# Patient Record
Sex: Male | Born: 1935 | Race: White | Hispanic: No | Marital: Married | Smoking: Former smoker
Health system: Southern US, Community
[De-identification: ages and names within clinical notes are randomized; demographics above are authoritative.]

## PROBLEM LIST (undated history)

## (undated) DIAGNOSIS — D126 Benign neoplasm of colon, unspecified: Secondary | ICD-10-CM

## (undated) DIAGNOSIS — F419 Anxiety disorder, unspecified: Secondary | ICD-10-CM

## (undated) DIAGNOSIS — M4800 Spinal stenosis, site unspecified: Secondary | ICD-10-CM

## (undated) DIAGNOSIS — C801 Malignant (primary) neoplasm, unspecified: Secondary | ICD-10-CM

## (undated) DIAGNOSIS — F4310 Post-traumatic stress disorder, unspecified: Secondary | ICD-10-CM

## (undated) DIAGNOSIS — I8390 Asymptomatic varicose veins of unspecified lower extremity: Secondary | ICD-10-CM

## (undated) DIAGNOSIS — I639 Cerebral infarction, unspecified: Secondary | ICD-10-CM

## (undated) DIAGNOSIS — I350 Nonrheumatic aortic (valve) stenosis: Secondary | ICD-10-CM

## (undated) DIAGNOSIS — K649 Unspecified hemorrhoids: Secondary | ICD-10-CM

## (undated) DIAGNOSIS — Z8619 Personal history of other infectious and parasitic diseases: Secondary | ICD-10-CM

## (undated) DIAGNOSIS — D569 Thalassemia, unspecified: Secondary | ICD-10-CM

## (undated) DIAGNOSIS — I10 Essential (primary) hypertension: Secondary | ICD-10-CM

## (undated) DIAGNOSIS — E785 Hyperlipidemia, unspecified: Secondary | ICD-10-CM

## (undated) DIAGNOSIS — Z860100 Personal history of colon polyps, unspecified: Secondary | ICD-10-CM

## (undated) DIAGNOSIS — K5730 Diverticulosis of large intestine without perforation or abscess without bleeding: Secondary | ICD-10-CM

## (undated) DIAGNOSIS — M353 Polymyalgia rheumatica: Secondary | ICD-10-CM

## (undated) DIAGNOSIS — I809 Phlebitis and thrombophlebitis of unspecified site: Secondary | ICD-10-CM

## (undated) HISTORY — DX: Nonrheumatic aortic (valve) stenosis: I35.0

## (undated) HISTORY — PX: CATARACT EXTRACTION, BILATERAL: SHX1313

## (undated) HISTORY — DX: Unspecified hemorrhoids: K64.9

## (undated) HISTORY — DX: Diverticulosis of large intestine without perforation or abscess without bleeding: K57.30

## (undated) HISTORY — DX: Post-traumatic stress disorder, unspecified: F43.10

## (undated) HISTORY — DX: Asymptomatic varicose veins of unspecified lower extremity: I83.90

## (undated) HISTORY — DX: Phlebitis and thrombophlebitis of unspecified site: I80.9

## (undated) HISTORY — DX: Anxiety disorder, unspecified: F41.9

## (undated) HISTORY — DX: Personal history of colon polyps, unspecified: Z86.0100

## (undated) HISTORY — DX: Personal history of other infectious and parasitic diseases: Z86.19

## (undated) HISTORY — PX: KIDNEY STONE SURGERY: SHX686

## (undated) HISTORY — DX: Polymyalgia rheumatica: M35.3

## (undated) HISTORY — DX: Thalassemia, unspecified: D56.9

## (undated) HISTORY — DX: Spinal stenosis, site unspecified: M48.00

## (undated) HISTORY — DX: Benign neoplasm of colon, unspecified: D12.6

## (undated) HISTORY — DX: Hyperlipidemia, unspecified: E78.5

## (undated) HISTORY — DX: Cerebral infarction, unspecified: I63.9

## (undated) HISTORY — DX: Malignant (primary) neoplasm, unspecified: C80.1

## (undated) HISTORY — DX: Essential (primary) hypertension: I10

## (undated) HISTORY — PX: HEMORROIDECTOMY: SUR656

## (undated) HISTORY — DX: Personal history of colonic polyps: Z86.010

---

## 2001-06-28 ENCOUNTER — Encounter: Payer: Self-pay | Admitting: Internal Medicine

## 2003-06-04 ENCOUNTER — Encounter: Payer: Self-pay | Admitting: Internal Medicine

## 2005-02-03 ENCOUNTER — Encounter: Payer: Self-pay | Admitting: Internal Medicine

## 2005-02-03 DIAGNOSIS — K649 Unspecified hemorrhoids: Secondary | ICD-10-CM

## 2005-02-03 DIAGNOSIS — K5730 Diverticulosis of large intestine without perforation or abscess without bleeding: Secondary | ICD-10-CM

## 2005-02-03 HISTORY — DX: Diverticulosis of large intestine without perforation or abscess without bleeding: K57.30

## 2005-02-03 HISTORY — DX: Unspecified hemorrhoids: K64.9

## 2005-03-01 ENCOUNTER — Encounter: Payer: Self-pay | Admitting: Internal Medicine

## 2006-10-05 ENCOUNTER — Ambulatory Visit: Payer: Self-pay | Admitting: *Deleted

## 2006-11-27 ENCOUNTER — Emergency Department (HOSPITAL_COMMUNITY): Admission: EM | Admit: 2006-11-27 | Discharge: 2006-11-28 | Payer: Self-pay | Admitting: Emergency Medicine

## 2007-03-23 ENCOUNTER — Telehealth: Payer: Self-pay | Admitting: Internal Medicine

## 2007-08-09 ENCOUNTER — Ambulatory Visit: Payer: Self-pay | Admitting: *Deleted

## 2008-04-23 LAB — HEMOGLOBIN A1C: Hgb A1c MFr Bld: 6.3 % — AB (ref 4.0–6.0)

## 2008-05-23 ENCOUNTER — Encounter: Admission: RE | Admit: 2008-05-23 | Discharge: 2008-05-23 | Payer: Self-pay | Admitting: Orthopedic Surgery

## 2008-08-14 ENCOUNTER — Ambulatory Visit: Payer: Self-pay | Admitting: *Deleted

## 2009-04-06 ENCOUNTER — Ambulatory Visit: Payer: Self-pay | Admitting: Internal Medicine

## 2009-04-06 DIAGNOSIS — R142 Eructation: Secondary | ICD-10-CM

## 2009-04-06 DIAGNOSIS — R143 Flatulence: Secondary | ICD-10-CM

## 2009-04-06 DIAGNOSIS — R1013 Epigastric pain: Secondary | ICD-10-CM

## 2009-04-06 DIAGNOSIS — R141 Gas pain: Secondary | ICD-10-CM

## 2009-04-06 DIAGNOSIS — R110 Nausea: Secondary | ICD-10-CM | POA: Insufficient documentation

## 2009-04-06 DIAGNOSIS — K219 Gastro-esophageal reflux disease without esophagitis: Secondary | ICD-10-CM | POA: Insufficient documentation

## 2009-07-31 ENCOUNTER — Ambulatory Visit: Payer: Self-pay | Admitting: Vascular Surgery

## 2010-04-21 NOTE — Assessment & Plan Note (Signed)
Summary: EPIGASTRIC PAIN/YF   History of Present Illness Visit Type: consult  Primary GI MD: Yancey Flemings MD Primary Provider: Barry Dienes. Eloise Harman, MD  Requesting Provider: Barry Dienes. Eloise Harman, MD  Chief Complaint: loss of appetite, acid reflux, and nausea  History of Present Illness:   77 year old white male with multiple medical problems including diabetes mellitus, hypertension, hyperlipidemia, spinal stenosis, anxiety/depression, irritable bowel syndrome, GERD, and nephrolithiasis. She has had an extensive previous GI history and multiple valuations while living in [place]. I have reviewed his extensive medical records. His last colonoscopy was performed in 2006 revealed a hyperplastic polyp only. His last upper endoscopy in 2005 revealed mild esophagitis. 4 GERD she is on AcipHex every other day. He is accompanied today by his wife who participates in the encounter. Patient's GI review of systems is essentially positive. He tells me that he has posttraumatic stress disorder and tells me that he feeds he might develop nausea and the sensation that he needs to belch. If he is unable to belch, he develops anxiety.Marland Kitchen He reports sensation of abdominal discomfort in the epigastric region, but he is vague. He says that he has had this for decades without change. He has problems with eating and increased intestinal gas about once per week. Last episode one week ago. He says he can have crying spells. He wants something to help his stomach. He states he has been on Wellbutrin which has not helped. He denies dysphagia or weight loss.Marland Kitchen His chronic medical problems are reported to be stable.   GI Review of Systems    Reports abdominal pain, acid reflux, belching, bloating, loss of appetite, and  nausea.     Location of  Abdominal pain: epigastric discomfort.    Denies chest pain, dysphagia with liquids, dysphagia with solids, heartburn, vomiting, vomiting blood, weight loss, and  weight gain.      Reports  constipation, diarrhea, hemorrhoids, irritable bowel syndrome, and  rectal bleeding.     Denies anal fissure, black tarry stools, change in bowel habit, diverticulosis, fecal incontinence, heme positive stool, jaundice, light color stool, liver problems, and  rectal pain.    Current Medications (verified): 1)  Aciphex 20 Mg Tbec (Rabeprazole Sodium) .Marland Kitchen.. 1 Tablet By Mouth Once Daily 2)  Actos 30 Mg Tabs (Pioglitazone Hcl) .Marland Kitchen.. 1 Tablet By Mouth Once Daily 3)  Aspirin 325 Mg Tabs (Aspirin) .Marland Kitchen.. 1 Tablet By Mouth Two Times A Day 4)  Colace 100 Mg Caps (Docusate Sodium) .... 2 Capsules By Mouth Once Daily 5)  Glucophage 1000 Mg Tabs (Metformin Hcl) .... 2 Tablets By Mouth Once Daily 6)  Lotrel 5-20 Mg Caps (Amlodipine Besy-Benazepril Hcl) .Marland Kitchen.. 1 Capsule By Mouth Once Daily 7)  Magnesium 500 Mg Tabs (Magnesium) .Marland Kitchen.. 1 Tablet By Mouth Once Daily 8)  Multivitamins  Tabs (Multiple Vitamin) .Marland Kitchen.. 1 Tablet By Mouth Once Daily 9)  Tricor 48 Mg Tabs (Fenofibrate) .Marland Kitchen.. 1 Tablet By Mouth Once Daily 10)  Wellbutrin 100 Mg Tabs (Bupropion Hcl) .... One Tablet By Mouth Once Daily 11)  Zocor 40 Mg Tabs (Simvastatin) .... One Tablet By Mouth Once Daily  Allergies (verified): 1)  ! * Lamisil  Past History:  Past Medical History: Hx of Lyme's Disease Rt. Tibia Fx. Diabetes Hypertension Hyperlipidemia Deafness Lt. Ear Severe Spinal Stenosis DDD C-Spine Hemorrhoids Nephrolithiasis Depression Pneumonia DVT GI Bleed Irritable Bowel Syndrome Anemia Anxiety Disorder  Past Surgical History: Reviewed history from 04/03/2009 and no changes required. Hemorrhoid Surgery Kidney Stone Surgery  Family History: No  FH of Colon Cancer:  Social History: Occupation: Retired Married Patient currently smokes: Cigar daily Alcohol Use - no Daily Caffeine Use: 2 daily  Illicit Drug Use - no Smoking Status:  current Drug Use:  no  Review of Systems       anxiety, arthritis, back pain, cataracts,  depression, skin rash. All other systems reviewed and reported to be negative.  Vital Signs:  Patient profile:   77 year old male Height:      67 inches Weight:      181 pounds BSA:     1.94 Pulse rate:   64 / minute Pulse rhythm:   regular BP sitting:   124 / 72  (left arm) Cuff size:   regular  Vitals Entered By: Ok Anis CMA (April 06, 2009 2:40 PM)  Physical Exam  General:  Well developed, well nourished, no acute distress. Head:  Normocephalic and atraumatic. Eyes:  PERRLA, no icterus. Ears:  hearing aids Nose:  No deformity, discharge,  or lesions. Mouth:  No deformity or lesions,  Neck:  Supple; no masses or thyromegaly. Lungs:  Clear throughout to auscultation. Heart:  Regular rate and rhythm; no murmurs, rubs,  or bruits. Abdomen:  Soft, nontender and nondistended. No masses, hepatosplenomegaly or hernias noted. Normal bowel sounds. Msk:  Symmetrical with no gross deformities. Normal posture. Pulses:  Normal pulses noted. Extremities:  No clubbing, cyanosis, edema or deformities noted. Neurologic:  Alert and  oriented x4;  grossly normal neurologically. Skin:  Intact without significant lesions or rashes. Psych:  Alert and cooperative. Normal mood and affect.   Impression & Recommendations:  Problem # 1:  FLATULENCE-GAS-BLOATING (ICD-47.50) 77 year old with chronic functional complaints who presents today at with intermittent nausea associated with gas and anxiety produced by the inability to belch.  Plan: #1. Librax one b.i.d. p.r.n. #2. Reassurance  Problem # 2:  GERD (ICD-530.81) GERD with endoscopic evidence of esophagitis on prior endoscopy.  Plan: #1. AcipHex 20 mg daily #2. Reflux precautions  Problem # 3:  NAUSEA (ICD-787.02) nausea is other functional or secondary to GERD. For Librax and more regular AcipHex as mentioned above. If symptoms persist despite this, consider upper endoscopy.  Problem # 4:  ABDOMINAL PAIN-EPIGASTRIC  (ICD-789.06) vague abdominal discomfort. Likely functional. Recommend Librax  Patient Instructions: 1)  Librax 1 by mouth two times a day #100 x 2 RFS Rx. printed and given to patient. 2)  Please schedule a follow-up appointment as needed.  3)  The medication list was reviewed and reconciled.  All changed / newly prescribed medications were explained.  A complete medication list was provided to the patient / caregiver. 4)  Dr. Jarome Matin Prescriptions: LIBRAX 2.5-5 MG CAPS (CLIDINIUM-CHLORDIAZEPOXIDE) 1 by mouth two times a day as needed  #100 x 2    Entered by:   Milford Cage NCMA   Authorized by:   Hilarie Fredrickson MD   Signed by:   Milford Cage NCMA on 04/06/2009   Method used:   Print then Give to Patient   RxID:   [id number]

## 2010-04-28 ENCOUNTER — Encounter: Payer: Self-pay | Admitting: Cardiology

## 2010-04-28 DIAGNOSIS — M4800 Spinal stenosis, site unspecified: Secondary | ICD-10-CM | POA: Insufficient documentation

## 2010-04-28 DIAGNOSIS — F4310 Post-traumatic stress disorder, unspecified: Secondary | ICD-10-CM | POA: Insufficient documentation

## 2010-04-28 DIAGNOSIS — Z8619 Personal history of other infectious and parasitic diseases: Secondary | ICD-10-CM | POA: Insufficient documentation

## 2010-04-28 DIAGNOSIS — E119 Type 2 diabetes mellitus without complications: Secondary | ICD-10-CM | POA: Insufficient documentation

## 2010-04-28 DIAGNOSIS — E785 Hyperlipidemia, unspecified: Secondary | ICD-10-CM | POA: Insufficient documentation

## 2010-04-28 DIAGNOSIS — D569 Thalassemia, unspecified: Secondary | ICD-10-CM | POA: Insufficient documentation

## 2010-04-28 DIAGNOSIS — I10 Essential (primary) hypertension: Secondary | ICD-10-CM | POA: Insufficient documentation

## 2010-05-28 ENCOUNTER — Encounter (INDEPENDENT_AMBULATORY_CARE_PROVIDER_SITE_OTHER): Payer: Medicare Other

## 2010-05-28 DIAGNOSIS — I872 Venous insufficiency (chronic) (peripheral): Secondary | ICD-10-CM

## 2010-06-02 NOTE — Procedures (Unsigned)
DUPLEX DEEP VENOUS EXAM - LOWER EXTREMITY  INDICATION:  Chronic venous insufficiency.  HISTORY:  Edema:  No. Trauma/Surgery:  No. Pain:  Yes. PE:  No. Previous DVT:  No. Anticoagulants:  325 mg aspirin x1 day. Other:  DUPLEX EXAM:               CFV   SFV   PopV  PTV    GSV               R  L  R  L  R  L  R   L  R  L Thrombosis    o  o     o     o      o     o Spontaneous   +  +     +     +      +     + Phasic        +  +     +     +      +     + Augmentation  +  +     +     +      +     + Compressible  +  +     +     +      +     + Competent     +  +     +     +      +     o  Legend:  + - yes  o - no  p - partial  D - decreased  IMPRESSION:  No evidence of acute deep vein thrombosis or superficial thrombophlebitis within the left lower extremity.  Venous insufficiency within the left greater saphenous in the deep venous system.   _____________________________ Larina Earthly, M.D.  OD/MEDQ  D:  05/28/2010  T:  05/28/2010  Job:  [id number]

## 2010-06-17 ENCOUNTER — Ambulatory Visit: Payer: Self-pay | Admitting: Cardiology

## 2010-06-22 ENCOUNTER — Ambulatory Visit (INDEPENDENT_AMBULATORY_CARE_PROVIDER_SITE_OTHER): Payer: Medicare Other | Admitting: Cardiology

## 2010-06-22 DIAGNOSIS — I10 Essential (primary) hypertension: Secondary | ICD-10-CM

## 2010-06-22 DIAGNOSIS — E119 Type 2 diabetes mellitus without complications: Secondary | ICD-10-CM

## 2010-06-23 ENCOUNTER — Encounter: Payer: Self-pay | Admitting: Cardiology

## 2010-06-23 NOTE — Progress Notes (Signed)
Subjective:    Antonio Buchanan is seen today with his wife. Overall, he continues to do well. His main problem is related to leg discomfort from his spinal stenosis. He exercises regularly and is proud of his upper body strength. He has a history of diabetes as well as lipid abnormalities. He has underlying hypertension as been well-controlled at home. He has no specific complaint other than left leg pain and weakness.pressure readings at home are 120/68 Current Outpatient Prescriptions  Medication Sig Dispense Refill  . amLODipine-benazepril (LOTREL) 5-10 MG per capsule Take 1 capsule by mouth daily.        Marland Kitchen aspirin 325 MG tablet Take 325 mg by mouth 2 (two) times daily.        Tery Sanfilippo Calcium (STOOL SOFTENER PO) Take by mouth daily.        . fenofibrate (TRICOR) 145 MG tablet Take 145 mg by mouth daily.        . fish oil-omega-3 fatty acids 1000 MG capsule Take 1 g by mouth daily.        Marland Kitchen MAGNESIUM PO Take by mouth daily.        . metFORMIN (GLUCOPHAGE) 1000 MG tablet Take 2,000 mg by mouth daily.        . Multiple Vitamin (MULTIVITAMIN) tablet Take 1 tablet by mouth daily.        . Multiple Vitamins-Minerals (ZINC PO) Take by mouth daily.        . pioglitazone (ACTOS) 30 MG tablet Take 30 mg by mouth daily.        . Psyllium (METAMUCIL PO) Take by mouth daily.        . Pyridoxine HCl (VITAMIN B6 PO) Take by mouth daily.        . simvastatin (ZOCOR) 40 MG tablet Take 20 mg by mouth at bedtime.          Not on File  Patient Active Problem List  Diagnoses  . GERD  . NAUSEA  . FLATULENCE-GAS-BLOATING  . ABDOMINAL PAIN-EPIGASTRIC  . Diabetes mellitus  . HTN (hypertension)  . Hyperlipemia  . Spinal stenosis  . Thalassemia  . History of Lyme disease  . PTSD (post-traumatic stress disorder)    History  Smoking status  . Not on file  Smokeless tobacco  . Not on file    History  Alcohol Use     Family History  Problem Relation Age of Onset  . Diabetes Father   . Leukemia  Mother   . Hypertension Mother   . Arthritis Mother     Review of Systems:   The patient denies any heat or cold intolerance.  No weight gain or weight loss.  The patient denies headaches or blurry vision.  There is no cough or sputum production.  The patient denies dizziness.  There is no hematuria or hematochezia.  The patient denies any muscle aches or arthritis.  The patient denies any rash.  The patient denies frequent falling or instability.  There is no history of depression or anxiety.  All other systems were reviewed and are negative.   Physical Exam:   Weight is 182. Blood pressure 158/88. Heart rate 76.The head is normocephalic and atraumatic.  Pupils are equally round and reactive to light.  Sclerae nonicteric.  Conjunctiva is clear.  Oropharynx is unremarkable.  There's adequate oral airway.  Neck is supple there are no masses.  Thyroid is not enlarged.  There is no lymphadenopathy.  Lungs are clear.  Chest is symmetric.  Heart shows a regular rate and rhythm.  S1 and S2 are normal.  There is no murmur click or gallop.  Abdomen is soft normal bowel sounds.  There is no organomegaly.  Genital and rectal deferred.  Extremities are without edema.  Peripheral pulses are adequate.  Neurologically intact.  Full range of motion.  The patient is not depressed.  Skin is warm and dry. Assessment / Plan:

## 2010-06-23 NOTE — Assessment & Plan Note (Signed)
Pressure readings are satisfactory. I will have continued followup with Dr. Eloise Harman

## 2010-06-23 NOTE — Assessment & Plan Note (Signed)
Hemoglobin A1c was 6.2. Continue follow with Dr. Jarold Motto.

## 2010-07-09 ENCOUNTER — Encounter: Payer: Self-pay | Admitting: Cardiology

## 2010-08-03 NOTE — Assessment & Plan Note (Signed)
OFFICE VISIT   TRONG, GOSLING  DOB:  01-08-1922                                       07/31/2009  EAVWU#:[id number]   He was formerly a patient of Dr. Madilyn Fireman and has not seen a physician  since.  Reason for followup is chronic venous insufficiency.   The patient is an 77 year old Caucasian male who is here today for  yearly followup for his history of bilateral lower extremity chronic  venous insufficiency, worse on the left leg.  He has been seen on a  yearly basis at least since 2008 and had been followed by Dr. Denman George who recently relocated.  The patient reports that in November of  1983 he had a crush injury to his left lower extremity and in January  the following year he had significant left leg ulcers which have since  healed.  He has a fair amount of pain in his legs particularly on the  left but reports that he consistently wears his knee high compression  stockings at 20-30 mmHg which do help with his symptoms.  He has had no  new ulcerations or erythema.   PAST MEDICAL HISTORY:  Significant for diabetes mellitus type 2,  hypertension, coronary artery disease with history of myocardial  infarction.  He is followed by Dr. Roger Shelter.  Hypercholesterolemia, two recent cataract surgeries this year,  hemorrhoid surgery in the '60s, nephrolithiasis, staph infection of his  left lower extremity in 1984, embedded shrapnel in 1944 during World War  II.   FAMILY HISTORY:  He denies any premature cardiovascular disease in his  family.   SOCIAL HISTORY:  He is married with two children.  He previously worked  as a Research scientist (physical sciences).  He quit smoking in 1995 and does not drink alcohol  on a regular basis.   REVIEW OF SYSTEMS:  Positive for pain in his legs with walking and pain  in his left foot with lying flat.  He has arthritic pain and post  traumatic stress disorder from World War II, problems with anxiety and  depression.  He  has also has a history of thalassemia.   PHYSICAL EXAM:  General:  Findings show an 77 year old male who appears  his stated age.  Vital signs:  His heart rate is 63, blood pressure in  his right arm 160/84, in his left arm 163/79, respirations are 20.  He  is alert and oriented.  Heart:  His heart has a regular rate and rhythm.  Lungs:  Sounds were clear bilaterally.  Abdomen:  Was soft, nontender.  Extremities:  His bilateral lower extremities show warm feet with 1+  dorsalis pedis pulses bilaterally.  He has bilateral lower extremity  varicosities but more severe in the left leg with a particularly large  varicosity running on his medial calf on the left.  There was no  erythema on either leg.  He has chronic pigmentation to bilateral lower  extremities with some dry skin as well.  Brawny edema was noted.  There  were no ulcerations appreciated.  His left lower extremity has greater  generalized edema than on the right.   The patient continues to suffer with chronic venous insufficiency of his  lower extremities with moderate symptoms in his right leg and at least  moderate to severe symptoms in his left leg.  He remains significantly  disabled with his above symptoms and this continues to limit his  employment ability.  I recommend that he continue to use his compression  stockings and to avoid prolonged standing and elevate his legs while in  bed or in a reclined position.  We will plan for him to be seen again in  1 year.  He should call sooner if worsening symptoms or symptoms of  cellulitis develop.   Jerold Coombe, PA   Larina Earthly, M.D.  Electronically Signed   AWZ/MEDQ  D:  07/31/2009  T:  07/31/2009  Job:  [id number]   cc:   Barry Dienes. Eloise Harman, M.D.  Workman's Comp

## 2010-08-03 NOTE — Assessment & Plan Note (Signed)
OFFICE VISIT   Antonio Buchanan, Antonio Buchanan  DOB:  01-Sep-1921                                       08/09/2007  [id number]#:[id number]   The patient is an 77 year old gentleman with a history of bilateral  lower extremity chronic venous insufficiency.  He was seen and evaluated  in this office on 10/05/2006 and was felt to be disabled secondary  bilateral chronic venous insufficiency of the lower extremities.  He was  given a prescription for compression stockings 20-30 mmHg below-knee  bilaterally.   Chronic underlying conditions include type 2 diabetes, hypertension and  hyperlipidemia.   The patient suffers from post-lytic syndrome most severely effecting his  left lower extremity.  He is taking good care of his legs with venous  compression stockings and hygienic care.  He has a history of DVT left  leg.   He is an 77 year old gentleman alert and oriented.  Appears his stated  age.  No acute distress.  BP 156/83, pulse 63 per minute regular,  respirations 18 per minute.  His lower extremities reveal intact  popliteal and posterior tibial pulses.  He does have extensive  varicosities noted bilaterally.  Mild pigmentation of the right lower  leg.  Moderate pigmentation left lower leg with some brawny edema and  chronic skin changes.   In summary, the patient suffers from chronic venous insufficiency of the  lower extremities with moderate to severe left leg and moderate right  leg.  These remain significantly disabling and limit his employment  ability.   He will continue current level of care and return to the office in 1  year.   Balinda Quails, M.D.  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 of Labor

## 2010-08-03 NOTE — Assessment & Plan Note (Signed)
OFFICE VISIT   PACE, LAMADRID  DOB:  Jun 11, 1921                                       08/14/2008  [id number]#:[id number]   The patient returned to the office today for yearly follow-up with a  history of bilateral lower extremity chronic venous insufficiency.  He  is seen on a yearly basis since 2008.  He remains on long-term  disability secondary to bilateral chronic venous insufficiency of the  lower extremities.  He continues to wear compression stockings at 20-30  mmHg below the knee bilaterally.   PAST MEDICAL HISTORY:  Type 2 diabetes, hypertension and hyperlipidemia.   He is an 77 year old gentleman who appears approximately his stated age.  Alert and oriented.  No distress.  Blood pressure 176/75, pulse 66 per  minute.  His lower extremities reveal intact popliteal and posterior  tibial pulses.  He has extensive varicosities noted bilaterally, most  severely affecting the left leg.  He has chronic pigmentation of the  lower extremities bilaterally.  Brawny edema noted and chronic skin  changes.  No active ulceration.   The patient has continued problems with chronic venous insufficiency and  remains disabled related to this.  Will plan follow-up again in 1 year.   Balinda Quails, M.D.  Electronically Signed   PGH/MEDQ  D:  08/14/2008  T:  08/15/2008  Job:  [id number]   cc:   Workman's Comp Programs

## 2010-08-03 NOTE — Consult Note (Signed)
VASCULAR SURGERY CONSULTATION   Antonio Buchanan, Antonio Buchanan  DOB:  09-04-1921                                       10/05/2006  [id number]#:[id number]   The referring physician is Antonio Buchanan. Antonio Buchanan, M.D., [name], [place].   REASON FOR CONSULTATION:  Chronic venous insufficiency bilateral lower  extremities.   HISTORY:  Mr. Antonio Buchanan has a history of longstanding chronic venous  insufficiency dating back several years.  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  This has  been reasonably controlled with venous compression stocking and good  hygienic care.   His history dates back to a DVT involving his left leg.  He wears a  venous compression stocking religiously.   PAST MEDICAL HISTORY:  1. Type 2 diabetes.  2. Hypertension.  3. Hyperlipidemia.   SOCIAL HISTORY:  The patient is married with 2 grown children.  Relocated here to [facility] to be with his children and grandchildren.  Does not smoke.  Discontinued tobacco use in 1997.  No alcohol use on a  regular basis.   REVIEW OF SYSTEMS:  Denies weight loss or anorexia.  No chest pain or  shortness of breath.  No cough or sputum production.  Denies change in  bowel habits.  He does have chronic pain in his legs.  Chronic joint  discomfort and muscle pain.   MEDICATIONS:  1. Metformin 2 g daily.  2. Lotrel 5/10 daily.  3. Zocor 40 mg daily.  4. Actos 30 mg daily.  5. TriCor 48 mg daily.  6. Aspirin 325 mg daily.   ALLERGIES:  None known.   FAMILY HISTORY:  Noncontributory.   PHYSICAL EXAMINATION:  An 77 year old male, appears approximately his  stated age, alert and oriented.  Vital Signs:  The BP 161/82, pulse 56  per minute.  Respirations 18 per minute.  Lower Extremities:  Intact  posterior tibial and pedal pulses bilaterally.  Feet are well perfused  with brisk capillary refill.  There is pigmentation noted about the  ankles bilaterally.  There  is trophic skin changes more severely  affecting the left leg.  No active ulceration.  Mild dry skin.  Varicosities are noted 7-10 mm in diameter.  No erythema.   IMPRESSION:  1. Longstanding chronic venous insufficiency secondary to remote deep      vein thrombosis.  2. Type 2 diabetes.  3. Hypertension.  4. Hyperlipidemia.   DISPOSITION:  The patient remains disabled secondary to chronic venous  insufficiency.  Will plan yearly followup.  Patient given a prescription  for new venous compression stocking to be refitted at 20-30 mmHg  bilaterally below the knee.   Antonio Buchanan, M.D.  Electronically Signed  PGH/MEDQ  D:  10/05/2006  T:  10/06/2006  Job:  [id number]   cc:   [employer]

## 2010-08-10 ENCOUNTER — Ambulatory Visit (INDEPENDENT_AMBULATORY_CARE_PROVIDER_SITE_OTHER): Payer: Medicare Other | Admitting: Vascular Surgery

## 2010-08-10 DIAGNOSIS — I872 Venous insufficiency (chronic) (peripheral): Secondary | ICD-10-CM

## 2010-08-10 NOTE — Assessment & Plan Note (Signed)
OFFICE VISIT  KELVON, GIANNINI DOB:  1922-01-24                                       08/10/2010 [id number]#:[id number]  Patient presents today for determination of ongoing disability.  This is a formality for an office visit.  He has had a longstanding disability due to chronic uncorrectable venous hypertension and apparently needs to see a physician on a yearly basis for [facility] Comp documentation.  He continues to be quite active despite his age of 52.  He has had no change from the standpoint of his venous hypertension and venous stasis disease.  This is my last visit with him.  I have reviewed his reports from the last several years.  He did undergo a duplex in our office on 05/28/10 due to some pain in his left thigh and was concerned about DVT.  This study was negative for DVT.  He does continue to have very good compliance with his graduated compression garment the left leg.  He does have changes of venous hypertension but no active ulcers.  I feel that he has had no change in his status of disability related to his chronic venous stasis disease, and he will see [place] on an as-needed basis.    Larina Earthly, M.D. Electronically Signed  TFE/MEDQ  D:  08/10/2010  T:  08/10/2010  Job:  [id number]

## 2011-02-24 ENCOUNTER — Ambulatory Visit (INDEPENDENT_AMBULATORY_CARE_PROVIDER_SITE_OTHER): Payer: Medicare Other | Admitting: Cardiology

## 2011-02-24 ENCOUNTER — Encounter: Payer: Self-pay | Admitting: Cardiology

## 2011-02-24 VITALS — BP 143/82 | HR 63 | Wt 176.0 lb

## 2011-02-24 DIAGNOSIS — E785 Hyperlipidemia, unspecified: Secondary | ICD-10-CM

## 2011-02-24 DIAGNOSIS — F17200 Nicotine dependence, unspecified, uncomplicated: Secondary | ICD-10-CM

## 2011-02-24 DIAGNOSIS — R011 Cardiac murmur, unspecified: Secondary | ICD-10-CM | POA: Insufficient documentation

## 2011-02-24 DIAGNOSIS — I10 Essential (primary) hypertension: Secondary | ICD-10-CM

## 2011-02-24 DIAGNOSIS — Z720 Tobacco use: Secondary | ICD-10-CM | POA: Insufficient documentation

## 2011-02-24 NOTE — Assessment & Plan Note (Signed)
Probable aortic stenosis murmur on examination. Scheduled echocardiogram.

## 2011-02-24 NOTE — Assessment & Plan Note (Signed)
Continue statin. Lipids and liver monitored by primary care. 

## 2011-02-24 NOTE — Progress Notes (Signed)
HPI:77 yo male formerly followed by Dr Deborah Chalk for fu of hypertension. Echo in 2003 showed normal LV function. Myoview in 2008 showed EF 59 and normal perfusion. Patient last seen in April of 2012. Since then, the patient denies any dyspnea on exertion, orthopnea, PND, palpitations, syncope or chest pain. Some pedal edema from venous insufficiency.   Current Outpatient Prescriptions  Medication Sig Dispense Refill  . amLODipine-benazepril (LOTREL) 5-10 MG per capsule Take 1 capsule by mouth daily.        Marland Kitchen aspirin 325 MG tablet Take 325 mg by mouth 2 (two) times daily.        Marland Kitchen atenolol (TENORMIN) 25 MG tablet Take 25 mg by mouth as needed.        Marland Kitchen buPROPion (WELLBUTRIN) 100 MG tablet Take 100 mg by mouth daily.        . clonazePAM (KLONOPIN) 1 MG tablet Take 1 mg by mouth as needed.        Tery Sanfilippo Calcium (STOOL SOFTENER PO) Take by mouth daily.        . fenofibrate (TRICOR) 48 MG tablet Take 48 mg by mouth daily.        . fish oil-omega-3 fatty acids 1000 MG capsule Take 1 g by mouth daily.        Marland Kitchen MAGNESIUM PO Take by mouth daily.        . metFORMIN (GLUCOPHAGE) 1000 MG tablet Take 2,000 mg by mouth daily.        . Multiple Vitamin (MULTIVITAMIN) tablet Take 1 tablet by mouth daily.        . Multiple Vitamins-Minerals (ZINC PO) Take by mouth daily.        . Pyridoxine HCl (VITAMIN B6 PO) Take by mouth daily.        . RABEprazole (ACIPHEX) 20 MG tablet Take 20 mg by mouth daily.        . simvastatin (ZOCOR) 40 MG tablet Take 20 mg by mouth at bedtime.           Past Medical History  Diagnosis Date  . Diabetes mellitus   . HTN (hypertension)   . Hyperlipemia   . Spinal stenosis   . Thalassemia   . History of Lyme disease   . PTSD (post-traumatic stress disorder)     Past Surgical History  Procedure Date  . Cataract extraction, bilateral   . Kidney stone surgery   . Hemorroidectomy     History   Social History  . Marital Status: Married    Spouse Name: N/A   Number of Children: N/A  . Years of Education: N/A   Occupational History  . Not on file.   Social History Main Topics  . Smoking status: Current Everyday Smoker    Types: Cigars  . Smokeless tobacco: Not on file  . Alcohol Use: Not on file  . Drug Use: Not on file  . Sexually Active: Not on file   Other Topics Concern  . Not on file   Social History Narrative  . No narrative on file    ROS: Leg weakness but no fevers or chills, productive cough, hemoptysis, dysphasia, odynophagia, melena, hematochezia, dysuria, hematuria, rash, seizure activity, orthopnea, PND, claudication. Remaining systems are negative.  Physical Exam: Well-developed well-nourished in no acute distress.  Skin is warm and dry.  HEENT is normal.  Neck is supple. No thyromegaly.  Chest is clear to auscultation with normal expansion.  Cardiovascular exam is regular rate and rhythm. 2/6 systolic  murmur left sternal border Abdominal exam nontender or distended. No masses palpated. Extremities show trace edema. neuro grossly intact  ECG Sinus with occasional PVCs, LAD, Nonspecific ST changes

## 2011-02-24 NOTE — Patient Instructions (Signed)
Your physician has requested that you have an echocardiogram. Echocardiography is a painless test that uses sound waves to create images of your heart. It provides your doctor with information about the size and shape of your heart and how well your heart's chambers and valves are working. This procedure takes approximately one hour. There are no restrictions for this procedure.  Your physician wants you to follow-up in: 12 months You will receive a reminder letter in the mail two months in advance. If you don't receive a letter, please call our office to schedule the follow-up appointment.  

## 2011-02-24 NOTE — Assessment & Plan Note (Signed)
Patient counseled on avoiding cigar use.

## 2011-02-24 NOTE — Assessment & Plan Note (Signed)
Blood pressure is reasonably well controlled. He is only taking atenolol every other day. I have asked him to take this daily. Potassium and renal function monitored by primary care.

## 2011-03-01 ENCOUNTER — Ambulatory Visit (HOSPITAL_COMMUNITY): Payer: Medicare Other | Attending: Cardiology | Admitting: Radiology

## 2011-03-01 DIAGNOSIS — I079 Rheumatic tricuspid valve disease, unspecified: Secondary | ICD-10-CM | POA: Insufficient documentation

## 2011-03-01 DIAGNOSIS — R011 Cardiac murmur, unspecified: Secondary | ICD-10-CM | POA: Insufficient documentation

## 2011-03-01 DIAGNOSIS — F17200 Nicotine dependence, unspecified, uncomplicated: Secondary | ICD-10-CM | POA: Insufficient documentation

## 2011-03-01 DIAGNOSIS — E785 Hyperlipidemia, unspecified: Secondary | ICD-10-CM | POA: Insufficient documentation

## 2011-03-01 DIAGNOSIS — I10 Essential (primary) hypertension: Secondary | ICD-10-CM | POA: Insufficient documentation

## 2011-03-01 DIAGNOSIS — I359 Nonrheumatic aortic valve disorder, unspecified: Secondary | ICD-10-CM | POA: Insufficient documentation

## 2011-03-01 DIAGNOSIS — E119 Type 2 diabetes mellitus without complications: Secondary | ICD-10-CM | POA: Insufficient documentation

## 2011-03-01 DIAGNOSIS — I059 Rheumatic mitral valve disease, unspecified: Secondary | ICD-10-CM | POA: Insufficient documentation

## 2011-05-05 DIAGNOSIS — M48061 Spinal stenosis, lumbar region without neurogenic claudication: Secondary | ICD-10-CM | POA: Diagnosis not present

## 2011-05-05 DIAGNOSIS — K5900 Constipation, unspecified: Secondary | ICD-10-CM | POA: Diagnosis not present

## 2011-05-05 DIAGNOSIS — E1159 Type 2 diabetes mellitus with other circulatory complications: Secondary | ICD-10-CM | POA: Diagnosis not present

## 2011-05-05 DIAGNOSIS — I10 Essential (primary) hypertension: Secondary | ICD-10-CM | POA: Diagnosis not present

## 2011-05-27 DIAGNOSIS — L608 Other nail disorders: Secondary | ICD-10-CM | POA: Diagnosis not present

## 2011-05-27 DIAGNOSIS — E1149 Type 2 diabetes mellitus with other diabetic neurological complication: Secondary | ICD-10-CM | POA: Diagnosis not present

## 2011-07-11 ENCOUNTER — Telehealth: Payer: Self-pay | Admitting: Vascular Surgery

## 2011-07-11 NOTE — Telephone Encounter (Signed)
Antonio Buchanan needs a 1 year appointment to satisfy Workman's Comp - he is still on federal workman's comp, despite the number of years that he has been retired.Marland Kitchen  He needs a short letter generated at this appointment to, basically, say that his status has not changed.  Please schedule with Antonio Buchanan and leave a message with the appointment and date.  They will call back if they need to change the time or date.  According to Mrs. Colvard, Medicare is billed for the appointment charge.  He does not get an ultrasound, each year.

## 2011-08-08 ENCOUNTER — Encounter: Payer: Self-pay | Admitting: Vascular Surgery

## 2011-08-09 ENCOUNTER — Ambulatory Visit (INDEPENDENT_AMBULATORY_CARE_PROVIDER_SITE_OTHER): Payer: Medicare Other | Admitting: Vascular Surgery

## 2011-08-09 ENCOUNTER — Encounter: Payer: Self-pay | Admitting: Vascular Surgery

## 2011-08-09 VITALS — BP 174/74 | HR 64 | Temp 98.3°F | Ht 66.5 in | Wt 176.0 lb

## 2011-08-09 DIAGNOSIS — I87309 Chronic venous hypertension (idiopathic) without complications of unspecified lower extremity: Secondary | ICD-10-CM | POA: Diagnosis not present

## 2011-08-09 NOTE — Progress Notes (Signed)
The patient presents today for documentation for workmen compensation purpose of his ongoing) correctable venous hypertension. He does require physician visit once a year to confirm this. He is quite active. He is now 78 years of age. He reports no new difficulty. He does have marked changes of venous hypertension in his left leg with hemosiderin deposits swelling marked venous varicosities. He does not have any active ulcers but does have very friable skin. He does not have any recent duplexes most recently being a year ago showing no evidence of acute DVT. He does move recently certified as posttraumatic stress disorder secondary to exposure during World War II. He reports no other health problems.  Impression and plan chronic left leg venous hypertension and venous stasis disease. He will continue his use of compression garment. He has very compliant with this and does elevate his leg when possible

## 2011-08-17 NOTE — Telephone Encounter (Signed)
Appt was scheduled.

## 2011-08-18 DIAGNOSIS — I739 Peripheral vascular disease, unspecified: Secondary | ICD-10-CM | POA: Diagnosis not present

## 2011-08-18 DIAGNOSIS — E1159 Type 2 diabetes mellitus with other circulatory complications: Secondary | ICD-10-CM | POA: Diagnosis not present

## 2011-08-18 DIAGNOSIS — I10 Essential (primary) hypertension: Secondary | ICD-10-CM | POA: Diagnosis not present

## 2011-08-18 DIAGNOSIS — L600 Ingrowing nail: Secondary | ICD-10-CM | POA: Diagnosis not present

## 2011-09-15 DIAGNOSIS — L408 Other psoriasis: Secondary | ICD-10-CM | POA: Diagnosis not present

## 2011-09-15 DIAGNOSIS — D239 Other benign neoplasm of skin, unspecified: Secondary | ICD-10-CM | POA: Diagnosis not present

## 2011-09-27 DIAGNOSIS — L608 Other nail disorders: Secondary | ICD-10-CM | POA: Diagnosis not present

## 2011-09-27 DIAGNOSIS — E1149 Type 2 diabetes mellitus with other diabetic neurological complication: Secondary | ICD-10-CM | POA: Diagnosis not present

## 2011-12-02 DIAGNOSIS — Q828 Other specified congenital malformations of skin: Secondary | ICD-10-CM | POA: Diagnosis not present

## 2011-12-02 DIAGNOSIS — Z23 Encounter for immunization: Secondary | ICD-10-CM | POA: Diagnosis not present

## 2011-12-02 DIAGNOSIS — I10 Essential (primary) hypertension: Secondary | ICD-10-CM | POA: Diagnosis not present

## 2011-12-02 DIAGNOSIS — L608 Other nail disorders: Secondary | ICD-10-CM | POA: Diagnosis not present

## 2011-12-02 DIAGNOSIS — E785 Hyperlipidemia, unspecified: Secondary | ICD-10-CM | POA: Diagnosis not present

## 2011-12-02 DIAGNOSIS — E1149 Type 2 diabetes mellitus with other diabetic neurological complication: Secondary | ICD-10-CM | POA: Diagnosis not present

## 2011-12-02 DIAGNOSIS — E1159 Type 2 diabetes mellitus with other circulatory complications: Secondary | ICD-10-CM | POA: Diagnosis not present

## 2011-12-22 DIAGNOSIS — L408 Other psoriasis: Secondary | ICD-10-CM | POA: Diagnosis not present

## 2011-12-22 DIAGNOSIS — L738 Other specified follicular disorders: Secondary | ICD-10-CM | POA: Diagnosis not present

## 2011-12-22 DIAGNOSIS — Z85828 Personal history of other malignant neoplasm of skin: Secondary | ICD-10-CM | POA: Diagnosis not present

## 2012-01-12 DIAGNOSIS — H31009 Unspecified chorioretinal scars, unspecified eye: Secondary | ICD-10-CM | POA: Diagnosis not present

## 2012-01-12 DIAGNOSIS — E119 Type 2 diabetes mellitus without complications: Secondary | ICD-10-CM | POA: Diagnosis not present

## 2012-01-12 DIAGNOSIS — D3130 Benign neoplasm of unspecified choroid: Secondary | ICD-10-CM | POA: Diagnosis not present

## 2012-01-12 DIAGNOSIS — H3530 Unspecified macular degeneration: Secondary | ICD-10-CM | POA: Diagnosis not present

## 2012-02-24 DIAGNOSIS — L608 Other nail disorders: Secondary | ICD-10-CM | POA: Diagnosis not present

## 2012-02-24 DIAGNOSIS — E1149 Type 2 diabetes mellitus with other diabetic neurological complication: Secondary | ICD-10-CM | POA: Diagnosis not present

## 2012-02-24 DIAGNOSIS — M2040 Other hammer toe(s) (acquired), unspecified foot: Secondary | ICD-10-CM | POA: Diagnosis not present

## 2012-03-01 ENCOUNTER — Encounter: Payer: Self-pay | Admitting: Cardiology

## 2012-03-01 ENCOUNTER — Ambulatory Visit (INDEPENDENT_AMBULATORY_CARE_PROVIDER_SITE_OTHER): Payer: Medicare Other | Admitting: Cardiology

## 2012-03-01 VITALS — BP 130/70 | HR 54 | Wt 173.0 lb

## 2012-03-01 DIAGNOSIS — I350 Nonrheumatic aortic (valve) stenosis: Secondary | ICD-10-CM | POA: Insufficient documentation

## 2012-03-01 DIAGNOSIS — E7800 Pure hypercholesterolemia, unspecified: Secondary | ICD-10-CM | POA: Diagnosis not present

## 2012-03-01 DIAGNOSIS — I10 Essential (primary) hypertension: Secondary | ICD-10-CM | POA: Diagnosis not present

## 2012-03-01 DIAGNOSIS — I359 Nonrheumatic aortic valve disorder, unspecified: Secondary | ICD-10-CM

## 2012-03-01 DIAGNOSIS — F17200 Nicotine dependence, unspecified, uncomplicated: Secondary | ICD-10-CM | POA: Diagnosis not present

## 2012-03-01 DIAGNOSIS — Z720 Tobacco use: Secondary | ICD-10-CM

## 2012-03-01 LAB — LIPID PANEL
Cholesterol: 175 mg/dL (ref 0–200)
HDL: 37.6 mg/dL — ABNORMAL LOW (ref >39.00)

## 2012-03-01 LAB — LDL CHOLESTEROL, DIRECT: Direct LDL: 102.9 mg/dL

## 2012-03-01 LAB — HEPATIC FUNCTION PANEL
ALT: 15 U/L (ref 0–53)
Albumin: 4.6 g/dL (ref 3.5–5.2)
Alkaline Phosphatase: 78 U/L (ref 39–117)
Bilirubin, Direct: 0.1 mg/dL (ref 0.0–0.3)
Total Protein: 7.5 g/dL (ref 6.0–8.3)

## 2012-03-01 LAB — BASIC METABOLIC PANEL
BUN: 20 mg/dL (ref 6–23)
Creatinine, Ser: 1.2 mg/dL (ref 0.4–1.5)
GFR: 61.83 mL/min (ref >60.00)

## 2012-03-01 NOTE — Patient Instructions (Addendum)
Your physician wants you to follow-up in: ONE YEAR WITH DR CRENSHAW You will receive a reminder letter in the mail two months in advance. If you don't receive a letter, please call our office to schedule the follow-up appointment.   Your physician recommends that you HAVE LAB WORK TODAY 

## 2012-03-01 NOTE — Assessment & Plan Note (Signed)
Blood pressure controlled. Continue present medications. Check potassium and renal function. 

## 2012-03-01 NOTE — Assessment & Plan Note (Signed)
Mild on most recent echo. Plan repeat study when he returns in one year.

## 2012-03-01 NOTE — Progress Notes (Signed)
HPI: Pleasant male for fu of hypertension. Myoview in 2008 showed EF 59 and normal perfusion. Echocardiogram in December of 2012 showed an ejection fraction of 50-55%, mild left ventricular hypertrophy, grade 1 diastolic dysfunction, mild aortic stenosis (mean gradient 16 mmHg) and insufficiency, mild mitral regurgitation. Patient last seen in Dec 2012. Since then, the patient denies any dyspnea on exertion, orthopnea, PND, palpitations, syncope or chest pain. Some pedal edema from venous insufficiency.   Current Outpatient Prescriptions  Medication Sig Dispense Refill  . amLODipine-benazepril (LOTREL) 5-10 MG per capsule Take 1 capsule by mouth daily.        Marland Kitchen atenolol (TENORMIN) 25 MG tablet Take 25 mg by mouth as needed.        Marland Kitchen buPROPion (WELLBUTRIN) 100 MG tablet 1/2 tab po qd      . clonazePAM (KLONOPIN) 1 MG tablet Take 1 mg by mouth as needed.        Tery Sanfilippo Calcium (STOOL SOFTENER PO) Take by mouth daily.        . fenofibrate (TRICOR) 48 MG tablet 1/2 tab po qd      . fish oil-omega-3 fatty acids 1000 MG capsule Take 1 g by mouth daily.        Marland Kitchen MAGNESIUM PO Take by mouth daily.        . metFORMIN (GLUCOPHAGE) 1000 MG tablet Take 2,000 mg by mouth daily.        . Multiple Vitamin (MULTIVITAMIN) tablet Take 1 tablet by mouth daily.        . Multiple Vitamins-Minerals (ZINC PO) Take by mouth daily.        . Pyridoxine HCl (VITAMIN B6 PO) Take by mouth daily.        . RABEprazole (ACIPHEX) 20 MG tablet Take 20 mg by mouth daily.        . simvastatin (ZOCOR) 40 MG tablet Take 20 mg by mouth at bedtime.           Past Medical History  Diagnosis Date  . Diabetes mellitus   . HTN (hypertension)   . Hyperlipemia   . Spinal stenosis   . Thalassemia   . History of Lyme disease   . PTSD (post-traumatic stress disorder)   . Cancer     left cheek    Past Surgical History  Procedure Date  . Cataract extraction, bilateral   . Kidney stone surgery   . Hemorroidectomy      History   Social History  . Marital Status: Married    Spouse Name: N/A    Number of Children: N/A  . Years of Education: N/A   Occupational History  . Not on file.   Social History Main Topics  . Smoking status: Current Every Day Smoker -- 78 years    Types: Cigars  . Smokeless tobacco: Never Used     Comment: pt states that he does not inhale  . Alcohol Use: No  . Drug Use: No  . Sexually Active: Not on file   Other Topics Concern  . Not on file   Social History Narrative  . No narrative on file    ROS: no fevers or chills, productive cough, hemoptysis, dysphasia, odynophagia, melena, hematochezia, dysuria, hematuria, rash, seizure activity, orthopnea, PND, pedal edema, claudication. Remaining systems are negative.  Physical Exam: Well-developed well-nourished in no acute distress.  Skin is warm and dry.  HEENT is normal.  Neck is supple.  Chest is clear to auscultation with normal expansion.  Cardiovascular exam is regular rate and rhythm. 2/6 systolic murmur left sternal border. S2 is not diminished. Abdominal exam nontender or distended. No masses palpated. Extremities show no edema. neuro grossly intact  ECG sinus rhythm with ventricular bigeminy. First degree AV block. Nonspecific T-wave changes.

## 2012-03-01 NOTE — Assessment & Plan Note (Signed)
Patient counseled on discontinuing. 

## 2012-03-01 NOTE — Assessment & Plan Note (Signed)
Continue statin. Check lipids and liver. 

## 2012-03-02 ENCOUNTER — Encounter: Payer: Self-pay | Admitting: *Deleted

## 2012-03-23 ENCOUNTER — Encounter: Payer: Self-pay | Admitting: *Deleted

## 2012-03-23 ENCOUNTER — Telehealth: Payer: Self-pay | Admitting: Internal Medicine

## 2012-03-23 NOTE — Telephone Encounter (Signed)
Pt states he is constipated. Pt has tried metamucil, Magnesium pills, Colace, Linzess, and miralax. He has had 2 small bm's but he feels like when he strains to go the fecal matter is going back up in him. Pt states he thinks it may be an emotional problem-PTSD. Pt is a Insurance account manager. Requesting to be seen. Pt scheduled to see Mike Gip PA Monday 03/26/12@2 :30pm. Pt aware of appt date and time.

## 2012-03-26 ENCOUNTER — Ambulatory Visit (INDEPENDENT_AMBULATORY_CARE_PROVIDER_SITE_OTHER): Payer: Medicare Other | Admitting: Physician Assistant

## 2012-03-26 ENCOUNTER — Other Ambulatory Visit (INDEPENDENT_AMBULATORY_CARE_PROVIDER_SITE_OTHER): Payer: Medicare Other

## 2012-03-26 ENCOUNTER — Encounter: Payer: Self-pay | Admitting: Physician Assistant

## 2012-03-26 VITALS — BP 138/72 | HR 88 | Ht 67.0 in | Wt 172.0 lb

## 2012-03-26 DIAGNOSIS — K5900 Constipation, unspecified: Secondary | ICD-10-CM

## 2012-03-26 DIAGNOSIS — Z125 Encounter for screening for malignant neoplasm of prostate: Secondary | ICD-10-CM

## 2012-03-26 DIAGNOSIS — N429 Disorder of prostate, unspecified: Secondary | ICD-10-CM

## 2012-03-26 DIAGNOSIS — K5909 Other constipation: Secondary | ICD-10-CM

## 2012-03-26 NOTE — Progress Notes (Signed)
Agree with assessment and plans as outlined by extender

## 2012-03-26 NOTE — Patient Instructions (Addendum)
Please go to the basement level to have your labs drawn.  Take Miralax 17 grams in 8 oz of water or juice. Do this every other day and may increase to daily if needed.  Follow up with [place] as needed.

## 2012-03-26 NOTE — Progress Notes (Signed)
Subjective:    Patient ID: Antonio Buchanan, male    DOB: 1921/10/07, 79 y.o.   MRN: [id number]  HPI Antonio Buchanan is a very nice 79 year old gentleman known to Dr. Marina Goodell in last seen in February of 2012. He has history of chronic constipation, diabetes, hypertension, hyperlipidemia, spinal stenosis, chronic anxiety and depression. He states that he has just received full disability for PTSD as a result of service during WWII. He talked extensively about his PTSD during the interview today and has been seeing a therapist at the [place]. He has severe symptoms and states that he has kept much of his stress in his gut over the years and he feels that this is contributed to his chronic constipation He last underwent colonoscopy in 2006, this was done in [place] and showed one small hyperplastic polyp only. He had EGD in 2005 showing mild esophagitis also done in [place]. He came in today to discuss management of his constipation , he has tried numerous medications and over-the-counter laxatives but has not taken anything on a consistent basis. He says he admits that he has paranoid about medications and putting chemicals into his body. He has milk of magnesia, magnesium tablets stool softeners MiraLax and Linzess.He says he had taken the Linzess on a couple of occasions and this seemed to work but then tried it on one other occasion and it did not work and has decided that this medicine is not effective . He took multiple different laxatives over this past week and his wife states that he finally had a "major cleanout" yesterday.He asks for advice regarding what laxative to use on a regular basis, and whether taking laxatives regularly is harmful. His appetite is good his weight is stable, he denies any problems with ongoing abdominal pain has not noted any melena or hematochezia. He says all of his abdominal symptoms seem to be relieved if he is not constipated.    Review of Systems  Constitutional:  Negative.   HENT: Negative.   Eyes: Negative.   Respiratory: Negative.   Cardiovascular: Negative.   Gastrointestinal: Positive for constipation.  Genitourinary: Negative.   Musculoskeletal: Positive for back pain.  Skin: Negative.   Psychiatric/Behavioral: Positive for dysphoric mood. The patient is nervous/anxious.    Outpatient Prescriptions Prior to Visit  Medication Sig Dispense Refill  . amLODipine-benazepril (LOTREL) 5-10 MG per capsule Take 1 capsule by mouth daily.        Marland Kitchen atenolol (TENORMIN) 25 MG tablet Take 25 mg by mouth as needed.        Marland Kitchen buPROPion (WELLBUTRIN) 100 MG tablet 1/2 tab po qd      . clonazePAM (KLONOPIN) 1 MG tablet Take 1 mg by mouth as needed.        Tery Sanfilippo Calcium (STOOL SOFTENER PO) Take by mouth daily.        . fenofibrate (TRICOR) 48 MG tablet 1/2 tab po qd      . fish oil-omega-3 fatty acids 1000 MG capsule Take 1 g by mouth daily.        Marland Kitchen MAGNESIUM PO Take by mouth daily.        . metFORMIN (GLUCOPHAGE) 1000 MG tablet Take 2,000 mg by mouth daily.        . Multiple Vitamin (MULTIVITAMIN) tablet Take 1 tablet by mouth daily.        . Multiple Vitamins-Minerals (ZINC PO) Take by mouth daily.        . Pyridoxine HCl (VITAMIN B6 PO)  Take by mouth daily.        . RABEprazole (ACIPHEX) 20 MG tablet Take 20 mg by mouth daily.        . simvastatin (ZOCOR) 40 MG tablet Take 20 mg by mouth at bedtime.         Last reviewed on 03/26/2012  4:14 PM by Sammuel Cooper, PA  No Known Allergies     Patient Active Problem List  Diagnosis  . GERD  . NAUSEA  . FLATULENCE-GAS-BLOATING  . ABDOMINAL PAIN-EPIGASTRIC  . Diabetes mellitus  . HTN (hypertension)  . Hyperlipemia  . Spinal stenosis  . Thalassemia  . History of Lyme disease  . PTSD (post-traumatic stress disorder)  . Murmur  . Tobacco abuse  . Chronic venous hypertension without complications  . Aortic stenosis  . Chronic constipation   History  Substance Use Topics  . Smoking status:  Current Every Day Smoker -- 78 years    Types: Cigars  . Smokeless tobacco: Never Used     Comment: pt states that he does not inhale  . Alcohol Use: No    Objective:   Physical Exam well-developed elderly [place] male  in no acute distress, quite pleasant accompanied by his wife. Blood pressure 138/72 pulse 88 height 5 foot 7 weight 172. HEENT; nontraumatic normocephalic EOMI PERRLA sclera anicteric, Supple no JVD, Cardiovascular; regular rate and rhythm with S1-S2 no murmur or gallop, Pulmonary; clear bilaterally, Abdomen ;soft nontender nondistended bowel sounds are active there is no palpable mass or hepatosplenomegaly, Rectal; exam not done, Extremities; no clubbing cyanosis or edema skin warm and dry, Psych; mood and affect appropriate.        Assessment & Plan:  #27 79 year old male with chronic constipation/IBS. Symptoms poorly controlled as he is noncompliant with any regular regimen. #2 history of hyperplastic colon polyps-last colonoscopy 2006 #3 severe PTSD #4 thalassemia #5 diabetes mellitus #6 hypertension  Plan;Long discussion with the patient today and his wife. He is reassured that it would be perfectly safe for him to take either MiraLax or Linzess on a daily basis or every other day basis and  That  both of these agents will be more effective if used regularly . We discussed simplifying his regimen to 1 laxative and using this regularly which he says he is willing to try and we'll start with MiraLax 17 g daily in 8 ounces of water. We also discussed increasing fluid intake during the day. He is being treated finally for his PTSD through the [place], and hopefully in the long run will obtain some peace of mind.

## 2012-04-02 DIAGNOSIS — I10 Essential (primary) hypertension: Secondary | ICD-10-CM | POA: Diagnosis not present

## 2012-04-02 DIAGNOSIS — IMO0001 Reserved for inherently not codable concepts without codable children: Secondary | ICD-10-CM | POA: Diagnosis not present

## 2012-04-02 DIAGNOSIS — I739 Peripheral vascular disease, unspecified: Secondary | ICD-10-CM | POA: Diagnosis not present

## 2012-04-02 DIAGNOSIS — E1159 Type 2 diabetes mellitus with other circulatory complications: Secondary | ICD-10-CM | POA: Diagnosis not present

## 2012-04-12 DIAGNOSIS — I10 Essential (primary) hypertension: Secondary | ICD-10-CM | POA: Diagnosis not present

## 2012-04-12 DIAGNOSIS — IMO0001 Reserved for inherently not codable concepts without codable children: Secondary | ICD-10-CM | POA: Diagnosis not present

## 2012-04-12 DIAGNOSIS — R269 Unspecified abnormalities of gait and mobility: Secondary | ICD-10-CM | POA: Diagnosis not present

## 2012-05-11 DIAGNOSIS — I10 Essential (primary) hypertension: Secondary | ICD-10-CM | POA: Diagnosis not present

## 2012-05-11 DIAGNOSIS — E119 Type 2 diabetes mellitus without complications: Secondary | ICD-10-CM | POA: Diagnosis not present

## 2012-05-11 DIAGNOSIS — M48061 Spinal stenosis, lumbar region without neurogenic claudication: Secondary | ICD-10-CM | POA: Diagnosis not present

## 2012-05-16 DIAGNOSIS — M48061 Spinal stenosis, lumbar region without neurogenic claudication: Secondary | ICD-10-CM | POA: Diagnosis not present

## 2012-05-16 DIAGNOSIS — M4802 Spinal stenosis, cervical region: Secondary | ICD-10-CM | POA: Diagnosis not present

## 2012-05-17 DIAGNOSIS — M4802 Spinal stenosis, cervical region: Secondary | ICD-10-CM | POA: Diagnosis not present

## 2012-05-17 DIAGNOSIS — M48061 Spinal stenosis, lumbar region without neurogenic claudication: Secondary | ICD-10-CM | POA: Diagnosis not present

## 2012-05-22 DIAGNOSIS — M4802 Spinal stenosis, cervical region: Secondary | ICD-10-CM | POA: Diagnosis not present

## 2012-05-22 DIAGNOSIS — M48061 Spinal stenosis, lumbar region without neurogenic claudication: Secondary | ICD-10-CM | POA: Diagnosis not present

## 2012-05-24 DIAGNOSIS — M4802 Spinal stenosis, cervical region: Secondary | ICD-10-CM | POA: Diagnosis not present

## 2012-05-24 DIAGNOSIS — M48061 Spinal stenosis, lumbar region without neurogenic claudication: Secondary | ICD-10-CM | POA: Diagnosis not present

## 2012-05-29 DIAGNOSIS — M48061 Spinal stenosis, lumbar region without neurogenic claudication: Secondary | ICD-10-CM | POA: Diagnosis not present

## 2012-05-29 DIAGNOSIS — M4802 Spinal stenosis, cervical region: Secondary | ICD-10-CM | POA: Diagnosis not present

## 2012-05-31 DIAGNOSIS — M48061 Spinal stenosis, lumbar region without neurogenic claudication: Secondary | ICD-10-CM | POA: Diagnosis not present

## 2012-05-31 DIAGNOSIS — M4802 Spinal stenosis, cervical region: Secondary | ICD-10-CM | POA: Diagnosis not present

## 2012-06-01 DIAGNOSIS — E785 Hyperlipidemia, unspecified: Secondary | ICD-10-CM | POA: Diagnosis not present

## 2012-06-01 DIAGNOSIS — E1159 Type 2 diabetes mellitus with other circulatory complications: Secondary | ICD-10-CM | POA: Diagnosis not present

## 2012-06-01 DIAGNOSIS — M48061 Spinal stenosis, lumbar region without neurogenic claudication: Secondary | ICD-10-CM | POA: Diagnosis not present

## 2012-06-01 DIAGNOSIS — IMO0001 Reserved for inherently not codable concepts without codable children: Secondary | ICD-10-CM | POA: Diagnosis not present

## 2012-06-01 DIAGNOSIS — I10 Essential (primary) hypertension: Secondary | ICD-10-CM | POA: Diagnosis not present

## 2012-06-06 DIAGNOSIS — L608 Other nail disorders: Secondary | ICD-10-CM | POA: Diagnosis not present

## 2012-06-06 DIAGNOSIS — E1149 Type 2 diabetes mellitus with other diabetic neurological complication: Secondary | ICD-10-CM | POA: Diagnosis not present

## 2012-06-07 DIAGNOSIS — M48061 Spinal stenosis, lumbar region without neurogenic claudication: Secondary | ICD-10-CM | POA: Diagnosis not present

## 2012-06-07 DIAGNOSIS — M4802 Spinal stenosis, cervical region: Secondary | ICD-10-CM | POA: Diagnosis not present

## 2012-06-12 DIAGNOSIS — M47812 Spondylosis without myelopathy or radiculopathy, cervical region: Secondary | ICD-10-CM | POA: Diagnosis not present

## 2012-06-14 ENCOUNTER — Encounter (HOSPITAL_COMMUNITY): Payer: Self-pay | Admitting: Emergency Medicine

## 2012-06-14 ENCOUNTER — Emergency Department (HOSPITAL_COMMUNITY)
Admission: EM | Admit: 2012-06-14 | Discharge: 2012-06-14 | Disposition: A | Discharge status: Home / Self Care | Payer: Medicare Other | Attending: Emergency Medicine | Admitting: Emergency Medicine

## 2012-06-14 ENCOUNTER — Emergency Department (HOSPITAL_COMMUNITY): Payer: Medicare Other

## 2012-06-14 DIAGNOSIS — M19049 Primary osteoarthritis, unspecified hand: Secondary | ICD-10-CM | POA: Insufficient documentation

## 2012-06-14 DIAGNOSIS — Z8659 Personal history of other mental and behavioral disorders: Secondary | ICD-10-CM | POA: Diagnosis not present

## 2012-06-14 DIAGNOSIS — F17200 Nicotine dependence, unspecified, uncomplicated: Secondary | ICD-10-CM | POA: Insufficient documentation

## 2012-06-14 DIAGNOSIS — Z79899 Other long term (current) drug therapy: Secondary | ICD-10-CM | POA: Insufficient documentation

## 2012-06-14 DIAGNOSIS — E785 Hyperlipidemia, unspecified: Secondary | ICD-10-CM | POA: Diagnosis not present

## 2012-06-14 DIAGNOSIS — Z8619 Personal history of other infectious and parasitic diseases: Secondary | ICD-10-CM | POA: Diagnosis not present

## 2012-06-14 DIAGNOSIS — M1990 Unspecified osteoarthritis, unspecified site: Secondary | ICD-10-CM

## 2012-06-14 DIAGNOSIS — Z8739 Personal history of other diseases of the musculoskeletal system and connective tissue: Secondary | ICD-10-CM | POA: Diagnosis not present

## 2012-06-14 DIAGNOSIS — E119 Type 2 diabetes mellitus without complications: Secondary | ICD-10-CM | POA: Insufficient documentation

## 2012-06-14 DIAGNOSIS — I10 Essential (primary) hypertension: Secondary | ICD-10-CM | POA: Insufficient documentation

## 2012-06-14 DIAGNOSIS — M129 Arthropathy, unspecified: Secondary | ICD-10-CM | POA: Diagnosis not present

## 2012-06-14 DIAGNOSIS — Z862 Personal history of diseases of the blood and blood-forming organs and certain disorders involving the immune mechanism: Secondary | ICD-10-CM | POA: Insufficient documentation

## 2012-06-14 DIAGNOSIS — M1610 Unilateral primary osteoarthritis, unspecified hip: Secondary | ICD-10-CM | POA: Insufficient documentation

## 2012-06-14 DIAGNOSIS — Z8589 Personal history of malignant neoplasm of other organs and systems: Secondary | ICD-10-CM | POA: Insufficient documentation

## 2012-06-14 DIAGNOSIS — Z860100 Personal history of colon polyps, unspecified: Secondary | ICD-10-CM | POA: Insufficient documentation

## 2012-06-14 DIAGNOSIS — Z85038 Personal history of other malignant neoplasm of large intestine: Secondary | ICD-10-CM | POA: Insufficient documentation

## 2012-06-14 DIAGNOSIS — M25859 Other specified joint disorders, unspecified hip: Secondary | ICD-10-CM | POA: Diagnosis not present

## 2012-06-14 DIAGNOSIS — Z8719 Personal history of other diseases of the digestive system: Secondary | ICD-10-CM | POA: Insufficient documentation

## 2012-06-14 MED ORDER — OXYCODONE-ACETAMINOPHEN 5-325 MG PO TABS
2.0000 | ORAL_TABLET | Freq: Once | ORAL | Status: AC
  Administered 2012-06-14: 2 via ORAL
  Filled 2012-06-14: qty 2

## 2012-06-14 MED ORDER — IBUPROFEN 800 MG PO TABS
800.0000 mg | ORAL_TABLET | Freq: Once | ORAL | Status: AC
  Administered 2012-06-14: 800 mg via ORAL
  Filled 2012-06-14: qty 1

## 2012-06-14 MED ORDER — OXYCODONE-ACETAMINOPHEN 5-325 MG PO TABS: 2.0000 | ORAL_TABLET | ORAL | Status: DC | PRN

## 2012-06-14 NOTE — Progress Notes (Signed)
ED CM consulted by EDP, Silverio Lay to assist with home health RN, PT and aide  Cm reviewed in details medicare guidelines, home health [facility]) (length of stay in home, types of [place] staff available, coverage, primary caregiver, up to 24 hrs before services may be started) with pt and male family member at bedside Pt and family chose [place] for services Confirms pt has 2 wheelchairs, a cane, a hospital bed, a rolator, a standard walker and commode seat  Pt reports also being seen for PTSD dx at [employer] [facility] Pt associated with Vilinda Boehringer but sees Dr Jarome Matin as pcp locally Pt moved from [place] to  five years ago  Choice offered to / List presented to pt/wife:  HOME HEALTH AGENCIES SERVING [place] [employer] that are Medicare-Certified and are affiliated with The [facility] Home Health Agency  Telephone Number Address  [facility]   The [facility] has ownership interest in this company; however, you are under no obligation to use this agency. [phone] or  [phone] [address] [place], [place] [address] [email or web address]   Agencies that are Medicare-Certified and are not affiliated with The [facility] Agency Telephone Number Address  [facility] [phone] Fax [phone] [address], [address] [place], [place]  [address] [email or web address]  [facility] [phone] or [phone] Fax [phone] [address] [place], [place] [address] [email or web address]  [facility] [phone] Fax [phone] [address] [place], [place] [address] [email or web address]  [facility] [phone] Fax [phone] [address] [place], [place]  [address] [email or web address]  [facility] [phone] Fax [phone] [address], Suite [place], [place] [address] [email or web address]  [facility] [phone] [address] [place], [place] [address] [email or web address]  [facility] [phone]  [address] [place], [place] [address] [email or web address]  [facility] [phone] or [phone] Fax number [phone] [address] [place], [place]  [address] [email or web address]  [facility] [phone] Fax [phone] [address] [place], [place]  [address]  [facility]  [phone] Fax [phone] [address] [place], [place] [address] [email or web address]

## 2012-06-14 NOTE — Progress Notes (Signed)
Referral clinical information including face sheet, HH order, face to face & EDP progress notes faxed to [facility] [phone]  confirmation fax received at 1249

## 2012-06-14 NOTE — Progress Notes (Signed)
Cm spoke with Burna Mortimer at [facility] and conference call when Burna Mortimer stated [facility] unable to get to pt until June 16 2012. Pt and wife agreed to 06/16/12 initial HHRN visit

## 2012-06-14 NOTE — Progress Notes (Signed)
CSW received referral, csw discussed with edp who stated patient needed home health. Patient was set up with home health with RN CM. Marland KitchenNo further Clinical Social Work needs, signing off.   Catha Gosselin, LCSWA  [phone] .06/14/2012 1330pm

## 2012-06-14 NOTE — ED Provider Notes (Addendum)
History     CSN: [id number]  Arrival date & time 06/14/12  1124   First MD Initiated Contact with Patient 06/14/12 1135      Chief Complaint  Patient presents with  . Generalized Body Aches    (Consider location/radiation/quality/duration/timing/severity/associated sxs/prior treatment) The history is provided by the patient.  Raju Coppolino is a 79 y.o. male hx of DM, HTN, PTSD here with worsening diffuse pain. For the last several weeks he's been having generalized pain when he wakes up. The pain is worse in his hips and his hands to the point that it hard for him to ambulate. However by late afternoon he is able to ambulate. He lives at home with wife and has no help at home. As per wife he has been having a lot of pain in the knee more help at home. Denies any fevers or chills or weakness or numbness or incontinence. He saw his orthopedic doctor yesterday and had a neck x-ray that showed arthritis.    Past Medical History  Diagnosis Date  . Diabetes mellitus   . HTN (hypertension)   . Hyperlipemia   . Spinal stenosis   . Thalassemia   . History of Lyme disease   . PTSD (post-traumatic stress disorder)   . Cancer     left cheek  . Hemorrhoids 02/03/2005    Internal and external  . Diverticulosis of colon (without mention of hemorrhage) 02/03/2005  . Personal history of colonic polyps   . Benign neoplasm of colon     Past Surgical History  Procedure Laterality Date  . Cataract extraction, bilateral    . Kidney stone surgery    . Hemorroidectomy      Family History  Problem Relation Age of Onset  . Diabetes Father   . Leukemia Mother   . Hypertension Mother   . Arthritis Mother   . Diabetes Sister     History  Substance Use Topics  . Smoking status: Current Every Day Smoker -- 78 years    Types: Cigars  . Smokeless tobacco: Never Used     Comment: pt states that he does not inhale  . Alcohol Use: No      Review of Systems  Musculoskeletal:  Positive for arthralgias.  All other systems reviewed and are negative.    Allergies  Review of patient's allergies indicates no known allergies.  Home Medications   Current Outpatient Rx  Name  Route  Sig  Dispense  Refill  . amLODipine-benazepril (LOTREL) 5-10 MG per capsule   Oral   Take 1 capsule by mouth daily before breakfast.          . atenolol (TENORMIN) 25 MG tablet   Oral   Take 25 mg by mouth daily before breakfast.          . benazepril (LOTENSIN) 20 MG tablet   Oral   Take 20 mg by mouth daily before breakfast.          . buPROPion (WELLBUTRIN) 100 MG tablet   Oral   Take 50 mg by mouth every evening.          . docusate sodium (COLACE) 100 MG capsule   Oral   Take 200 mg by mouth daily.         . fish oil-omega-3 fatty acids 1000 MG capsule   Oral   Take 1 g by mouth daily.           Marland Kitchen gabapentin (NEURONTIN) 300 MG capsule  Oral   Take 300 mg by mouth 3 (three) times daily.         . metFORMIN (GLUCOPHAGE) 1000 MG tablet   Oral   Take 500 mg by mouth 4 (four) times daily - after meals and at bedtime.          . Multiple Vitamin (MULTIVITAMIN) tablet   Oral   Take 1 tablet by mouth daily.           . nabumetone (RELAFEN) 500 MG tablet   Oral   Take 500 mg by mouth 2 (two) times daily.         . polyethylene glycol (MIRALAX / GLYCOLAX) packet   Oral   Take 17 g by mouth daily.         . Psyllium (METAMUCIL PO)   Oral   Take 5 mLs by mouth every other day. One teaspoon in juice once daily         . Pyridoxine HCl (VITAMIN B6 PO)   Oral   Take by mouth daily.           . RABEprazole (ACIPHEX) 20 MG tablet   Oral   Take 20 mg by mouth daily.             BP 146/83  Pulse 81  Temp(Src) 98.7 F (37.1 C) (Oral)  SpO2 97%  Physical Exam  Nursing note and vitals reviewed. Constitutional: He is oriented to person, place, and time. He appears well-developed and well-nourished.  Uncomfortable   HENT:  Head:  Normocephalic.  Mouth/Throat: Oropharynx is clear and moist.  Eyes: Conjunctivae are normal. Pupils are equal, round, and reactive to light.  Neck: Normal range of motion. Neck supple.  Cardiovascular: Normal rate, regular rhythm and normal heart sounds.   Pulmonary/Chest: Effort normal and breath sounds normal. No respiratory distress. He has no wheezes. He has no rales.  Abdominal: Soft. Bowel sounds are normal. He exhibits no distension. There is no tenderness. There is no rebound and no guarding.  Musculoskeletal:  Hands swollen and diffusely tender, not warm. Dec ROM bilateral hips but nl gait. Bilateral knee nl ROM. Neurovascular intact, no saddle anesthesia. No midline spinal tenderness.   Neurological: He is alert and oriented to person, place, and time.  Skin: Skin is warm and dry.  Psychiatric: He has a normal mood and affect. His behavior is normal. Judgment and thought content normal.    ED Course  Procedures (including critical care time)  Labs Reviewed - No data to display Dg Pelvis 1-2 Views  06/14/2012  *RADIOLOGY REPORT*  Clinical Data: Bilateral hip pain.  History rheumatoid arthritis.  PELVIS - 1-2 VIEW  Comparison: None.  Findings: There is some joint space narrowing of both hips, greater on the right.  No erosion is identified.  Sacroiliac joints and symphysis pubis are unremarkable.  There is no fracture.  Large stool ball in the rectosigmoid is noted.  IMPRESSION:  1.  Bilateral hip joint space narrowing could be due to rheumatoid arthritis or osteoarthritis. 2.  Large stool ball rectosigmoid colon.   Original Report Authenticated By: Holley Dexter, M.D.      No diagnosis found.    MDM  Jaquin Coy is a 79 y.o. male here with bilateral hip pain. Likely arthritis. He is on NSAIDs and gabapentin but pain not improved. May need short course of narcotics. Will also recommend outpatient workup for rheumatoid vs osteoarthritis. I called case management to  arrange home care and PT.  1:17 PM Xray showed arthritis. Patient ambulating in the ED after percocet and motrin. He is chronically constipated and has been following up with GI doctor. Will d/c home on NSAIDs and short course of percocet. I recommend that he f/u with PMD to work up for possible rheumatoid arthritis. Case management arranged for home care, PT, aid, and visiting nurse.        Richardean Canal, MD 06/14/12 1319  Richardean Canal, MD 06/14/12 1322

## 2012-06-14 NOTE — ED Notes (Signed)
Pt c/o of generalized pain throughout body. Has arthritis.

## 2012-06-26 DIAGNOSIS — R5381 Other malaise: Secondary | ICD-10-CM | POA: Diagnosis not present

## 2012-06-26 DIAGNOSIS — M25549 Pain in joints of unspecified hand: Secondary | ICD-10-CM | POA: Diagnosis not present

## 2012-06-26 DIAGNOSIS — M5137 Other intervertebral disc degeneration, lumbosacral region: Secondary | ICD-10-CM | POA: Diagnosis not present

## 2012-06-26 DIAGNOSIS — M5030 Other cervical disc degeneration, unspecified cervical region: Secondary | ICD-10-CM | POA: Diagnosis not present

## 2012-06-28 DIAGNOSIS — R5381 Other malaise: Secondary | ICD-10-CM | POA: Diagnosis not present

## 2012-06-28 DIAGNOSIS — Z79899 Other long term (current) drug therapy: Secondary | ICD-10-CM | POA: Diagnosis not present

## 2012-06-28 DIAGNOSIS — M2550 Pain in unspecified joint: Secondary | ICD-10-CM | POA: Diagnosis not present

## 2012-06-28 DIAGNOSIS — R5383 Other fatigue: Secondary | ICD-10-CM | POA: Diagnosis not present

## 2012-07-02 DIAGNOSIS — M2550 Pain in unspecified joint: Secondary | ICD-10-CM | POA: Diagnosis not present

## 2012-07-09 DIAGNOSIS — E559 Vitamin D deficiency, unspecified: Secondary | ICD-10-CM | POA: Diagnosis not present

## 2012-07-09 DIAGNOSIS — M2550 Pain in unspecified joint: Secondary | ICD-10-CM | POA: Diagnosis not present

## 2012-07-10 DIAGNOSIS — M545 Low back pain: Secondary | ICD-10-CM | POA: Diagnosis not present

## 2012-07-10 DIAGNOSIS — M48061 Spinal stenosis, lumbar region without neurogenic claudication: Secondary | ICD-10-CM | POA: Diagnosis not present

## 2012-07-16 DIAGNOSIS — IMO0001 Reserved for inherently not codable concepts without codable children: Secondary | ICD-10-CM | POA: Diagnosis not present

## 2012-07-16 DIAGNOSIS — I739 Peripheral vascular disease, unspecified: Secondary | ICD-10-CM | POA: Diagnosis not present

## 2012-07-16 DIAGNOSIS — E1159 Type 2 diabetes mellitus with other circulatory complications: Secondary | ICD-10-CM | POA: Diagnosis not present

## 2012-07-16 DIAGNOSIS — I10 Essential (primary) hypertension: Secondary | ICD-10-CM | POA: Diagnosis not present

## 2012-07-23 DIAGNOSIS — R609 Edema, unspecified: Secondary | ICD-10-CM | POA: Diagnosis not present

## 2012-07-23 DIAGNOSIS — M5137 Other intervertebral disc degeneration, lumbosacral region: Secondary | ICD-10-CM | POA: Diagnosis not present

## 2012-07-23 DIAGNOSIS — M353 Polymyalgia rheumatica: Secondary | ICD-10-CM | POA: Diagnosis not present

## 2012-07-23 DIAGNOSIS — M5030 Other cervical disc degeneration, unspecified cervical region: Secondary | ICD-10-CM | POA: Diagnosis not present

## 2012-08-08 ENCOUNTER — Ambulatory Visit: Payer: Medicare Other | Admitting: Neurosurgery

## 2012-08-16 DIAGNOSIS — M5137 Other intervertebral disc degeneration, lumbosacral region: Secondary | ICD-10-CM | POA: Diagnosis not present

## 2012-08-16 DIAGNOSIS — M353 Polymyalgia rheumatica: Secondary | ICD-10-CM | POA: Diagnosis not present

## 2012-08-16 DIAGNOSIS — E119 Type 2 diabetes mellitus without complications: Secondary | ICD-10-CM | POA: Diagnosis not present

## 2012-08-16 DIAGNOSIS — M5030 Other cervical disc degeneration, unspecified cervical region: Secondary | ICD-10-CM | POA: Diagnosis not present

## 2012-08-21 DIAGNOSIS — L738 Other specified follicular disorders: Secondary | ICD-10-CM | POA: Diagnosis not present

## 2012-08-21 DIAGNOSIS — D692 Other nonthrombocytopenic purpura: Secondary | ICD-10-CM | POA: Diagnosis not present

## 2012-08-21 DIAGNOSIS — L821 Other seborrheic keratosis: Secondary | ICD-10-CM | POA: Diagnosis not present

## 2012-08-21 DIAGNOSIS — Z85828 Personal history of other malignant neoplasm of skin: Secondary | ICD-10-CM | POA: Diagnosis not present

## 2012-08-27 DIAGNOSIS — I10 Essential (primary) hypertension: Secondary | ICD-10-CM | POA: Diagnosis not present

## 2012-08-27 DIAGNOSIS — E1159 Type 2 diabetes mellitus with other circulatory complications: Secondary | ICD-10-CM | POA: Diagnosis not present

## 2012-08-27 DIAGNOSIS — Z6830 Body mass index (BMI) 30.0-30.9, adult: Secondary | ICD-10-CM | POA: Diagnosis not present

## 2012-08-27 DIAGNOSIS — R51 Headache: Secondary | ICD-10-CM | POA: Diagnosis not present

## 2012-08-27 DIAGNOSIS — E785 Hyperlipidemia, unspecified: Secondary | ICD-10-CM | POA: Diagnosis not present

## 2012-09-05 DIAGNOSIS — L608 Other nail disorders: Secondary | ICD-10-CM | POA: Diagnosis not present

## 2012-09-05 DIAGNOSIS — E1149 Type 2 diabetes mellitus with other diabetic neurological complication: Secondary | ICD-10-CM | POA: Diagnosis not present

## 2012-09-06 DIAGNOSIS — R51 Headache: Secondary | ICD-10-CM | POA: Diagnosis not present

## 2012-09-06 DIAGNOSIS — I6789 Other cerebrovascular disease: Secondary | ICD-10-CM | POA: Diagnosis not present

## 2012-09-10 ENCOUNTER — Encounter: Payer: Self-pay | Admitting: Vascular Surgery

## 2012-09-11 ENCOUNTER — Ambulatory Visit (INDEPENDENT_AMBULATORY_CARE_PROVIDER_SITE_OTHER): Payer: Medicare Other | Admitting: Vascular Surgery

## 2012-09-11 ENCOUNTER — Encounter: Payer: Self-pay | Admitting: Vascular Surgery

## 2012-09-11 VITALS — BP 171/75 | HR 66 | Resp 18 | Ht 67.0 in | Wt 171.2 lb

## 2012-09-11 DIAGNOSIS — I878 Other specified disorders of veins: Secondary | ICD-10-CM

## 2012-09-11 DIAGNOSIS — I872 Venous insufficiency (chronic) (peripheral): Secondary | ICD-10-CM | POA: Diagnosis not present

## 2012-09-11 NOTE — Progress Notes (Signed)
The patient presents today for continued followup of chronic venous hypertension of his left leg. This is been many year issue. He has good control with very compliant use of his graduated compression garment. He fortunately has had no venous ulcerations. He is seen yearly do to a requirement of a yearly physician evaluation for workmen compensation. He has been diagnosed with polymyalgia rheumatica and this is causing him to have generalized weakness and he is not fond of the prednisone treatment.  Past Medical History  Diagnosis Date  . Diabetes mellitus   . HTN (hypertension)   . Hyperlipemia   . Spinal stenosis   . Thalassemia   . History of Lyme disease   . PTSD (post-traumatic stress disorder)   . Cancer     left cheek  . Hemorrhoids 02/03/2005    Internal and external  . Diverticulosis of colon (without mention of hemorrhage) 02/03/2005  . Personal history of colonic polyps   . Benign neoplasm of colon   . Polymyalgia rheumatica   . Varicose veins     History  Substance Use Topics  . Smoking status: Current Every Day Smoker -- 78 years    Types: Cigars  . Smokeless tobacco: Never Used     Comment: pt states that he does not inhale  . Alcohol Use: No    Family History  Problem Relation Age of Onset  . Diabetes Father   . Leukemia Mother   . Hypertension Mother   . Arthritis Mother   . Diabetes Sister     Allergies  Allergen Reactions  . Lamisil Af Defense (Tolnaftate)     Severe hives    Current outpatient prescriptions:amLODipine-benazepril (LOTREL) 5-10 MG per capsule, Take 1 capsule by mouth daily before breakfast. , Disp: , Rfl: ;  atenolol (TENORMIN) 25 MG tablet, Take 25 mg by mouth daily before breakfast. , Disp: , Rfl: ;  benazepril (LOTENSIN) 20 MG tablet, Take 20 mg by mouth 3 (three) times a week. Takes one tablet on Monday/Wednesday/Friday., Disp: , Rfl:  buPROPion (WELLBUTRIN) 100 MG tablet, Take 50 mg by mouth every evening. , Disp: , Rfl: ;   docusate sodium (COLACE) 100 MG capsule, Take 200 mg by mouth daily., Disp: , Rfl: ;  fish oil-omega-3 fatty acids 1000 MG capsule, Take 1 g by mouth daily.  , Disp: , Rfl: ;  gabapentin (NEURONTIN) 300 MG capsule, Take 300 mg by mouth 3 (three) times daily., Disp: , Rfl:  metFORMIN (GLUCOPHAGE) 1000 MG tablet, Take 500 mg by mouth 4 (four) times daily - after meals and at bedtime. , Disp: , Rfl: ;  polyethylene glycol (MIRALAX / GLYCOLAX) packet, Take 17 g by mouth daily., Disp: , Rfl: ;  PREDNISONE PO, Take by mouth daily. As directed., Disp: , Rfl: ;  Psyllium (METAMUCIL PO), Take 5 mLs by mouth every other day. One teaspoon in juice once daily, Disp: , Rfl:  Pyridoxine HCl (VITAMIN B6 PO), Take by mouth daily.  , Disp: , Rfl: ;  RABEprazole (ACIPHEX) 20 MG tablet, Take 20 mg by mouth daily.  , Disp: , Rfl: ;  Multiple Vitamin (MULTIVITAMIN) tablet, Take 1 tablet by mouth daily.  , Disp: , Rfl: ;  nabumetone (RELAFEN) 500 MG tablet, Take 500 mg by mouth 2 (two) times daily., Disp: , Rfl:  oxyCODONE-acetaminophen (PERCOCET) 5-325 MG per tablet, Take 2 tablets by mouth every 4 (four) hours as needed for pain., Disp: 10 tablet, Rfl: 0  BP 171/75  Pulse 66  Resp 18  Ht 5\' 7"  (1.702 m)  Wt 171 lb 3.2 oz (77.656 kg)  BMI 26.81 kg/m2  Body mass index is 26.81 kg/(m^2).       Exam well-developed alert gentleman in no acute distress 2+ dorsalis pedis pulses bilaterally Changes of marked venous hypertension in his left leg hemosiderin deposit and varicosities. He does not have any open ulcerations. Neurologically he is grossly intact  Impression and plan chronic venous hypertension left leg. Again was stressed the importance of elevation and graduated compression garments which he is quite compliant with. He'll notify us if he should develop any open ulceration. I did explain that this is very unlikely to be limb threatening a to is normal arterial flow. We will see him again in one year for  continued documentation

## 2012-10-23 DIAGNOSIS — L821 Other seborrheic keratosis: Secondary | ICD-10-CM | POA: Diagnosis not present

## 2012-10-23 DIAGNOSIS — Z85828 Personal history of other malignant neoplasm of skin: Secondary | ICD-10-CM | POA: Diagnosis not present

## 2012-10-23 DIAGNOSIS — L219 Seborrheic dermatitis, unspecified: Secondary | ICD-10-CM | POA: Diagnosis not present

## 2012-10-24 ENCOUNTER — Other Ambulatory Visit: Payer: Self-pay

## 2012-10-24 DIAGNOSIS — E1159 Type 2 diabetes mellitus with other circulatory complications: Secondary | ICD-10-CM | POA: Diagnosis not present

## 2012-10-24 DIAGNOSIS — I10 Essential (primary) hypertension: Secondary | ICD-10-CM | POA: Diagnosis not present

## 2012-10-24 DIAGNOSIS — Z6830 Body mass index (BMI) 30.0-30.9, adult: Secondary | ICD-10-CM | POA: Diagnosis not present

## 2012-10-24 DIAGNOSIS — Z1331 Encounter for screening for depression: Secondary | ICD-10-CM | POA: Diagnosis not present

## 2012-10-24 DIAGNOSIS — R51 Headache: Secondary | ICD-10-CM | POA: Diagnosis not present

## 2012-11-01 DIAGNOSIS — T7840XA Allergy, unspecified, initial encounter: Secondary | ICD-10-CM | POA: Diagnosis not present

## 2012-11-01 DIAGNOSIS — M353 Polymyalgia rheumatica: Secondary | ICD-10-CM | POA: Diagnosis not present

## 2012-11-01 DIAGNOSIS — M5030 Other cervical disc degeneration, unspecified cervical region: Secondary | ICD-10-CM | POA: Diagnosis not present

## 2012-11-01 DIAGNOSIS — M5137 Other intervertebral disc degeneration, lumbosacral region: Secondary | ICD-10-CM | POA: Diagnosis not present

## 2012-11-29 DIAGNOSIS — Z23 Encounter for immunization: Secondary | ICD-10-CM | POA: Diagnosis not present

## 2012-12-07 DIAGNOSIS — I10 Essential (primary) hypertension: Secondary | ICD-10-CM | POA: Diagnosis not present

## 2012-12-07 DIAGNOSIS — R51 Headache: Secondary | ICD-10-CM | POA: Diagnosis not present

## 2012-12-07 DIAGNOSIS — I739 Peripheral vascular disease, unspecified: Secondary | ICD-10-CM | POA: Diagnosis not present

## 2012-12-07 DIAGNOSIS — IMO0002 Reserved for concepts with insufficient information to code with codable children: Secondary | ICD-10-CM | POA: Diagnosis not present

## 2012-12-07 DIAGNOSIS — E1159 Type 2 diabetes mellitus with other circulatory complications: Secondary | ICD-10-CM | POA: Diagnosis not present

## 2012-12-07 DIAGNOSIS — Z6831 Body mass index (BMI) 31.0-31.9, adult: Secondary | ICD-10-CM | POA: Diagnosis not present

## 2012-12-11 DIAGNOSIS — E1149 Type 2 diabetes mellitus with other diabetic neurological complication: Secondary | ICD-10-CM | POA: Diagnosis not present

## 2012-12-11 DIAGNOSIS — L608 Other nail disorders: Secondary | ICD-10-CM | POA: Diagnosis not present

## 2013-01-24 ENCOUNTER — Other Ambulatory Visit: Payer: Self-pay

## 2013-03-01 ENCOUNTER — Ambulatory Visit (INDEPENDENT_AMBULATORY_CARE_PROVIDER_SITE_OTHER): Payer: Medicare Other | Admitting: Cardiology

## 2013-03-01 ENCOUNTER — Encounter: Payer: Self-pay | Admitting: Cardiology

## 2013-03-01 VITALS — BP 150/92 | HR 67 | Ht 67.0 in | Wt 170.4 lb

## 2013-03-01 DIAGNOSIS — I350 Nonrheumatic aortic (valve) stenosis: Secondary | ICD-10-CM

## 2013-03-01 DIAGNOSIS — E785 Hyperlipidemia, unspecified: Secondary | ICD-10-CM | POA: Diagnosis not present

## 2013-03-01 DIAGNOSIS — I359 Nonrheumatic aortic valve disorder, unspecified: Secondary | ICD-10-CM | POA: Diagnosis not present

## 2013-03-01 DIAGNOSIS — I10 Essential (primary) hypertension: Secondary | ICD-10-CM | POA: Diagnosis not present

## 2013-03-01 NOTE — Assessment & Plan Note (Signed)
Patient statin was discontinued previously because of myalgias. Management per primary care.

## 2013-03-01 NOTE — Assessment & Plan Note (Signed)
Plan repeat echocardiogram. 

## 2013-03-01 NOTE — Assessment & Plan Note (Signed)
Blood pressure mildly elevated but typically controlled at home. Continue present dose of benazepril. Potassium and renal function monitored by primary care.

## 2013-03-01 NOTE — Patient Instructions (Signed)

## 2013-03-01 NOTE — Progress Notes (Signed)
HPI: fu hypertension and AS. Myoview in 2008 showed EF 59 and normal perfusion. Echocardiogram in December of 2012 showed an ejection fraction of 50-55%, mild left ventricular hypertrophy, grade 1 diastolic dysfunction, mild aortic stenosis (mean gradient 16 mmHg) and insufficiency, mild mitral regurgitation. Patient last seen in Dec 2013. Since then, the patient denies any dyspnea on exertion, orthopnea, PND, palpitations, syncope or chest pain. Some pedal edema from venous insufficiency.   Current Outpatient Prescriptions  Medication Sig Dispense Refill  . buPROPion (WELLBUTRIN) 100 MG tablet Take 100 mg by mouth every evening.       . docusate sodium (COLACE) 100 MG capsule Take 200 mg by mouth daily.      . fish oil-omega-3 fatty acids 1000 MG capsule Take 1 g by mouth daily.        Marland Kitchen glipiZIDE (GLUCOTROL) 5 MG tablet Take 5 mg by mouth daily before breakfast.      . metFORMIN (GLUCOPHAGE) 1000 MG tablet Take 500 mg by mouth 4 (four) times daily - after meals and at bedtime.       . multivitamin-lutein (OCUVITE-LUTEIN) CAPS capsule Take 1 capsule by mouth daily.      . polyethylene glycol (MIRALAX / GLYCOLAX) packet Take 17 g by mouth daily as needed.       Marland Kitchen PREDNISONE PO Take 2 g by mouth daily. As directed.      . Psyllium (METAMUCIL PO) Take 5 mLs by mouth every other day. ONLY TAKING ONCE OR TWICE A MONTH      . Pyridoxine HCl (VITAMIN B6 PO) Take 1 tablet by mouth as needed.       . RABEprazole (ACIPHEX) 20 MG tablet Take 20 mg by mouth daily.         No current facility-administered medications for this visit.     Past Medical History  Diagnosis Date  . Diabetes mellitus   . HTN (hypertension)   . Hyperlipemia   . Spinal stenosis   . Thalassemia   . History of Lyme disease   . PTSD (post-traumatic stress disorder)   . Cancer     left cheek  . Hemorrhoids 02/03/2005    Internal and external  . Diverticulosis of colon (without mention of hemorrhage) 02/03/2005  .  Personal history of colonic polyps   . Benign neoplasm of colon   . Polymyalgia rheumatica   . Varicose veins   . Aortic stenosis     Past Surgical History  Procedure Laterality Date  . Cataract extraction, bilateral    . Kidney stone surgery    . Hemorroidectomy      History   Social History  . Marital Status: Married    Spouse Name: N/A    Number of Children: N/A  . Years of Education: N/A   Occupational History  . Not on file.   Social History Main Topics  . Smoking status: Current Every Day Smoker -- 78 years    Types: Cigars  . Smokeless tobacco: Never Used     Comment: pt states that he does not inhale  . Alcohol Use: No  . Drug Use: No  . Sexual Activity: Not on file   Other Topics Concern  . Not on file   Social History Narrative  . No narrative on file    ROS: no fevers or chills, productive cough, hemoptysis, dysphasia, odynophagia, melena, hematochezia, dysuria, hematuria, rash, seizure activity, orthopnea, PND, pedal edema, claudication. Remaining systems are negative.  Physical Exam: Well-developed well-nourished in no acute distress.  Skin is warm and dry.  HEENT is normal.  Neck is supple.  Chest is clear to auscultation with normal expansion.  Cardiovascular exam is regular rate and rhythm. 2/6 systolic murmur left sternal border. S2 is diminished. Abdominal exam nontender or distended. No masses palpated. Extremities show no edema. neuro grossly intact  ECG sinus rhythm at a rate of 62. First-degree AV block. Left axis deviation. Nonspecific ST changes.

## 2013-03-12 ENCOUNTER — Ambulatory Visit (INDEPENDENT_AMBULATORY_CARE_PROVIDER_SITE_OTHER): Payer: Medicare Other

## 2013-03-12 VITALS — BP 158/84 | HR 64 | Resp 12

## 2013-03-12 DIAGNOSIS — E1149 Type 2 diabetes mellitus with other diabetic neurological complication: Secondary | ICD-10-CM | POA: Diagnosis not present

## 2013-03-12 DIAGNOSIS — E1140 Type 2 diabetes mellitus with diabetic neuropathy, unspecified: Secondary | ICD-10-CM

## 2013-03-12 DIAGNOSIS — E1142 Type 2 diabetes mellitus with diabetic polyneuropathy: Secondary | ICD-10-CM | POA: Diagnosis not present

## 2013-03-12 DIAGNOSIS — L608 Other nail disorders: Secondary | ICD-10-CM | POA: Diagnosis not present

## 2013-03-12 NOTE — Progress Notes (Signed)
   Subjective:    Patient ID: Antonio Buchanan, male    DOB: [birth year], 79 y.o.   MRN: [id number]  HPI Comments: '' TOENAILS TRIM.''     Review of Systems  Skin: Positive for color change.  All other systems reviewed and are negative.       Objective:   Physical Exam Neurovascular status is intact as follows dorsalis pedis pulse one over 4 PT nonpalpable on the left plus one over 4 on the right absent hair growth diminished skin texture cool temperature diminished turgor noted neurologically epicritic and proprioceptive sensations diminished on Semmes Weinstein testing to forefoot digits and plantar arch bilateral. Orthopedic biomechanical exam unremarkable noncontributory mild digital contractures are noted. Dermatologically skin color pigment normal hair growth absent nails thick criptotic incurvated and brittle one through 5 bilateral. No new findings no other contributing factors. No signs of infection no open wounds or ulcers noted.       Assessment & Plan:  Assessment this time diabetes with peripheral neuropathy. Debridement of dystrophic friable criptotic nails 1 through 5 bilateral. Return for future palliative and nail care in 3 months for an as-needed basis next  Alvan Dame DPM

## 2013-03-12 NOTE — Patient Instructions (Signed)
Diabetes and Foot Care Diabetes may cause you to have problems because of poor blood supply (circulation) to your feet and legs. This may cause the skin on your feet to become thinner, break easier, and heal more slowly. Your skin may become dry, and the skin may peel and crack. You may also have nerve damage in your legs and feet causing decreased feeling in them. You may not notice minor injuries to your feet that could lead to infections or more serious problems. Taking care of your feet is one of the most important things you can do for yourself.  HOME CARE INSTRUCTIONS  Wear shoes at all times, even in the house. Do not go barefoot. Bare feet are easily injured.  Check your feet daily for blisters, cuts, and redness. If you cannot see the bottom of your feet, use a mirror or ask someone for help.  Wash your feet with warm water (do not use hot water) and mild soap. Then pat your feet and the areas between your toes until they are completely dry. Do not soak your feet as this can dry your skin.  Apply a moisturizing lotion or petroleum jelly (that does not contain alcohol and is unscented) to the skin on your feet and to dry, brittle toenails. Do not apply lotion between your toes.  Trim your toenails straight across. Do not dig under them or around the cuticle. File the edges of your nails with an emery board or nail file.  Do not cut corns or calluses or try to remove them with medicine.  Wear clean socks or stockings every day. Make sure they are not too tight. Do not wear knee-high stockings since they may decrease blood flow to your legs.  Wear shoes that fit properly and have enough cushioning. To break in new shoes, wear them for just a few hours a day. This prevents you from injuring your feet. Always look in your shoes before you put them on to be sure there are no objects inside.  Do not cross your legs. This may decrease the blood flow to your feet.  If you find a minor scrape,  cut, or break in the skin on your feet, keep it and the skin around it clean and dry. These areas may be cleansed with mild soap and water. Do not cleanse the area with peroxide, alcohol, or iodine.  When you remove an adhesive bandage, be sure not to damage the skin around it.  If you have a wound, look at it several times a day to make sure it is healing.  Do not use heating pads or hot water bottles. They may burn your skin. If you have lost feeling in your feet or legs, you may not know it is happening until it is too late.  Make sure your health care provider performs a complete foot exam at least annually or more often if you have foot problems. Report any cuts, sores, or bruises to your health care provider immediately. SEEK MEDICAL CARE IF:   You have an injury that is not healing.  You have cuts or breaks in the skin.  You have an ingrown nail.  You notice redness on your legs or feet.  You feel burning or tingling in your legs or feet.  You have pain or cramps in your legs and feet.  Your legs or feet are numb.  Your feet always feel cold. SEEK IMMEDIATE MEDICAL CARE IF:   There is increasing redness,   swelling, or pain in or around a wound.  There is a red line that goes up your leg.  Pus is coming from a wound.  You develop a fever or as directed by your health care provider.  You notice a bad smell coming from an ulcer or wound. Document Released: 03/04/2000 Document Revised: 11/07/2012 Document Reviewed: 08/14/2012 [employer] Patient Information 2014 [employer].  

## 2013-03-20 ENCOUNTER — Ambulatory Visit (HOSPITAL_COMMUNITY): Payer: Medicare Other | Attending: Cardiology | Admitting: Cardiology

## 2013-03-20 DIAGNOSIS — I350 Nonrheumatic aortic (valve) stenosis: Secondary | ICD-10-CM

## 2013-03-20 DIAGNOSIS — E119 Type 2 diabetes mellitus without complications: Secondary | ICD-10-CM | POA: Insufficient documentation

## 2013-03-20 DIAGNOSIS — I079 Rheumatic tricuspid valve disease, unspecified: Secondary | ICD-10-CM | POA: Diagnosis not present

## 2013-03-20 DIAGNOSIS — I359 Nonrheumatic aortic valve disorder, unspecified: Secondary | ICD-10-CM | POA: Diagnosis not present

## 2013-03-20 DIAGNOSIS — F17200 Nicotine dependence, unspecified, uncomplicated: Secondary | ICD-10-CM | POA: Diagnosis not present

## 2013-03-20 DIAGNOSIS — I10 Essential (primary) hypertension: Secondary | ICD-10-CM | POA: Diagnosis not present

## 2013-03-20 DIAGNOSIS — E785 Hyperlipidemia, unspecified: Secondary | ICD-10-CM | POA: Diagnosis not present

## 2013-03-20 DIAGNOSIS — I080 Rheumatic disorders of both mitral and aortic valves: Secondary | ICD-10-CM | POA: Diagnosis not present

## 2013-03-20 NOTE — Progress Notes (Signed)
Echo performed. 

## 2013-03-26 ENCOUNTER — Other Ambulatory Visit: Payer: Self-pay | Admitting: *Deleted

## 2013-03-26 ENCOUNTER — Encounter: Payer: Self-pay | Admitting: *Deleted

## 2013-03-26 DIAGNOSIS — I359 Nonrheumatic aortic valve disorder, unspecified: Secondary | ICD-10-CM

## 2013-03-26 DIAGNOSIS — R9430 Abnormal result of cardiovascular function study, unspecified: Secondary | ICD-10-CM

## 2013-04-01 ENCOUNTER — Telehealth: Payer: Self-pay | Admitting: Cardiology

## 2013-04-01 NOTE — Telephone Encounter (Signed)
Follow up    Patient calling need to speak with nurse.

## 2013-04-01 NOTE — Telephone Encounter (Signed)
Spoke with pt, instructions regarding dobutamine echo discussed

## 2013-04-01 NOTE — Telephone Encounter (Signed)
New message        Pt has detailed questions about his echo. Would like for the technician to give him a call.

## 2013-04-10 DIAGNOSIS — Z125 Encounter for screening for malignant neoplasm of prostate: Secondary | ICD-10-CM | POA: Diagnosis not present

## 2013-04-10 DIAGNOSIS — E785 Hyperlipidemia, unspecified: Secondary | ICD-10-CM | POA: Diagnosis not present

## 2013-04-10 DIAGNOSIS — E1159 Type 2 diabetes mellitus with other circulatory complications: Secondary | ICD-10-CM | POA: Diagnosis not present

## 2013-04-10 DIAGNOSIS — I10 Essential (primary) hypertension: Secondary | ICD-10-CM | POA: Diagnosis not present

## 2013-04-11 DIAGNOSIS — M19049 Primary osteoarthritis, unspecified hand: Secondary | ICD-10-CM | POA: Diagnosis not present

## 2013-04-11 DIAGNOSIS — M353 Polymyalgia rheumatica: Secondary | ICD-10-CM | POA: Diagnosis not present

## 2013-04-11 DIAGNOSIS — M5030 Other cervical disc degeneration, unspecified cervical region: Secondary | ICD-10-CM | POA: Diagnosis not present

## 2013-04-11 DIAGNOSIS — M5137 Other intervertebral disc degeneration, lumbosacral region: Secondary | ICD-10-CM | POA: Diagnosis not present

## 2013-04-15 ENCOUNTER — Ambulatory Visit (HOSPITAL_COMMUNITY): Payer: Medicare Other

## 2013-04-15 ENCOUNTER — Encounter (HOSPITAL_COMMUNITY): Payer: Self-pay | Admitting: *Deleted

## 2013-04-15 ENCOUNTER — Other Ambulatory Visit: Payer: Self-pay | Admitting: *Deleted

## 2013-04-15 MED ORDER — AMLODIPINE BESYLATE 5 MG PO TABS: 5.0000 mg | ORAL_TABLET | Freq: Every day | ORAL | Status: DC

## 2013-04-15 NOTE — Progress Notes (Signed)
Patient arrived for Dobutamine stress Echo today. IV started by R Hand with # 22g angio x 1.Patient BP was 198/106 when assesed. Repeated BP was 195/106. Dr Stanford Breed was consulted with the HTN. Patient test cancelled, BP medication to be started and once BP undercontrol test to be rescheduled. Patient instructed of Norvasc to be started and check BP daily. Patient stated he takes Benazepril daily but this medication was not on his med list.

## 2013-04-17 DIAGNOSIS — E1159 Type 2 diabetes mellitus with other circulatory complications: Secondary | ICD-10-CM | POA: Diagnosis not present

## 2013-04-17 DIAGNOSIS — I739 Peripheral vascular disease, unspecified: Secondary | ICD-10-CM | POA: Diagnosis not present

## 2013-04-17 DIAGNOSIS — Z79899 Other long term (current) drug therapy: Secondary | ICD-10-CM | POA: Diagnosis not present

## 2013-04-17 DIAGNOSIS — M5030 Other cervical disc degeneration, unspecified cervical region: Secondary | ICD-10-CM | POA: Diagnosis not present

## 2013-04-17 DIAGNOSIS — E785 Hyperlipidemia, unspecified: Secondary | ICD-10-CM | POA: Diagnosis not present

## 2013-04-17 DIAGNOSIS — I10 Essential (primary) hypertension: Secondary | ICD-10-CM | POA: Diagnosis not present

## 2013-04-17 DIAGNOSIS — IMO0001 Reserved for inherently not codable concepts without codable children: Secondary | ICD-10-CM | POA: Diagnosis not present

## 2013-04-23 ENCOUNTER — Telehealth: Payer: Self-pay

## 2013-04-23 NOTE — Telephone Encounter (Signed)
Wife called to discuss pt's needs with having problems with skin of left leg drying out.  States he is receiving leg massaging with oil 3x/week.  Requesting a letter from Dr. Donnetta Hutching to state pt's status and need for long term care of the venous insufficiency issue.  Advised that pt. hasn't been seen by Dr. Donnetta Hutching since June 2014, and would need to be reevaluated to determine his current needs.  Wife agreed and said she would like to move his June appt. to an earlier date anyway.  States she has questions about stockings; will have the vein nurse call the wife and pt. back

## 2013-04-24 ENCOUNTER — Telehealth: Payer: Self-pay | Admitting: *Deleted

## 2013-04-24 NOTE — Telephone Encounter (Signed)
Spoke with pt.s wife regarding needing a letter from Dr. Donnetta Hutching to state pt.'s status and need for long term care of the venous insufficiency issue. Since pt. Has not been seen by Dr. Donnetta Hutching since June 2014, recommended moving up scheduled visit of 09-17-2013 so Dr. Donnetta Hutching can reevaluate and determine his current needs.  Will instruct scheduler to call pt.'s wife with appointment.  Pt.'s wife in aggrement and verbalized understanding.

## 2013-04-29 ENCOUNTER — Encounter: Payer: Self-pay | Admitting: Vascular Surgery

## 2013-04-30 ENCOUNTER — Ambulatory Visit (INDEPENDENT_AMBULATORY_CARE_PROVIDER_SITE_OTHER): Payer: Medicare Other | Admitting: Vascular Surgery

## 2013-04-30 ENCOUNTER — Encounter: Payer: Self-pay | Admitting: Vascular Surgery

## 2013-04-30 VITALS — BP 160/86 | HR 68 | Ht 67.0 in | Wt 164.0 lb

## 2013-04-30 DIAGNOSIS — I87309 Chronic venous hypertension (idiopathic) without complications of unspecified lower extremity: Secondary | ICD-10-CM | POA: Diagnosis not present

## 2013-04-30 NOTE — Progress Notes (Signed)
The patient presents today for continued followup of his chronic venous hypertension. This is greater on his left leg in his right. He has been seen for many years with known venous hypertension. He has been extremely compliant with elevation and graduated compression and this is reduced his frequency of venous ulceration. Fortunately he has had no recent venous ulceration. He is seen on a yearly basis to confirm that he is disabled secondary to his hypertension and he is to continue disabled. He is approaching his 89th birthday in 2 months. He is having a very difficult time applying his compression. Home health is assisting you now and this is preventing him from having recurrent ulceration  Past Medical History  Diagnosis Date  . Diabetes mellitus   . HTN (hypertension)   . Hyperlipemia   . Spinal stenosis   . Thalassemia   . History of Lyme disease   . PTSD (post-traumatic stress disorder)   . Cancer     left cheek  . Hemorrhoids 02/03/2005    Internal and external  . Diverticulosis of colon (without mention of hemorrhage) 02/03/2005  . Personal history of colonic polyps   . Benign neoplasm of colon   . Polymyalgia rheumatica   . Varicose veins   . Aortic stenosis     History  Substance Use Topics  . Smoking status: Current Every Day Smoker -- 78 years    Types: Cigars  . Smokeless tobacco: Never Used     Comment: pt states that he does not inhale  . Alcohol Use: No    Family History  Problem Relation Age of Onset  . Diabetes Father   . Leukemia Mother   . Hypertension Mother   . Arthritis Mother   . Diabetes Sister     Allergies  Allergen Reactions  . Lamisil Af Defense [Tolnaftate]     Severe hives    Current outpatient prescriptions:amLODipine (NORVASC) 5 MG tablet, Take 1 tablet (5 mg total) by mouth daily., Disp: 180 tablet, Rfl: 3;  buPROPion (WELLBUTRIN) 100 MG tablet, Take 100 mg by mouth every evening. , Disp: , Rfl: ;  docusate sodium (COLACE) 100 MG  capsule, Take 200 mg by mouth daily., Disp: , Rfl: ;  fish oil-omega-3 fatty acids 1000 MG capsule, Take 1 g by mouth daily.  , Disp: , Rfl:  glipiZIDE (GLUCOTROL) 5 MG tablet, Take 5 mg by mouth daily before breakfast., Disp: , Rfl: ;  metFORMIN (GLUCOPHAGE) 1000 MG tablet, Take 500 mg by mouth 4 (four) times daily - after meals and at bedtime. , Disp: , Rfl: ;  multivitamin-lutein (OCUVITE-LUTEIN) CAPS capsule, Take 1 capsule by mouth daily., Disp: , Rfl: ;  polyethylene glycol (MIRALAX / GLYCOLAX) packet, Take 17 g by mouth daily as needed. , Disp: , Rfl:  PREDNISONE PO, Take 2 g by mouth daily. As directed., Disp: , Rfl: ;  Psyllium (METAMUCIL PO), Take 5 mLs by mouth every other day. ONLY TAKING ONCE OR TWICE A MONTH, Disp: , Rfl: ;  Pyridoxine HCl (VITAMIN B6 PO), Take 1 tablet by mouth as needed. , Disp: , Rfl: ;  RABEprazole (ACIPHEX) 20 MG tablet, Take 20 mg by mouth daily.  , Disp: , Rfl:   BP 160/86  Pulse 68  Ht 5\' 7"  (1.702 m)  Wt 164 lb (74.39 kg)  BMI 25.68 kg/m2  SpO2 99%  Body mass index is 25.68 kg/(m^2).       Physical exam alert oriented gentleman in no acute distress  Neurologically he is grossly intact Lower extremity are noted for marked venous hypertension changes with varicosities and hemosiderin deposit circumferentially in his left leg from his calf down to his ankle. He does have a rash on his dorsum of his right foot does not appear to be related to venous hypertension.  Impression and plan: Severe venous hypertension left greater than right. I do feel the patient is disabled secondary to this. Also feel that he needs assistance with home health to continue his compliant with compression since it is becoming more and more difficult for him to place the compression stockings on himself. He will see [place] again in one year

## 2013-05-02 DIAGNOSIS — L821 Other seborrheic keratosis: Secondary | ICD-10-CM | POA: Diagnosis not present

## 2013-05-02 DIAGNOSIS — L408 Other psoriasis: Secondary | ICD-10-CM | POA: Diagnosis not present

## 2013-05-13 ENCOUNTER — Encounter: Payer: Self-pay | Admitting: Cardiology

## 2013-05-13 ENCOUNTER — Other Ambulatory Visit (HOSPITAL_COMMUNITY): Payer: Medicare Other | Admitting: *Deleted

## 2013-05-13 ENCOUNTER — Ambulatory Visit (HOSPITAL_COMMUNITY): Payer: Medicare Other | Attending: Cardiology | Admitting: Radiology

## 2013-05-13 ENCOUNTER — Encounter (HOSPITAL_COMMUNITY): Payer: Self-pay | Admitting: *Deleted

## 2013-05-13 DIAGNOSIS — E785 Hyperlipidemia, unspecified: Secondary | ICD-10-CM | POA: Diagnosis not present

## 2013-05-13 DIAGNOSIS — I10 Essential (primary) hypertension: Secondary | ICD-10-CM | POA: Diagnosis not present

## 2013-05-13 DIAGNOSIS — I350 Nonrheumatic aortic (valve) stenosis: Secondary | ICD-10-CM

## 2013-05-13 DIAGNOSIS — I359 Nonrheumatic aortic valve disorder, unspecified: Secondary | ICD-10-CM | POA: Insufficient documentation

## 2013-05-13 DIAGNOSIS — E119 Type 2 diabetes mellitus without complications: Secondary | ICD-10-CM | POA: Diagnosis not present

## 2013-05-13 MED ORDER — SODIUM CHLORIDE 0.9 % IV SOLN
20.0000 ug/kg | Freq: Once | INTRAVENOUS | Status: AC
  Administered 2013-05-13: 20 ug/kg/min via INTRAVENOUS

## 2013-05-13 NOTE — Progress Notes (Signed)
Dobutamine stress AS test performed.

## 2013-05-13 NOTE — Progress Notes (Unsigned)
IV started via Right AC with # 22g by Allen Derry. Patient tolerated well. BP 111/78,150 cc started by PIV prior to Dobutamine stress echo. No CBG obtained at home and patient took Metformin this am. NPO at 1100 today. Cindy Daijanae Rafalski,RN

## 2013-05-15 ENCOUNTER — Encounter (HOSPITAL_COMMUNITY): Payer: Self-pay | Admitting: Cardiology

## 2013-05-21 ENCOUNTER — Telehealth: Payer: Self-pay | Admitting: Cardiology

## 2013-05-21 NOTE — Telephone Encounter (Signed)
New message    Son calling for echo test results.

## 2013-05-21 NOTE — Telephone Encounter (Signed)
Spoke with pt, questions answered regarding dx of aortic stenosis

## 2013-06-07 ENCOUNTER — Ambulatory Visit (INDEPENDENT_AMBULATORY_CARE_PROVIDER_SITE_OTHER): Payer: Medicare Other | Admitting: Cardiology

## 2013-06-07 ENCOUNTER — Encounter: Payer: Self-pay | Admitting: Cardiology

## 2013-06-07 VITALS — BP 106/90 | HR 70 | Ht 67.0 in | Wt 165.0 lb

## 2013-06-07 DIAGNOSIS — I359 Nonrheumatic aortic valve disorder, unspecified: Secondary | ICD-10-CM | POA: Diagnosis not present

## 2013-06-07 DIAGNOSIS — I350 Nonrheumatic aortic (valve) stenosis: Secondary | ICD-10-CM

## 2013-06-07 DIAGNOSIS — I10 Essential (primary) hypertension: Secondary | ICD-10-CM | POA: Diagnosis not present

## 2013-06-07 DIAGNOSIS — E785 Hyperlipidemia, unspecified: Secondary | ICD-10-CM | POA: Diagnosis not present

## 2013-06-07 NOTE — Progress Notes (Signed)
HPI: fu hypertension and AS. Myoview in 2008 showed EF 59 and normal perfusion. Echocardiogram in December of 2014 revealed an ejection fraction of 35-40%, mild left ventricular hypertrophy, grade 1 diastolic dysfunction, severe aortic stenosis calculated valve area but mean gradient 19 mm of mercury, mildly dilated aortic root and mild left atrial enlargement. Dobutamine echocardiogram in February 2014 was felt consistent with severe aortic stenosis; low normal LV function. Patient last seen in Dec 2014. Since then, he denies dyspnea, chest pain, palpitations or syncope.   Current Outpatient Prescriptions  Medication Sig Dispense Refill  . amLODipine (NORVASC) 5 MG tablet Take 1 tablet (5 mg total) by mouth daily.  180 tablet  3  . aspirin EC 81 MG tablet Take 81 mg by mouth daily.      Marland Kitchen buPROPion (WELLBUTRIN) 100 MG tablet Take 100 mg by mouth every evening.       . docusate sodium (COLACE) 100 MG capsule Take 200 mg by mouth daily.      . fish oil-omega-3 fatty acids 1000 MG capsule Take 1 g by mouth daily.        Marland Kitchen glipiZIDE (GLUCOTROL) 5 MG tablet Take 5 mg by mouth daily before breakfast.      . metFORMIN (GLUCOPHAGE) 1000 MG tablet Take 500 mg by mouth 4 (four) times daily - after meals and at bedtime.       . multivitamin-lutein (OCUVITE-LUTEIN) CAPS capsule Take 1 capsule by mouth daily.      . polyethylene glycol (MIRALAX / GLYCOLAX) packet Take 17 g by mouth daily as needed.       . Psyllium (METAMUCIL PO) Take 5 mLs by mouth every other day. ONLY TAKING ONCE OR TWICE A MONTH      . Pyridoxine HCl (VITAMIN B6 PO) Take 1 tablet by mouth as needed.       . RABEprazole (ACIPHEX) 20 MG tablet Take 20 mg by mouth daily.         No current facility-administered medications for this visit.     Past Medical History  Diagnosis Date  . Diabetes mellitus   . HTN (hypertension)   . Hyperlipemia   . Spinal stenosis   . Thalassemia   . History of Lyme disease   . PTSD  (post-traumatic stress disorder)   . Cancer     left cheek  . Hemorrhoids 02/03/2005    Internal and external  . Diverticulosis of colon (without mention of hemorrhage) 02/03/2005  . Personal history of colonic polyps   . Benign neoplasm of colon   . Polymyalgia rheumatica   . Varicose veins   . Aortic stenosis     Past Surgical History  Procedure Laterality Date  . Cataract extraction, bilateral    . Kidney stone surgery    . Hemorroidectomy      History   Social History  . Marital Status: Married    Spouse Name: N/A    Number of Children: N/A  . Years of Education: N/A   Occupational History  . Not on file.   Social History Main Topics  . Smoking status: Current Every Day Smoker -- 78 years    Types: Cigars  . Smokeless tobacco: Never Used     Comment: pt states that he does not inhale  . Alcohol Use: No  . Drug Use: No  . Sexual Activity: Not on file   Other Topics Concern  . Not on file   Social History Narrative  .  No narrative on file    ROS: no fevers or chills, productive cough, hemoptysis, dysphasia, odynophagia, melena, hematochezia, dysuria, hematuria, rash, seizure activity, orthopnea, PND, pedal edema, claudication. Remaining systems are negative.  Physical Exam: Well-developed well-nourished in no acute distress.  Skin is warm and dry.  HEENT is normal.  Neck is supple.  Chest is clear to auscultation with normal expansion.  Cardiovascular exam is regular rate and rhythm. 3/6 systolic murmur left sternal border. S2 is not diminished. Abdominal exam nontender or distended. No masses palpated. Extremities show no edema. neuro grossly intact

## 2013-06-07 NOTE — Assessment & Plan Note (Signed)
Blood pressure controlled. Continue present medications. 

## 2013-06-07 NOTE — Patient Instructions (Signed)
Your physician recommends that you schedule a follow-up appointment in: June AFTER ECHO  Your physician has requested that you have an echocardiogram. Echocardiography is a painless test that uses sound waves to create images of your heart. It provides your doctor with information about the size and shape of your heart and how well your heart's chambers and valves are working. This procedure takes approximately one hour. There are no restrictions for this procedure.  SCHEDULE IN [place]

## 2013-06-07 NOTE — Assessment & Plan Note (Signed)
Patient's aortic stenosis on echo appears to be severe based on continuity equation but mean gradient 19 mm of mercury. His EF was approximately 40% but appeared better on dobutamine study. Dobutamine echo also suggestive of severe aortic stenosis. Clinically it appears to be more moderate. S2 is not diminished. He is not having symptoms. Long discussion with he and his family today concerning options. One would be to proceed with cardiac catheterization to further assess severity of aortic stenosis. Alternatively we could repeat his echocardiogram in June and if he develops symptoms consider referral for surgery or TAVR. Given his age we have elected to be conservative if possible. He is in agreement with that. Repeat echocardiogram in June. We will make changes that plan if he develops symptoms. They understand small risk of sudden death associated with severe AS.

## 2013-06-07 NOTE — Assessment & Plan Note (Signed)
Management per primary care. 

## 2013-06-11 ENCOUNTER — Telehealth: Payer: Self-pay | Admitting: Cardiology

## 2013-06-11 NOTE — Telephone Encounter (Signed)
F/u   Pt called back again concerning his Open Heart Surgery. Pt want to know why is he having an Echocardiogram.

## 2013-06-11 NOTE — Telephone Encounter (Signed)
Spoke with pt wife, Aware of dr crenshaw's recommendations.  

## 2013-06-11 NOTE — Telephone Encounter (Signed)
Would not pursue cath at this point; fu as scheduled. Kirk Ruths

## 2013-06-11 NOTE — Telephone Encounter (Signed)
Spoke with pt, he is wanting to go ahead with the cath. And see the surgeon to discuss surgery on the aortic valve. He wants to make sure dr Stanford Breed knows his son has thallosemia, the pt is not aware if he is carrier or has the same blood disorder. Will forward for dr Stanford Breed review

## 2013-06-11 NOTE — Telephone Encounter (Signed)
New message  Pt called states that he is having Open Heart surgery// Pt is requesting a call back to discuss the details.. Please call.

## 2013-06-25 ENCOUNTER — Ambulatory Visit (INDEPENDENT_AMBULATORY_CARE_PROVIDER_SITE_OTHER): Payer: Medicare Other

## 2013-06-25 VITALS — BP 146/77 | HR 61 | Resp 18 | Ht 66.5 in | Wt 171.0 lb

## 2013-06-25 DIAGNOSIS — E1140 Type 2 diabetes mellitus with diabetic neuropathy, unspecified: Secondary | ICD-10-CM

## 2013-06-25 DIAGNOSIS — L608 Other nail disorders: Secondary | ICD-10-CM

## 2013-06-25 DIAGNOSIS — E1142 Type 2 diabetes mellitus with diabetic polyneuropathy: Secondary | ICD-10-CM

## 2013-06-25 DIAGNOSIS — E1149 Type 2 diabetes mellitus with other diabetic neurological complication: Secondary | ICD-10-CM | POA: Diagnosis not present

## 2013-06-25 NOTE — Patient Instructions (Signed)
Diabetes and Foot Care Diabetes may cause you to have problems because of poor blood supply (circulation) to your feet and legs. This may cause the skin on your feet to become thinner, break easier, and heal more slowly. Your skin may become dry, and the skin may peel and crack. You may also have nerve damage in your legs and feet causing decreased feeling in them. You may not notice minor injuries to your feet that could lead to infections or more serious problems. Taking care of your feet is one of the most important things you can do for yourself.  HOME CARE INSTRUCTIONS  Wear shoes at all times, even in the house. Do not go barefoot. Bare feet are easily injured.  Check your feet daily for blisters, cuts, and redness. If you cannot see the bottom of your feet, use a mirror or ask someone for help.  Wash your feet with warm water (do not use hot water) and mild soap. Then pat your feet and the areas between your toes until they are completely dry. Do not soak your feet as this can dry your skin.  Apply a moisturizing lotion or petroleum jelly (that does not contain alcohol and is unscented) to the skin on your feet and to dry, brittle toenails. Do not apply lotion between your toes.  Trim your toenails straight across. Do not dig under them or around the cuticle. File the edges of your nails with an emery board or nail file.  Do not cut corns or calluses or try to remove them with medicine.  Wear clean socks or stockings every day. Make sure they are not too tight. Do not wear knee-high stockings since they may decrease blood flow to your legs.  Wear shoes that fit properly and have enough cushioning. To break in new shoes, wear them for just a few hours a day. This prevents you from injuring your feet. Always look in your shoes before you put them on to be sure there are no objects inside.  Do not cross your legs. This may decrease the blood flow to your feet.  If you find a minor scrape,  cut, or break in the skin on your feet, keep it and the skin around it clean and dry. These areas may be cleansed with mild soap and water. Do not cleanse the area with peroxide, alcohol, or iodine.  When you remove an adhesive bandage, be sure not to damage the skin around it.  If you have a wound, look at it several times a day to make sure it is healing.  Do not use heating pads or hot water bottles. They may burn your skin. If you have lost feeling in your feet or legs, you may not know it is happening until it is too late.  Make sure your health care provider performs a complete foot exam at least annually or more often if you have foot problems. Report any cuts, sores, or bruises to your health care provider immediately. SEEK MEDICAL CARE IF:   You have an injury that is not healing.  You have cuts or breaks in the skin.  You have an ingrown nail.  You notice redness on your legs or feet.  You feel burning or tingling in your legs or feet.  You have pain or cramps in your legs and feet.  Your legs or feet are numb.  Your feet always feel cold. SEEK IMMEDIATE MEDICAL CARE IF:   There is increasing redness,   swelling, or pain in or around a wound.  There is a red line that goes up your leg.  Pus is coming from a wound.  You develop a fever or as directed by your health care provider.  You notice a bad smell coming from an ulcer or wound. Document Released: 03/04/2000 Document Revised: 11/07/2012 Document Reviewed: 08/14/2012 [employer] Patient Information 2014 [employer].  

## 2013-06-25 NOTE — Progress Notes (Signed)
   Subjective:    Patient ID: Karanvir Balderston, male    DOB: 01-06-1922, 80 y.o.   MRN: [id number] Pt presents for debridement of B/L 1 - 5 toenails. HPI    Review of Systems no new changes or findings     Objective:   Physical Exam Lower extremity objective findings as follows vascular status is intact DP plus one over 4 PT nonpalpable bilateral mild varicosities and some venous stasis noted left more so than right patient does have absent hair growth diminished skin texture and turgor nails thick brittle crumbly dystrophic criptotic orthopedic biomechanical exam unremarkable there is mild digital contractures and arthrosis the toes hair growth absent nails friable brittle one through 5 bilateral. No open wounds no secondary infections at current time. Epicritic and proprioceptive sensations diminished on Semmes Weinstein testing to the digits and plantar foot       Assessment & Plan:  Assessment diabetes with complications and peripheral neuropathy. Dystrophic friable criptotic nails debrided 1 through 5 bilateral return for future palliative care and as-needed basis suggest 3 month followup for nail care  Harriet Masson DPM

## 2013-06-27 DIAGNOSIS — Z6829 Body mass index (BMI) 29.0-29.9, adult: Secondary | ICD-10-CM | POA: Diagnosis not present

## 2013-06-27 DIAGNOSIS — E1159 Type 2 diabetes mellitus with other circulatory complications: Secondary | ICD-10-CM | POA: Diagnosis not present

## 2013-06-27 DIAGNOSIS — I739 Peripheral vascular disease, unspecified: Secondary | ICD-10-CM | POA: Diagnosis not present

## 2013-06-27 DIAGNOSIS — I359 Nonrheumatic aortic valve disorder, unspecified: Secondary | ICD-10-CM | POA: Diagnosis not present

## 2013-06-27 DIAGNOSIS — I10 Essential (primary) hypertension: Secondary | ICD-10-CM | POA: Diagnosis not present

## 2013-06-27 DIAGNOSIS — M48061 Spinal stenosis, lumbar region without neurogenic claudication: Secondary | ICD-10-CM | POA: Diagnosis not present

## 2013-07-22 ENCOUNTER — Emergency Department (HOSPITAL_COMMUNITY)
Admission: EM | Admit: 2013-07-22 | Discharge: 2013-07-22 | Disposition: A | Discharge status: Home / Self Care | Payer: Medicare Other | Attending: Emergency Medicine | Admitting: Emergency Medicine

## 2013-07-22 ENCOUNTER — Encounter (HOSPITAL_COMMUNITY): Payer: Self-pay | Admitting: Emergency Medicine

## 2013-07-22 DIAGNOSIS — E785 Hyperlipidemia, unspecified: Secondary | ICD-10-CM | POA: Diagnosis not present

## 2013-07-22 DIAGNOSIS — I10 Essential (primary) hypertension: Secondary | ICD-10-CM | POA: Insufficient documentation

## 2013-07-22 DIAGNOSIS — F17200 Nicotine dependence, unspecified, uncomplicated: Secondary | ICD-10-CM | POA: Diagnosis not present

## 2013-07-22 DIAGNOSIS — Z8589 Personal history of malignant neoplasm of other organs and systems: Secondary | ICD-10-CM | POA: Insufficient documentation

## 2013-07-22 DIAGNOSIS — Z8719 Personal history of other diseases of the digestive system: Secondary | ICD-10-CM | POA: Diagnosis not present

## 2013-07-22 DIAGNOSIS — R1110 Vomiting, unspecified: Secondary | ICD-10-CM | POA: Insufficient documentation

## 2013-07-22 DIAGNOSIS — Z862 Personal history of diseases of the blood and blood-forming organs and certain disorders involving the immune mechanism: Secondary | ICD-10-CM | POA: Diagnosis not present

## 2013-07-22 DIAGNOSIS — Z860100 Personal history of colon polyps, unspecified: Secondary | ICD-10-CM | POA: Insufficient documentation

## 2013-07-22 DIAGNOSIS — Z79899 Other long term (current) drug therapy: Secondary | ICD-10-CM | POA: Insufficient documentation

## 2013-07-22 DIAGNOSIS — M353 Polymyalgia rheumatica: Secondary | ICD-10-CM | POA: Insufficient documentation

## 2013-07-22 DIAGNOSIS — Z7982 Long term (current) use of aspirin: Secondary | ICD-10-CM | POA: Insufficient documentation

## 2013-07-22 DIAGNOSIS — E119 Type 2 diabetes mellitus without complications: Secondary | ICD-10-CM | POA: Diagnosis not present

## 2013-07-22 DIAGNOSIS — Z8619 Personal history of other infectious and parasitic diseases: Secondary | ICD-10-CM | POA: Diagnosis not present

## 2013-07-22 DIAGNOSIS — E860 Dehydration: Secondary | ICD-10-CM | POA: Diagnosis not present

## 2013-07-22 LAB — URINALYSIS, ROUTINE W REFLEX MICROSCOPIC
BILIRUBIN URINE: NEGATIVE
GLUCOSE, UA: NEGATIVE mg/dL
HGB URINE DIPSTICK: NEGATIVE
Ketones, ur: NEGATIVE mg/dL
Leukocytes, UA: NEGATIVE
Nitrite: NEGATIVE
PROTEIN: NEGATIVE mg/dL
SPECIFIC GRAVITY, URINE: 1.017 (ref 1.005–1.030)
Urobilinogen, UA: 0.2 mg/dL (ref 0.0–1.0)
pH: 6 (ref 5.0–8.0)

## 2013-07-22 LAB — CBC WITH DIFFERENTIAL/PLATELET
BASOS ABS: 0 10*3/uL (ref 0.0–0.1)
Basophils Relative: 0 % (ref 0–1)
EOS PCT: 3 % (ref 0–5)
Eosinophils Absolute: 0.2 10*3/uL (ref 0.0–0.7)
HCT: 47.2 % (ref 39.0–52.0)
Hemoglobin: 16 g/dL (ref 13.0–17.0)
LYMPHS PCT: 5 % — AB (ref 12–46)
Lymphs Abs: 0.3 10*3/uL — ABNORMAL LOW (ref 0.7–4.0)
MCH: 27 pg (ref 26.0–34.0)
MCHC: 33.9 g/dL (ref 30.0–36.0)
MCV: 79.6 fL (ref 78.0–100.0)
Monocytes Absolute: 0.3 10*3/uL (ref 0.1–1.0)
Monocytes Relative: 5 % (ref 3–12)
NEUTROS ABS: 5.7 10*3/uL (ref 1.7–7.7)
NEUTROS PCT: 87 % — AB (ref 43–77)
PLATELETS: 176 10*3/uL (ref 150–400)
RBC: 5.93 MIL/uL — AB (ref 4.22–5.81)
RDW: 14.8 % (ref 11.5–15.5)
WBC: 6.6 10*3/uL (ref 4.0–10.5)

## 2013-07-22 LAB — COMPREHENSIVE METABOLIC PANEL
ALK PHOS: 110 U/L (ref 39–117)
ALT: 10 U/L (ref 0–53)
AST: 15 U/L (ref 0–37)
Albumin: 3.8 g/dL (ref 3.5–5.2)
BUN: 40 mg/dL — ABNORMAL HIGH (ref 6–23)
CALCIUM: 9.4 mg/dL (ref 8.4–10.5)
CHLORIDE: 102 meq/L (ref 96–112)
CO2: 21 meq/L (ref 19–32)
Creatinine, Ser: 1.19 mg/dL (ref 0.50–1.35)
GFR calc Af Amer: 60 mL/min — ABNORMAL LOW (ref >90)
GFR, EST NON AFRICAN AMERICAN: 52 mL/min — AB (ref >90)
Glucose, Bld: 243 mg/dL — ABNORMAL HIGH (ref 70–99)
POTASSIUM: 5.3 meq/L (ref 3.7–5.3)
SODIUM: 139 meq/L (ref 137–147)
Total Bilirubin: 0.6 mg/dL (ref 0.3–1.2)
Total Protein: 7.3 g/dL (ref 6.0–8.3)

## 2013-07-22 LAB — LIPASE, BLOOD: Lipase: 33 U/L (ref 11–59)

## 2013-07-22 MED ORDER — ONDANSETRON HCL 4 MG/2ML IJ SOLN
4.0000 mg | Freq: Once | INTRAMUSCULAR | Status: AC
  Administered 2013-07-22: 4 mg via INTRAVENOUS
  Filled 2013-07-22: qty 2

## 2013-07-22 MED ORDER — ONDANSETRON HCL 8 MG PO TABS: 8.0000 mg | ORAL_TABLET | Freq: Three times a day (TID) | ORAL | Status: DC | PRN

## 2013-07-22 MED ORDER — ONDANSETRON HCL 4 MG/2ML IJ SOLN: 4.0000 mg | Freq: Once | INTRAMUSCULAR | Status: DC

## 2013-07-22 MED ORDER — SODIUM CHLORIDE 0.9 % IV SOLN
INTRAVENOUS | Status: DC
  Administered 2013-07-22: 125 mL/h via INTRAVENOUS

## 2013-07-22 NOTE — ED Notes (Signed)
Pt arrived to the ED with a complaint of emesis over the last 16 hours.  Pt is a diabetic and was at a party and was eating foods he normally wouldn't eat.  Pt states he has had over 20 episodes of emesis.

## 2013-07-22 NOTE — ED Provider Notes (Signed)
CSN: [id number]     Arrival date & time 07/22/13  0624 History   First MD Initiated Contact with Patient 07/22/13 [phone]     Chief Complaint  Patient presents with  . Emesis     (Consider location/radiation/quality/duration/timing/severity/associated sxs/prior Treatment) Patient is a 80 y.o. male presenting with vomiting. The history is provided by the patient and a relative.  Emesis  He complains of numerous episodes of vomiting, and 2 episodes of diarrhea, since 11 PM last night. He denies abdominal pain, fever, chills, cough, shortness of breath, or chest pain. Symptoms started after going to a party and eating food that is atypical for him. No other recent illnesses. There are no other known modifying factors.  Past Medical History  Diagnosis Date  . Diabetes mellitus   . HTN (hypertension)   . Hyperlipemia   . Spinal stenosis   . Thalassemia   . History of Lyme disease   . PTSD (post-traumatic stress disorder)   . Cancer     left cheek  . Hemorrhoids 02/03/2005    Internal and external  . Diverticulosis of colon (without mention of hemorrhage) 02/03/2005  . Personal history of colonic polyps   . Benign neoplasm of colon   . Polymyalgia rheumatica   . Varicose veins   . Aortic stenosis    Past Surgical History  Procedure Laterality Date  . Cataract extraction, bilateral    . Kidney stone surgery    . Hemorroidectomy     Family History  Problem Relation Age of Onset  . Diabetes Father   . Leukemia Mother   . Hypertension Mother   . Arthritis Mother   . Diabetes Sister    History  Substance Use Topics  . Smoking status: Current Every Day Smoker -- 78 years    Types: Cigars  . Smokeless tobacco: Never Used     Comment: pt states that he does not inhale  . Alcohol Use: No    Review of Systems  Gastrointestinal: Positive for vomiting.  All other systems reviewed and are negative.     Allergies  Lamisil af defense  Home Medications   Prior to Admission  medications   Medication Sig Start Date End Date Taking? Authorizing Provider  amLODipine (NORVASC) 5 MG tablet Take 1 tablet (5 mg total) by mouth daily. 04/15/13  Yes Lelon Perla, MD  aspirin EC 81 MG tablet Take 81 mg by mouth daily.   Yes Historical Provider, MD  buPROPion (WELLBUTRIN) 100 MG tablet Take 100 mg by mouth every evening.    Yes Historical Provider, MD  docusate sodium (COLACE) 100 MG capsule Take 200 mg by mouth daily.   Yes Historical Provider, MD  fish oil-omega-3 fatty acids 1000 MG capsule Take 1 g by mouth daily.     Yes Historical Provider, MD  glipiZIDE (GLUCOTROL) 5 MG tablet Take 5 mg by mouth daily before breakfast.   Yes Historical Provider, MD  metFORMIN (GLUCOPHAGE) 1000 MG tablet Take 500 mg by mouth 4 (four) times daily - after meals and at bedtime.    Yes Historical Provider, MD  multivitamin-lutein (OCUVITE-LUTEIN) CAPS capsule Take 1 capsule by mouth daily.   Yes Historical Provider, MD  polyethylene glycol (MIRALAX / GLYCOLAX) packet Take 17 g by mouth daily as needed.    Yes Historical Provider, MD  Psyllium (METAMUCIL PO) Take 5 mLs by mouth every other day. ONLY TAKING ONCE OR TWICE A MONTH   Yes Historical Provider, MD  Pyridoxine HCl (  VITAMIN B6 PO) Take 1 tablet by mouth as needed.    Yes Historical Provider, MD  RABEprazole (ACIPHEX) 20 MG tablet Take 20 mg by mouth daily.     Yes Historical Provider, MD   BP 124/49  Pulse 83  Temp(Src) 98 F (36.7 C) (Oral)  Resp 20  SpO2 96% Physical Exam  Nursing note and vitals reviewed. Constitutional: He is oriented to person, place, and time. He appears well-developed.  Healthy-appearing elderly man.  HENT:  Head: Normocephalic and atraumatic.  Right Ear: External ear normal.  Left Ear: External ear normal.  Mucous membranes are mildly dry.  Eyes: Conjunctivae and EOM are normal. Pupils are equal, round, and reactive to light.  Neck: Normal range of motion and phonation normal. Neck supple.   Cardiovascular: Normal rate, regular rhythm, normal heart sounds and intact distal pulses.   Pulmonary/Chest: Effort normal and breath sounds normal. He exhibits no bony tenderness.  Abdominal: Soft. He exhibits no distension and no mass. There is no tenderness.  Musculoskeletal: Normal range of motion.  Neurological: He is alert and oriented to person, place, and time. No cranial nerve deficit or sensory deficit. He exhibits normal muscle tone. Coordination normal.  Skin: Skin is warm, dry and intact.  Psychiatric: He has a normal mood and affect. His behavior is normal. Judgment and thought content normal.    ED Course  Procedures (including critical care time)  Medications  0.9 %  sodium chloride infusion (125 mL/hr Intravenous New Bag/Given 07/22/13 0741)  ondansetron (ZOFRAN) injection 4 mg (4 mg Intravenous Given 07/22/13 0740)    Patient Vitals for the past 24 hrs:  BP Temp Temp src Pulse Resp SpO2  07/22/13 0853 124/49 mmHg - - 83 20 96 %  07/22/13 0629 165/80 mmHg 98 F (36.7 C) Oral 100 20 99 %    10:35 AM Reevaluation with update and discussion. After initial assessment and treatment, an updated evaluation reveals he is tolerating oral liquids, without vomiting. Findings discussed with patient and family member, all questions answered. [place] Review Labs Reviewed  CBC WITH DIFFERENTIAL - Abnormal; Notable for the following:    RBC 5.93 (*)    Neutrophils Relative % 87 (*)    Lymphocytes Relative 5 (*)    Lymphs Abs 0.3 (*)    All other components within normal limits  COMPREHENSIVE METABOLIC PANEL - Abnormal; Notable for the following:    Glucose, Bld 243 (*)    BUN 40 (*)    GFR calc non Af Amer 52 (*)    GFR calc Af Amer 60 (*)    All other components within normal limits  URINE CULTURE  LIPASE, BLOOD  URINALYSIS, ROUTINE W REFLEX MICROSCOPIC      MDM   Final diagnoses:  Vomiting    Nonspecific, vomiting, improved after treatment.  Differential diagnosis includes food poisoning, food intolerance, and early enteritis. Doubt metabolic instability, serious bacterial infection or impending vascular collapse.   Nursing Notes Reviewed/ Care Coordinated Applicable Imaging Reviewed Interpretation of Laboratory Data incorporated into ED treatment  The patient appears reasonably screened and/or stabilized for discharge and I doubt any other medical condition or other [facility] requiring further screening, evaluation, or treatment in the ED at this time prior to discharge.  Plan: Home Medications- Zofran; Home Treatments- gradually advance diet; return here if the recommended treatment, does not improve the symptoms; Recommended follow up- PCP, when necessary     Richarda Blade, MD 07/22/13  [phone] 

## 2013-07-22 NOTE — Discharge Instructions (Signed)
Start with a clear liquid diet today. Gradually advance your diet, using, bland foods first, over a period of 2-3 days.   Nausea and Vomiting Nausea is a sick feeling that often comes before throwing up (vomiting). Vomiting is a reflex where stomach contents come out of your mouth. Vomiting can cause severe loss of body fluids (dehydration). Children and elderly adults can become dehydrated quickly, especially if they also have diarrhea. Nausea and vomiting are symptoms of a condition or disease. It is important to find the cause of your symptoms. CAUSES   Direct irritation of the stomach lining. This irritation can result from increased acid production (gastroesophageal reflux disease), infection, food poisoning, taking certain medicines (such as nonsteroidal anti-inflammatory drugs), alcohol use, or tobacco use.  Signals from the brain.These signals could be caused by a headache, heat exposure, an inner ear disturbance, increased pressure in the brain from injury, infection, a tumor, or a concussion, pain, emotional stimulus, or metabolic problems.  An obstruction in the gastrointestinal tract (bowel obstruction).  Illnesses such as diabetes, hepatitis, gallbladder problems, appendicitis, kidney problems, cancer, sepsis, atypical symptoms of a heart attack, or eating disorders.  Medical treatments such as chemotherapy and radiation.  Receiving medicine that makes you sleep (general anesthetic) during surgery. DIAGNOSIS Your caregiver may ask for tests to be done if the problems do not improve after a few days. Tests may also be done if symptoms are severe or if the reason for the nausea and vomiting is not clear. Tests may include:  Urine tests.  Blood tests.  Stool tests.  Cultures (to look for evidence of infection).  X-rays or other imaging studies. Test results can help your caregiver make decisions about treatment or the need for additional tests. TREATMENT You need to stay  well hydrated. Drink frequently but in small amounts.You may wish to drink water, sports drinks, clear broth, or eat frozen ice pops or gelatin dessert to help stay hydrated.When you eat, eating slowly may help prevent nausea.There are also some antinausea medicines that may help prevent nausea. HOME CARE INSTRUCTIONS   Take all medicine as directed by your caregiver.  If you do not have an appetite, do not force yourself to eat. However, you must continue to drink fluids.  If you have an appetite, eat a normal diet unless your caregiver tells you differently.  Eat a variety of complex carbohydrates (rice, wheat, potatoes, bread), lean meats, yogurt, fruits, and vegetables.  Avoid high-fat foods because they are more difficult to digest.  Drink enough water and fluids to keep your urine clear or pale yellow.  If you are dehydrated, ask your caregiver for specific rehydration instructions. Signs of dehydration may include:  Severe thirst.  Dry lips and mouth.  Dizziness.  Dark urine.  Decreasing urine frequency and amount.  Confusion.  Rapid breathing or pulse. SEEK IMMEDIATE MEDICAL CARE IF:   You have blood or brown flecks (like coffee grounds) in your vomit.  You have black or bloody stools.  You have a severe headache or stiff neck.  You are confused.  You have severe abdominal pain.  You have chest pain or trouble breathing.  You do not urinate at least once every 8 hours.  You develop cold or clammy skin.  You continue to vomit for longer than 24 to 48 hours.  You have a fever. MAKE SURE YOU:   Understand these instructions.  Will watch your condition.  Will get help right away if you are not doing well  or get worse. Document Released: 03/07/2005 Document Revised: 05/30/2011 Document Reviewed: 08/04/2010 [facility] Patient Information 2014 [place], [place].

## 2013-07-23 LAB — URINE CULTURE

## 2013-07-29 DIAGNOSIS — E1159 Type 2 diabetes mellitus with other circulatory complications: Secondary | ICD-10-CM | POA: Diagnosis not present

## 2013-07-29 DIAGNOSIS — I10 Essential (primary) hypertension: Secondary | ICD-10-CM | POA: Diagnosis not present

## 2013-07-29 DIAGNOSIS — Z6829 Body mass index (BMI) 29.0-29.9, adult: Secondary | ICD-10-CM | POA: Diagnosis not present

## 2013-07-29 DIAGNOSIS — I359 Nonrheumatic aortic valve disorder, unspecified: Secondary | ICD-10-CM | POA: Diagnosis not present

## 2013-08-19 ENCOUNTER — Ambulatory Visit (HOSPITAL_COMMUNITY): Payer: Medicare Other | Attending: Cardiovascular Disease | Admitting: Cardiology

## 2013-08-19 DIAGNOSIS — I10 Essential (primary) hypertension: Secondary | ICD-10-CM | POA: Diagnosis not present

## 2013-08-19 DIAGNOSIS — I359 Nonrheumatic aortic valve disorder, unspecified: Secondary | ICD-10-CM | POA: Diagnosis not present

## 2013-08-19 DIAGNOSIS — E785 Hyperlipidemia, unspecified: Secondary | ICD-10-CM | POA: Insufficient documentation

## 2013-08-19 DIAGNOSIS — E119 Type 2 diabetes mellitus without complications: Secondary | ICD-10-CM | POA: Insufficient documentation

## 2013-08-19 DIAGNOSIS — I350 Nonrheumatic aortic (valve) stenosis: Secondary | ICD-10-CM

## 2013-08-19 NOTE — Progress Notes (Signed)
Echo performed. 

## 2013-08-22 ENCOUNTER — Encounter: Payer: Self-pay | Admitting: Cardiology

## 2013-08-22 ENCOUNTER — Ambulatory Visit (INDEPENDENT_AMBULATORY_CARE_PROVIDER_SITE_OTHER): Payer: Medicare Other | Admitting: Cardiology

## 2013-08-22 VITALS — BP 162/82 | HR 60 | Ht 66.5 in | Wt 164.8 lb

## 2013-08-22 DIAGNOSIS — E785 Hyperlipidemia, unspecified: Secondary | ICD-10-CM

## 2013-08-22 DIAGNOSIS — I10 Essential (primary) hypertension: Secondary | ICD-10-CM

## 2013-08-22 DIAGNOSIS — I350 Nonrheumatic aortic (valve) stenosis: Secondary | ICD-10-CM

## 2013-08-22 DIAGNOSIS — I359 Nonrheumatic aortic valve disorder, unspecified: Secondary | ICD-10-CM | POA: Diagnosis not present

## 2013-08-22 NOTE — Patient Instructions (Signed)
Your physician wants you to follow-up in: 6 MONTHS WITH DR CRENSHAW You will receive a reminder letter in the mail two months in advance. If you don't receive a letter, please call our office to schedule the follow-up appointment.  

## 2013-08-22 NOTE — Assessment & Plan Note (Signed)
Continue present medications. Patient follows his blood pressure at home and it is typically controlled by his report.

## 2013-08-22 NOTE — Assessment & Plan Note (Signed)
Management per primary care. 

## 2013-08-22 NOTE — Progress Notes (Signed)
HPI: fu hypertension and AS. Myoview in 2008 showed EF 59 and normal perfusion. Echocardiogram in December of 2014 revealed an ejection fraction of 35-40%, mild left ventricular hypertrophy, grade 1 diastolic dysfunction, severe aortic stenosis calculated valve area but mean gradient 19 mm of mercury, mildly dilated aortic root and mild left atrial enlargement. Dobutamine echocardiogram in February 2014 was felt consistent with severe aortic stenosis; low normal LV function. Echocardiogram repeated in June of 2015. Ejection fraction 50-55% with mild to moderate aortic stenosis and a mean gradient of 20 mm of mercury; mild biatrial enlargement. Patient last seen in Dec 2014. Since then, he denies dyspnea, chest pain, palpitations or syncope.   Current Outpatient Prescriptions  Medication Sig Dispense Refill  . amLODipine (NORVASC) 5 MG tablet Take 1 tablet (5 mg total) by mouth daily.  180 tablet  3  . aspirin EC 81 MG tablet Take 81 mg by mouth daily.      Marland Kitchen atenolol (TENORMIN) 25 MG tablet Take by mouth daily as needed.      . benazepril (LOTENSIN) 40 MG tablet Take 40 mg by mouth daily.      Marland Kitchen buPROPion (WELLBUTRIN) 100 MG tablet Take 100 mg by mouth every evening.       . docusate sodium (COLACE) 100 MG capsule Take 200 mg by mouth daily.      . fish oil-omega-3 fatty acids 1000 MG capsule Take 1 g by mouth daily.        Marland Kitchen glipiZIDE (GLUCOTROL) 5 MG tablet Take 5 mg by mouth daily before breakfast.      . metFORMIN (GLUCOPHAGE) 1000 MG tablet Take 500 mg by mouth 4 (four) times daily - after meals and at bedtime.       . multivitamin-lutein (OCUVITE-LUTEIN) CAPS capsule Take 1 capsule by mouth daily.      . ondansetron (ZOFRAN) 8 MG tablet Take 1 tablet (8 mg total) by mouth every 8 (eight) hours as needed for nausea or vomiting.  20 tablet  0  . polyethylene glycol (MIRALAX / GLYCOLAX) packet Take 17 g by mouth daily as needed.       . Psyllium (METAMUCIL PO) Take 5 mLs by mouth every  other day. ONLY TAKING ONCE OR TWICE A MONTH      . Pyridoxine HCl (VITAMIN B6 PO) Take 1 tablet by mouth as needed.       . RABEprazole (ACIPHEX) 20 MG tablet Take 20 mg by mouth daily.         No current facility-administered medications for this visit.     Past Medical History  Diagnosis Date  . Diabetes mellitus   . HTN (hypertension)   . Hyperlipemia   . Spinal stenosis   . Thalassemia   . History of Lyme disease   . PTSD (post-traumatic stress disorder)   . Cancer     left cheek  . Hemorrhoids 02/03/2005    Internal and external  . Diverticulosis of colon (without mention of hemorrhage) 02/03/2005  . Personal history of colonic polyps   . Benign neoplasm of colon   . Polymyalgia rheumatica   . Varicose veins   . Aortic stenosis     Past Surgical History  Procedure Laterality Date  . Cataract extraction, bilateral    . Kidney stone surgery    . Hemorroidectomy      History   Social History  . Marital Status: Married    Spouse Name: N/A  Number of Children: N/A  . Years of Education: N/A   Occupational History  . Not on file.   Social History Main Topics  . Smoking status: Current Every Day Smoker -- 78 years    Types: Cigars  . Smokeless tobacco: Never Used     Comment: pt states that he does not inhale  . Alcohol Use: No  . Drug Use: No  . Sexual Activity: Not on file   Other Topics Concern  . Not on file   Social History Narrative  . No narrative on file    ROS: no fevers or chills, productive cough, hemoptysis, dysphasia, odynophagia, melena, hematochezia, dysuria, hematuria, rash, seizure activity, orthopnea, PND, pedal edema, claudication. Remaining systems are negative.  Physical Exam: Well-developed well-nourished in no acute distress.  Skin is warm and dry.  HEENT is normal.  Neck is supple.  Chest is clear to auscultation with normal expansion.  Cardiovascular exam is regular rate and rhythm. 3/6 systolic murmur left sternal  border. Abdominal exam nontender or distended. No masses palpated. Extremities show no edema. neuro grossly intact   Electrocardiogram-sinus rhythm at a rate of 60. First degree AV block. Nonspecific ST changes.

## 2013-08-22 NOTE — Assessment & Plan Note (Signed)
Followup echocardiogram now shows low normal LV function and probable moderate aortic stenosis. He is not having symptoms. Conservative measures if possible.

## 2013-09-16 ENCOUNTER — Encounter: Payer: Self-pay | Admitting: Vascular Surgery

## 2013-09-17 ENCOUNTER — Ambulatory Visit: Payer: Medicare Other | Admitting: Vascular Surgery

## 2013-09-17 ENCOUNTER — Encounter: Payer: Self-pay | Admitting: Vascular Surgery

## 2013-09-17 ENCOUNTER — Ambulatory Visit (INDEPENDENT_AMBULATORY_CARE_PROVIDER_SITE_OTHER): Payer: Medicare Other | Admitting: Vascular Surgery

## 2013-09-17 VITALS — BP 166/76 | HR 70 | Resp 18 | Ht 64.0 in | Wt 167.0 lb

## 2013-09-17 DIAGNOSIS — I87309 Chronic venous hypertension (idiopathic) without complications of unspecified lower extremity: Secondary | ICD-10-CM

## 2013-09-17 DIAGNOSIS — I87303 Chronic venous hypertension (idiopathic) without complications of bilateral lower extremity: Secondary | ICD-10-CM

## 2013-09-17 NOTE — Progress Notes (Signed)
The patient presents today for followup of his chronic venous hypertension in his left leg. This is been present for many years. He has severe venous hypertension with prior venous ulcerations but fortunately no venous ulceration for many years. He is extremely compliant with elevation and graduated compression garment usage. He is seen on a yearly basis to confirm that he is disabled secondary to this. He has no new major medical difficulties. He has been diagnosed with aortic stenosis  Past Medical History  Diagnosis Date  . Diabetes mellitus   . HTN (hypertension)   . Hyperlipemia   . Spinal stenosis   . Thalassemia   . History of Lyme disease   . PTSD (post-traumatic stress disorder)   . Cancer     left cheek  . Hemorrhoids 02/03/2005    Internal and external  . Diverticulosis of colon (without mention of hemorrhage) 02/03/2005  . Personal history of colonic polyps   . Benign neoplasm of colon   . Polymyalgia rheumatica   . Varicose veins   . Aortic stenosis     History  Substance Use Topics  . Smoking status: Current Every Day Smoker -- 78 years    Types: Cigars  . Smokeless tobacco: Never Used     Comment: pt states that he does not inhale  . Alcohol Use: No    Family History  Problem Relation Age of Onset  . Diabetes Father   . Leukemia Mother   . Hypertension Mother   . Arthritis Mother   . Diabetes Sister     Allergies  Allergen Reactions  . Lamisil Af Defense [Tolnaftate]     Severe hives    Current outpatient prescriptions:amLODipine (NORVASC) 5 MG tablet, Take 1 tablet (5 mg total) by mouth daily., Disp: 180 tablet, Rfl: 3;  aspirin EC 81 MG tablet, Take 81 mg by mouth daily., Disp: , Rfl: ;  atenolol (TENORMIN) 25 MG tablet, Take by mouth daily as needed., Disp: , Rfl: ;  benazepril (LOTENSIN) 40 MG tablet, Take 40 mg by mouth daily., Disp: , Rfl:  buPROPion (WELLBUTRIN) 100 MG tablet, Take 100 mg by mouth every evening. , Disp: , Rfl: ;  docusate sodium  (COLACE) 100 MG capsule, Take 200 mg by mouth daily., Disp: , Rfl: ;  fish oil-omega-3 fatty acids 1000 MG capsule, Take 1 g by mouth daily.  , Disp: , Rfl: ;  glipiZIDE (GLUCOTROL) 5 MG tablet, Take 5 mg by mouth daily before breakfast., Disp: , Rfl:  metFORMIN (GLUCOPHAGE) 1000 MG tablet, Take 500 mg by mouth 4 (four) times daily - after meals and at bedtime. , Disp: , Rfl: ;  multivitamin-lutein (OCUVITE-LUTEIN) CAPS capsule, Take 1 capsule by mouth daily., Disp: , Rfl: ;  polyethylene glycol (MIRALAX / GLYCOLAX) packet, Take 17 g by mouth daily as needed. , Disp: , Rfl: ;  Psyllium (METAMUCIL PO), Take 5 mLs by mouth every other day. ONLY TAKING ONCE OR TWICE A MONTH, Disp: , Rfl:  RABEprazole (ACIPHEX) 20 MG tablet, Take 20 mg by mouth daily.  , Disp: , Rfl: ;  ondansetron (ZOFRAN) 8 MG tablet, Take 1 tablet (8 mg total) by mouth every 8 (eight) hours as needed for nausea or vomiting., Disp: 20 tablet, Rfl: 0;  Pyridoxine HCl (VITAMIN B6 PO), Take 1 tablet by mouth as needed. , Disp: , Rfl:   BP 166/76  Pulse 70  Resp 18  Ht 5\' 4"  (1.626 m)  Wt 167 lb (75.751 kg)  BMI  28.65 kg/m2  Body mass index is 28.65 kg/(m^2).       Physical exam well-developed gentleman appearing younger than stated age of 32. Respirations are nonlabored Neurologically he is grossly intact His left leg is noted for 2+ dorsalis pedis pulse He has changes of chronic venous hypertension with circumferential hemosiderin deposit. There are no open ulcers. There marked varicosities in his calf  Impression and plan: Stable chronic venous hypertension. This is severe but he has managed is extremely well over the years with elevation and compression. I do feel that he is a disabled secondary to this. He will continue his current treatment and see [place] in one year for further discussion

## 2013-10-04 ENCOUNTER — Telehealth: Payer: Self-pay | Admitting: Internal Medicine

## 2013-10-04 NOTE — Telephone Encounter (Signed)
Patient reports nausea and vomiting for the last several weeks.  Has been having vomiting several times this week "at least 3".  He will come in and see Nicoletta Ba PA on 10/11/13 2:30

## 2013-10-09 DIAGNOSIS — Z6829 Body mass index (BMI) 29.0-29.9, adult: Secondary | ICD-10-CM | POA: Diagnosis not present

## 2013-10-09 DIAGNOSIS — L84 Corns and callosities: Secondary | ICD-10-CM | POA: Diagnosis not present

## 2013-10-10 ENCOUNTER — Ambulatory Visit (INDEPENDENT_AMBULATORY_CARE_PROVIDER_SITE_OTHER): Payer: Medicare Other | Admitting: Podiatrist

## 2013-10-10 VITALS — Resp 16

## 2013-10-10 DIAGNOSIS — IMO0002 Reserved for concepts with insufficient information to code with codable children: Secondary | ICD-10-CM | POA: Diagnosis not present

## 2013-10-10 DIAGNOSIS — E1140 Type 2 diabetes mellitus with diabetic neuropathy, unspecified: Secondary | ICD-10-CM

## 2013-10-10 DIAGNOSIS — E1149 Type 2 diabetes mellitus with other diabetic neurological complication: Secondary | ICD-10-CM | POA: Diagnosis not present

## 2013-10-10 DIAGNOSIS — E1142 Type 2 diabetes mellitus with diabetic polyneuropathy: Secondary | ICD-10-CM | POA: Diagnosis not present

## 2013-10-10 NOTE — Progress Notes (Signed)
Subjective:  Patient presents today with his wife for concern over a callus or blister type lesion on his left great toe.  He had an area of raised skin on the toe that became red and angry looking.  The patient was prescribed Mupirocin ointment and has since been applying it for the last 2 days.  The patient wife has noticed tremendous improvement in the toe since using the medication. The patient states he has had long term trouble with the left leg and foot due to a previous injury with a sledge hammer.  He denies any systemic signs of infection.  Relates no pain in the foot.  Objective:  Vascular status unchanged with pedal pulses palpable at 1/4 dp and pt bilateral.  Venous status changes with varicosities noted on the left leg/ foot in comparison to the right.  Diminished skin texture an turgor are also noted on the left in comparison to the right.  The left halux has an area of what appears to be the remnants of a dried blister.  There is an area of loose skin that is easly removed and underlying is intact integument.  Mild pinkish hue is present to the toe but no sign of drainage, malodor, or signs of infection are present.  No sign of infection present.   Assessment:  Resolving blister/ callus left great toe; diabetes  Plan:  Recommended continued use of the mupirocin ointment for the next 4 days and a light dressing on the toe.  He will watch carefully for any sign of skin breakdown or ulceration.  We discussed shoe changes as the shoes he currently wears are loose and floppy on his feet.  He will start wearing another pair of shoes that fit his feet more snugly and do not flop off.  He will be seen back for a routine care appointment in 4 weeks and the toe will be rechecked then.  Otherwise it should go on to heal uneventfully.

## 2013-10-11 ENCOUNTER — Ambulatory Visit (INDEPENDENT_AMBULATORY_CARE_PROVIDER_SITE_OTHER): Payer: Medicare Other | Admitting: Physician Assistant

## 2013-10-11 ENCOUNTER — Encounter: Payer: Self-pay | Admitting: Physician Assistant

## 2013-10-11 VITALS — BP 128/76 | HR 64 | Ht 65.0 in | Wt 166.2 lb

## 2013-10-11 DIAGNOSIS — K5909 Other constipation: Secondary | ICD-10-CM

## 2013-10-11 DIAGNOSIS — R112 Nausea with vomiting, unspecified: Secondary | ICD-10-CM

## 2013-10-11 DIAGNOSIS — K5900 Constipation, unspecified: Secondary | ICD-10-CM | POA: Diagnosis not present

## 2013-10-11 NOTE — Progress Notes (Signed)
Subjective:    Patient ID: Antonio Buchanan, male    DOB: 1921-10-11, 80 y.o.   MRN: [id number]  HPI  Antonio Buchanan he is a very nice 80 year old white male known to Dr. Henrene Pastor. He has history of remote colon polyps, diverticulosis and chronic constipation. He has history of colon mild to rheumatic, adult onset diabetes mellitus severe aortic stenosis and hypertension. EF is 50-55%. He has a diagnosis of severe PTSD and long-term anxiety issues related to his [employer] service. Comes in today with new problems of intermittent nausea and vomiting and also did discuss chronic constipation. His daughter is worried because he has had 3 separate episodes of nausea and vomiting over the past couple of months. 2 of those episodes were associated with large meals and both times he had brief nausea prior to vomiting and vomited up a large amount of undigested food. His daughter states that he vomited several times until the stomach was empty and and immediately felt much better. He feels fine in between these episodes has no complaints of abdominal pain heartburn indigestion dysphagia or diet aphasia. Patient says he's not sure but wonders if it may be related to anxiety because he has chronic anxiety and he has always felt his anxiety in his "stomach". As far as his chronic constipation is concerned he takes Metamucil almost daily and takes MiraLax but on close questioning he has not been taking this on a daily basis. He says he may take a 2-3 times a week. He has a bowel movement generally every 3-4 days and sometimes does not pass much stool. He admits that he does feel uncomfortable if he doesn't have a bowel movement for a couple of days. He had one in ER visit within the past month after an episode of vomiting a KUB done at that time which showed a large amount of stool in the rectum. Labs were unremarkable no other imaging was done. Prior EGD and colonoscopy had been done in 2005 in 2006 elsewhere, he had  one 8 mm polyp removed and was noted to have a diverticulosis, EGD showed minimal esophagitis.    Review of Systems  Constitutional: Negative.   HENT: Negative.   Eyes: Negative.   Respiratory: Negative.   Gastrointestinal: Positive for nausea, vomiting and constipation.  Endocrine: Negative.   Genitourinary: Negative.   Musculoskeletal: Negative.   Skin: Negative.   Allergic/Immunologic: Negative.   Neurological: Negative.   Hematological: Negative.   Psychiatric/Behavioral: The patient is nervous/anxious.    Outpatient Prescriptions Prior to Visit  Medication Sig Dispense Refill  . amLODipine (NORVASC) 5 MG tablet Take 1 tablet (5 mg total) by mouth daily.  180 tablet  3  . aspirin EC 81 MG tablet Take 81 mg by mouth daily.      Marland Kitchen atenolol (TENORMIN) 25 MG tablet Take by mouth daily as needed.      . benazepril (LOTENSIN) 40 MG tablet Take 40 mg by mouth daily.      Marland Kitchen buPROPion (WELLBUTRIN) 100 MG tablet Take 100 mg by mouth every evening.       . docusate sodium (COLACE) 100 MG capsule Take 200 mg by mouth daily.      . fish oil-omega-3 fatty acids 1000 MG capsule Take 1 g by mouth daily.        Marland Kitchen glipiZIDE (GLUCOTROL) 5 MG tablet Take 5 mg by mouth daily before breakfast.      . metFORMIN (GLUCOPHAGE) 1000 MG tablet Take 500 mg by  mouth 4 (four) times daily - after meals and at bedtime.       . multivitamin-lutein (OCUVITE-LUTEIN) CAPS capsule Take 1 capsule by mouth daily.      . ondansetron (ZOFRAN) 8 MG tablet Take 1 tablet (8 mg total) by mouth every 8 (eight) hours as needed for nausea or vomiting.  20 tablet  0  . polyethylene glycol (MIRALAX / GLYCOLAX) packet Take 17 g by mouth daily as needed.       . Psyllium (METAMUCIL PO) Take 5 mLs by mouth every other day. ONLY TAKING ONCE OR TWICE A MONTH      . Pyridoxine HCl (VITAMIN B6 PO) Take 1 tablet by mouth as needed.       . RABEprazole (ACIPHEX) 20 MG tablet Take 20 mg by mouth daily.         No facility-administered  medications prior to visit.   Allergies  Allergen Reactions  . Lamisil Af Defense [Tolnaftate]     Severe hives   Patient Active Problem List   Diagnosis Date Noted  . Chronic constipation 03/26/2012  . Aortic stenosis 03/01/2012  . Chronic venous hypertension without complications 54/65/6812  . Murmur 02/24/2011  . Tobacco abuse 02/24/2011  . Diabetes mellitus   . HTN (hypertension)   . Hyperlipemia   . Spinal stenosis   . Thalassemia   . History of Lyme disease   . PTSD (post-traumatic stress disorder)   . GERD 04/06/2009  . NAUSEA 04/06/2009  . FLATULENCE-GAS-BLOATING 04/06/2009  . ABDOMINAL PAIN-EPIGASTRIC 04/06/2009   History  Substance Use Topics  . Smoking status: Current Every Day Smoker -- 78 years    Types: Cigars  . Smokeless tobacco: Never Used     Comment: pt states that he does not inhale  . Alcohol Use: No   family history includes Arthritis in his mother; Diabetes in his father and sister; Hypertension in his mother; Leukemia in his mother.     Objective:   Physical Exam well-developed elderly white male in no acute distress, accompanied by his daughter blood pressure 128/76 pulse 64 height 5 foot 5 weight 166. HEENT; nontraumatic normocephalic EOMI PERRLA sclera anicteric, Supple; no JVD, Cardiovascular; regular rate and rhythm with S1-S2 does have a systolic murmur, Pulm;clear bilaterally, Abdomen; soft nondistended nontender bowel sounds are active there is no palpable mass or hepatosplenomegaly bowel sounds are present, Rectal; exam not done, Extremities ;no clubbing cyanosis or edema skin warm and dry, Psych ;mood and affect appropriate, he is talkative and anxious        Assessment & Plan:  #4  80 year old male with chronic constipation, not well controlled on Metamucil and MiraLax #2 recent intermittent nausea and vomiting of undigested food. Episodes are sporadic inpatient feels fine in between Rule out biliary colic, rule out gastroparesis, #3  GERD #4 history of thalassemia #5 diabetes mellitus #6 aortic stenosis #7 diverticulosis #8 diabetes mellitus  Plan; schedule for upper abdominal  ultrasound Schedule for gastric emptying scan Continue AcipHex 20 mg by mouth daily Continue probiotic daily Stop Metamucil Start Amitiza 8 micrograms twice daily, patient was given samples of these are effective we will send a prescription if not may try higher dose Amitiza Brief discussion regarding a gastroparesis diet and he was given a copy of the stage III gastroparesis diet and advised to eat smaller nonfat meals Will followup in the office in 2-3 weeks.

## 2013-10-11 NOTE — Patient Instructions (Addendum)
We have given you samples of Amitiza. Take 1 tab twice  daily. Call us late next week to let us know if this dosage is helping.  Stop the Metamucil.  We have given you a diet to follow.  We made you an appointment for the Ultrasound and Gastric Empting scan for 10-28-2013 at PM . Arrive at 8:45 am .  They will do the Ultrasound first then the Gastric Emptying Scan. Do not take the Aciphex 20 mg stomach pill for 8 hours prior to the test.  We made you an appointment to follow up with Amy on 10-31-2013 at 1:30 pm.

## 2013-10-14 ENCOUNTER — Ambulatory Visit (HOSPITAL_COMMUNITY)
Admission: RE | Admit: 2013-10-14 | Discharge: 2013-10-14 | Disposition: A | Discharge status: Home / Self Care | Payer: Medicare Other | Source: Ambulatory Visit | Attending: Vascular Surgery | Admitting: Vascular Surgery

## 2013-10-14 ENCOUNTER — Other Ambulatory Visit (HOSPITAL_COMMUNITY): Payer: Self-pay | Admitting: Internal Medicine

## 2013-10-14 DIAGNOSIS — L84 Corns and callosities: Secondary | ICD-10-CM | POA: Insufficient documentation

## 2013-10-14 NOTE — Progress Notes (Signed)
Agree with initial assessment and plans 

## 2013-10-15 ENCOUNTER — Ambulatory Visit: Payer: Medicare Other

## 2013-10-16 ENCOUNTER — Encounter (HOSPITAL_BASED_OUTPATIENT_CLINIC_OR_DEPARTMENT_OTHER): Payer: Medicare Other | Attending: General Surgery

## 2013-10-16 DIAGNOSIS — E119 Type 2 diabetes mellitus without complications: Secondary | ICD-10-CM | POA: Insufficient documentation

## 2013-10-16 DIAGNOSIS — Z79899 Other long term (current) drug therapy: Secondary | ICD-10-CM | POA: Diagnosis not present

## 2013-10-16 DIAGNOSIS — L97509 Non-pressure chronic ulcer of other part of unspecified foot with unspecified severity: Secondary | ICD-10-CM | POA: Insufficient documentation

## 2013-10-16 NOTE — Progress Notes (Signed)
[facility]  NAME:  Antonio Buchanan, MUNIZ NO.:  [id number]  MEDICAL RECORD NO.:  [id number]      DATE OF BIRTH:  03/26/1921  PHYSICIAN:  Judene Companion, M.D.      VISIT DATE:  10/16/2013                                  OFFICE VISIT   This is a delightful 80 year old man who is extremely alert in extremely good shape.  He states that he had an ulcer on his left great toe.  He is a type 2 diabetic, and by the time he got in here, the ulcer which was on the plantar aspect of his foot had healed.  This gentleman is a veteran of the second World War where he fought in [place].  He is extremely healthy.  His family has great longevity.  His mother lived to be 9 years old.  I felt his pulses, which were very good in both the dorsalis pedis and posterior tibial.  His blood pressure is 150/64, pulse 62, temperature 97.9, he weighs 164 pounds.  He states that he is not on any medicine, and I said that he could come back here any time. He does take vitamins and he is on Glucotrol and he does Tenormin 25 mg a day.  I feel that this man is extremely healthy 80 year old, very active, still works hard in his yard, and does things with plants, and also he does some carpentry, and I told him if he ever had this ulcer come back to call and we would see him right away.     Judene Companion, M.D.     PP/MEDQ  D:  10/16/2013  T:  10/16/2013  Job:  [id number]

## 2013-10-23 ENCOUNTER — Telehealth: Payer: Self-pay | Admitting: Physician Assistant

## 2013-10-23 ENCOUNTER — Ambulatory Visit (HOSPITAL_COMMUNITY): Payer: Medicare Other

## 2013-10-23 MED ORDER — LUBIPROSTONE 24 MCG PO CAPS: 24.0000 ug | ORAL_CAPSULE | Freq: Two times a day (BID) | ORAL | Status: DC

## 2013-10-23 NOTE — Telephone Encounter (Signed)
Spoke with patient's daughter and gave her Nicoletta Ba, [place] recommendations.

## 2013-10-23 NOTE — Telephone Encounter (Signed)
Ok to increase Amitiza to 24 mcg twice daily- and give him samples, stop linzess. He may continue Miralax 17 gm in 8 oz water daily for now- fleets enema is fine as needed

## 2013-10-23 NOTE — Telephone Encounter (Signed)
Spoke with patient's daughter and he is still having constipation. He is taking the Amitiza 8 mcg BID. He is also taking Miralax, MOM, and Dulcolax. He is asking to increase the Amitiza to higher dose. He is asking if he can take a Fleet's enema also. Please, advise.

## 2013-10-23 NOTE — Telephone Encounter (Signed)
Patient's daughter called back and states patient got Linzess 290 mcg from another MD. He took 2 tablets this AM. He has not taken the Amitiza since last night. His last BM was 3 days ago. Explained that he should not take Linzess and Amitiza. He will not taken anything else until we call back. Please, advise.

## 2013-10-24 DIAGNOSIS — M545 Low back pain, unspecified: Secondary | ICD-10-CM | POA: Diagnosis not present

## 2013-10-28 ENCOUNTER — Encounter (HOSPITAL_COMMUNITY)
Admission: RE | Admit: 2013-10-28 | Discharge: 2013-10-28 | Disposition: A | Discharge status: Home / Self Care | Payer: Medicare Other | Source: Ambulatory Visit | Attending: Physician Assistant | Admitting: Physician Assistant

## 2013-10-28 ENCOUNTER — Telehealth: Payer: Self-pay | Admitting: Physician Assistant

## 2013-10-28 ENCOUNTER — Ambulatory Visit (HOSPITAL_COMMUNITY)
Admission: RE | Admit: 2013-10-28 | Discharge: 2013-10-28 | Disposition: A | Discharge status: Home / Self Care | Payer: Medicare Other | Source: Ambulatory Visit | Attending: Physician Assistant | Admitting: Physician Assistant

## 2013-10-28 DIAGNOSIS — R112 Nausea with vomiting, unspecified: Secondary | ICD-10-CM

## 2013-10-28 MED ORDER — TECHNETIUM TC 99M SULFUR COLLOID FILTERED
2.2000 | Freq: Once | INTRAVENOUS | Status: AC | PRN
  Administered 2013-10-28: 2.2 via INTRADERMAL

## 2013-10-28 NOTE — Telephone Encounter (Signed)
Spoke to patient. Clarified his appointment. Confirmed his cysts seen on imaging.

## 2013-10-31 ENCOUNTER — Ambulatory Visit (INDEPENDENT_AMBULATORY_CARE_PROVIDER_SITE_OTHER): Payer: Medicare Other | Admitting: Physician Assistant

## 2013-10-31 ENCOUNTER — Encounter: Payer: Self-pay | Admitting: Physician Assistant

## 2013-10-31 VITALS — BP 160/78 | HR 68 | Ht 64.5 in | Wt 160.5 lb

## 2013-10-31 DIAGNOSIS — M2550 Pain in unspecified joint: Secondary | ICD-10-CM | POA: Diagnosis not present

## 2013-10-31 DIAGNOSIS — I10 Essential (primary) hypertension: Secondary | ICD-10-CM | POA: Diagnosis not present

## 2013-10-31 DIAGNOSIS — F5089 Other specified eating disorder: Secondary | ICD-10-CM

## 2013-10-31 DIAGNOSIS — K5900 Constipation, unspecified: Secondary | ICD-10-CM

## 2013-10-31 DIAGNOSIS — I359 Nonrheumatic aortic valve disorder, unspecified: Secondary | ICD-10-CM | POA: Diagnosis not present

## 2013-10-31 DIAGNOSIS — E1159 Type 2 diabetes mellitus with other circulatory complications: Secondary | ICD-10-CM | POA: Diagnosis not present

## 2013-10-31 DIAGNOSIS — R32 Unspecified urinary incontinence: Secondary | ICD-10-CM | POA: Diagnosis not present

## 2013-10-31 DIAGNOSIS — K5909 Other constipation: Secondary | ICD-10-CM

## 2013-10-31 DIAGNOSIS — M5030 Other cervical disc degeneration, unspecified cervical region: Secondary | ICD-10-CM | POA: Diagnosis not present

## 2013-10-31 DIAGNOSIS — F4310 Post-traumatic stress disorder, unspecified: Secondary | ICD-10-CM | POA: Diagnosis not present

## 2013-10-31 DIAGNOSIS — E785 Hyperlipidemia, unspecified: Secondary | ICD-10-CM | POA: Diagnosis not present

## 2013-10-31 MED ORDER — LINACLOTIDE 290 MCG PO CAPS: 290.0000 ug | ORAL_CAPSULE | Freq: Every day | ORAL | Status: DC

## 2013-10-31 NOTE — Progress Notes (Signed)
Subjective:    Patient ID: Antonio Buchanan, male    DOB: [birth year], 80 y.o.   MRN: [id number]  HPI Antonio Buchanan is a very nice 80 year old white male, known to Dr. Earnie Larsson with history of chronic constipation, diverticulosis and colon polyps. He also has severe aortic stenosis, hypertension and adult onset diabetes mellitus. 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 He relates most of his GI symptoms to his war service which bring up terrible memories of being on the battle field. He was last seen on July 24 at that time was having a lot of problems with nausea and intermittent vomiting. We have since obtained an upper about ultrasound which was negative with the exception of a benign-appearing hepatic cyst in the left lobe of the liver measuring 3.9 x 4.1 cm and also did a gastric emptying scan which was normal. Patient continues to state that whenever he gets constipated he gets anxious- this brings on his abdominal discomfort which causes problems with panic and he will then vomit. Most recently he has been using, Amitiza, but says that the low-dose was not working well so we increased to 24 mcg twice daily. He also appears to be taking some MiraLax intermittently. He comes into today with his daughter and they state that he is still not having regular bowel movements. On further questioning he does not appear that he is taking he Amitiza on a  regular basis but rather when necessary and last had a bowel movement yesterday. He continues to view most prescription medications as "poison "and is very hesitant to stay on anything on a regular basis. He does admit that if he takes the laxatives regularly he will have more regular bowel movements. He also feels that [employer]. had worked better in the past than the [employer].    Review of Systems  Constitutional: Negative.   HENT: Negative.   Eyes:  Negative.   Respiratory: Negative.   Cardiovascular: Negative.   Gastrointestinal: Positive for nausea and constipation.  Endocrine: Negative.   Genitourinary: Negative.   Musculoskeletal: Negative.   Allergic/Immunologic: Negative.   Neurological: Negative.   Hematological: Negative.   Psychiatric/Behavioral: The patient is nervous/anxious.    Outpatient Prescriptions Prior to Visit  Medication Sig Dispense Refill  . aspirin EC 81 MG tablet Take 81 mg by mouth daily.      Marland Kitchen atenolol (TENORMIN) 25 MG tablet Take by mouth daily as needed.      . benazepril (LOTENSIN) 40 MG tablet Take 40 mg by mouth daily.      Marland Kitchen buPROPion (WELLBUTRIN) 100 MG tablet Take 100 mg by mouth every evening.       . docusate sodium (COLACE) 100 MG capsule Take 100 mg by mouth daily.       . fish oil-omega-3 fatty acids 1000 MG capsule Take 2 g by mouth daily.       Marland Kitchen glipiZIDE (GLUCOTROL) 5 MG tablet Take 5 mg by mouth daily before breakfast.      . lubiprostone (AMITIZA) 24 MCG capsule Take 1 capsule (24 mcg total) by mouth 2 (two) times daily with a meal.  60 capsule  1  . metFORMIN (GLUCOPHAGE) 1000 MG tablet Take 500 mg by mouth 4 (four) times daily - after meals and at bedtime.       . multivitamin-lutein (OCUVITE-LUTEIN) CAPS capsule Take 1 capsule by mouth daily.      Marland Kitchen  ondansetron (ZOFRAN) 8 MG tablet Take 1 tablet (8 mg total) by mouth every 8 (eight) hours as needed for nausea or vomiting.  20 tablet  0  . polyethylene glycol (MIRALAX / GLYCOLAX) packet Take 17 g by mouth daily as needed.       . Probiotic Product (PROBIOTIC DAILY PO) Take by mouth.      . RABEprazole (ACIPHEX) 20 MG tablet Take 20 mg by mouth daily.        Marland Kitchen amLODipine (NORVASC) 5 MG tablet Take 1 tablet (5 mg total) by mouth daily.  180 tablet  3  . Psyllium (METAMUCIL PO) Take 5 mLs by mouth every other day. ONLY TAKING ONCE OR TWICE A MONTH      . Pyridoxine HCl (VITAMIN B6 PO) Take 1 tablet by mouth as needed.        No  facility-administered medications prior to visit.   Allergies  Allergen Reactions  . Lamisil Af Defense [Tolnaftate]     Severe hives   Patient Active Problem List   Diagnosis Date Noted  . Chronic constipation 03/26/2012  . Aortic stenosis 03/01/2012  . Chronic venous hypertension without complications 84/66/5993  . Murmur 02/24/2011  . Tobacco abuse 02/24/2011  . Diabetes mellitus   . HTN (hypertension)   . Hyperlipemia   . Spinal stenosis   . Thalassemia   . History of Lyme disease   . PTSD (post-traumatic stress disorder)   . GERD 04/06/2009  . NAUSEA 04/06/2009  . FLATULENCE-GAS-BLOATING 04/06/2009  . ABDOMINAL PAIN-EPIGASTRIC 04/06/2009   family history includes Arthritis in his mother; Diabetes in his father and sister; Hypertension in his mother; Leukemia in his mother. History  Substance Use Topics  . Smoking status: Current Every Day Smoker -- 78 years    Types: Cigars  . Smokeless tobacco: Never Used     Comment: pt states that he does not inhale  . Alcohol Use: No       Objective:   Physical Exam  well-developed elderly white male in no acute distress, talkative blood pressure 160/78 pulse 68 height 5 foot 4 weight 160. HEENT; nontraumatic normocephalic EOMI PERRLA sclera anicteric, Supple; no JVD, Cardiovascular ;regular rate and rhythm with S1-S2 no murmur or gallop, Pulmonary; clear bilaterally, Abdomen ;soft nontender nondistended bowel sounds are active no palpable mass or hepatosplenomegaly, Rectal ;exam not done Extremitie;s no clubbing cyanosis or edema skin warm and dry, Psych ;mood and affect appropriate        Assessment & Plan:  #49  80 year old male with chronic functional constipation and what appears to be a psychogenic nausea and vomiting related to episodes of constipation. This is in a patient with severe PTSD #2 diabetes mellitus #3 hypertension #4 history of thalassemia #5 aortic stenosis  Plan; long discussion with patient and  daughter, we have settled on another trial of Linzess 290 mcg once daily, he was provided with samples today of 145 mcg tablets he will take 2 each morning as a trial- he was also given a prescription for Linzess- 290 mcg daily which she will take to the [place]. He will continue high-fiber diet liberal fluids and his daughter we'll start him on a probiotic He will followup on a when necessary basis

## 2013-10-31 NOTE — Patient Instructions (Signed)
Take Linzess 145 mcg samples 2 tablets by mouth daily in the morning until samples are finished. If this medication works well for you then take the prescription for Linzess 290 mcg and have it filled at [facility]

## 2013-11-05 ENCOUNTER — Ambulatory Visit: Payer: Medicare Other

## 2013-11-05 NOTE — Progress Notes (Signed)
Agree with above assessment and plan. Could he benefit from psychiatric evaluation ?

## 2014-01-07 ENCOUNTER — Ambulatory Visit (INDEPENDENT_AMBULATORY_CARE_PROVIDER_SITE_OTHER): Payer: Medicare Other

## 2014-01-07 DIAGNOSIS — E1140 Type 2 diabetes mellitus with diabetic neuropathy, unspecified: Secondary | ICD-10-CM | POA: Diagnosis not present

## 2014-01-07 DIAGNOSIS — B351 Tinea unguium: Secondary | ICD-10-CM

## 2014-01-07 DIAGNOSIS — M79673 Pain in unspecified foot: Secondary | ICD-10-CM

## 2014-01-07 NOTE — Progress Notes (Signed)
   Subjective:    Patient ID: Antonio Buchanan, male    DOB: [birth year], 80 y.o.   MRN: [id number]  HPI  Pt presents for nail debridement  Review of Systems no new findings or systemic changes noted     Objective:   Physical Exam Neurovascular status is unchanged pedal pulses DP and PT one over 4 PT possibly nonpalpable mild varicosities noted with some venous insufficiency and pigmentation changes of both lower extremity is has a low rash or petechiae of the medial nail fold of the left hallux no active discharge drainage no bleeding however is dressed with Silvadene and a Band-Aid dressing at this time nails thick brittle crumbly dystrophic criptotic decreased sensation Semmes Weinstein to forefoot digits consistent with diabetic neuropathy absent hair growth no open wounds or ulcerations are noted at this time.       Assessment & Plan:  Assessment diabetes history of complications peripheral neuropathy dystrophic friable gratified thick nails 1 through 5 bilateral debrided and presence of diabetes cocking his as well as onychomycosis and symptomology of nails return for future palliative care and as-needed basis contact me change difficulties and trim. Silvadene and Band-Aid applied to the medial nail fold of the right hallux there is appears to be rash or particular rash no ulcers noted will monitor this area for any changes suggested keeping antibiotic ointment and a Band-Aid on the area. Recheck in 3 months for palliative care next progress Harriet Masson DPM

## 2014-01-14 ENCOUNTER — Ambulatory Visit: Payer: Medicare Other

## 2014-01-27 DIAGNOSIS — I350 Nonrheumatic aortic (valve) stenosis: Secondary | ICD-10-CM | POA: Diagnosis not present

## 2014-01-27 DIAGNOSIS — Z6828 Body mass index (BMI) 28.0-28.9, adult: Secondary | ICD-10-CM | POA: Diagnosis not present

## 2014-01-27 DIAGNOSIS — I739 Peripheral vascular disease, unspecified: Secondary | ICD-10-CM | POA: Diagnosis not present

## 2014-01-27 DIAGNOSIS — I10 Essential (primary) hypertension: Secondary | ICD-10-CM | POA: Diagnosis not present

## 2014-01-27 DIAGNOSIS — E1151 Type 2 diabetes mellitus with diabetic peripheral angiopathy without gangrene: Secondary | ICD-10-CM | POA: Diagnosis not present

## 2014-01-27 DIAGNOSIS — M4806 Spinal stenosis, lumbar region: Secondary | ICD-10-CM | POA: Diagnosis not present

## 2014-02-17 NOTE — Progress Notes (Signed)
HPI: fu hypertension and AS. Myoview in 2008 showed EF 59 and normal perfusion. Echocardiogram in December of 2014 revealed an ejection fraction of 35-40%, mild left ventricular hypertrophy, grade 1 diastolic dysfunction, severe aortic stenosis calculated valve area but mean gradient 19 mm of mercury, mildly dilated aortic root and mild left atrial enlargement. Dobutamine echocardiogram in February 2015 was felt consistent with severe aortic stenosis; low normal LV function. Echocardiogram repeated in June of 2015. Ejection fraction 50-55% with mild to moderate aortic stenosis and a mean gradient of 20 mm of mercury; mild biatrial enlargement. Since last seen, He denies dyspnea, chest pain, palpitations or syncope.  Current Outpatient Prescriptions  Medication Sig Dispense Refill  . amLODipine (NORVASC) 5 MG tablet Take 5 mg by mouth as needed.    Marland Kitchen aspirin EC 81 MG tablet Take 81 mg by mouth daily.    Marland Kitchen atenolol (TENORMIN) 25 MG tablet Take by mouth daily as needed.    . benazepril (LOTENSIN) 40 MG tablet Take 40 mg by mouth daily.    Marland Kitchen buPROPion (WELLBUTRIN) 100 MG tablet Take 100 mg by mouth every evening.     . docusate sodium (COLACE) 100 MG capsule Take 100 mg by mouth daily.     . fish oil-omega-3 fatty acids 1000 MG capsule Take 2 g by mouth daily.     Marland Kitchen glipiZIDE (GLUCOTROL) 5 MG tablet Take 5 mg by mouth daily before breakfast.    . Linaclotide (LINZESS) 290 MCG CAPS capsule Take 1 capsule (290 mcg total) by mouth daily. (Patient taking differently: Take 290 mcg by mouth daily as needed. ) 30 capsule 5  . meloxicam (MOBIC) 15 MG tablet Take 15 mg by mouth as needed for pain.    . metFORMIN (GLUCOPHAGE) 1000 MG tablet Take 500 mg by mouth 4 (four) times daily - after meals and at bedtime.     . multivitamin-lutein (OCUVITE-LUTEIN) CAPS capsule Take 1 capsule by mouth daily.    . polyethylene glycol (MIRALAX / GLYCOLAX) packet Take 17 g by mouth daily as needed.     . Probiotic  Product (PROBIOTIC DAILY PO) Take by mouth.     No current facility-administered medications for this visit.     Past Medical History  Diagnosis Date  . Diabetes mellitus   . HTN (hypertension)   . Hyperlipemia   . Spinal stenosis   . Thalassemia   . History of Lyme disease   . PTSD (post-traumatic stress disorder)   . Cancer     left cheek  . Hemorrhoids 02/03/2005    Internal and external  . Diverticulosis of colon (without mention of hemorrhage) 02/03/2005  . Personal history of colonic polyps   . Benign neoplasm of colon   . Polymyalgia rheumatica   . Varicose veins   . Aortic stenosis     Past Surgical History  Procedure Laterality Date  . Cataract extraction, bilateral    . Kidney stone surgery    . Hemorroidectomy      History   Social History  . Marital Status: Married    Spouse Name: N/A    Number of Children: 2  . Years of Education: N/A   Occupational History  . retired Nature conservation officer    Social History Main Topics  . Smoking status: Current Every Day Smoker -- 78 years    Types: Cigars  . Smokeless tobacco: Never Used     Comment: pt states that he does not inhale  .  Alcohol Use: No  . Drug Use: No  . Sexual Activity: Not on file   Other Topics Concern  . Not on file   Social History Narrative    ROS: Some leg fatigue but no fevers or chills, productive cough, hemoptysis, dysphasia, odynophagia, melena, hematochezia, dysuria, hematuria, rash, seizure activity, orthopnea, PND, pedal edema, claudication. Remaining systems are negative.  Physical Exam: Well-developed well-nourished in no acute distress.  Skin is warm and dry.  HEENT is normal.  Neck is supple.  Chest is clear to auscultation with normal expansion.  Cardiovascular exam is regular rate and rhythm. 3/6 systolic murmur left sternal border. S2 is diminished. Abdominal exam nontender or distended. No masses palpated. Extremities show no edema. neuro grossly intact  ECG Sinus rhythm  at a rate of 64. First degree AV block. Nonspecific T-wave changes.

## 2014-02-18 ENCOUNTER — Ambulatory Visit (INDEPENDENT_AMBULATORY_CARE_PROVIDER_SITE_OTHER): Payer: Medicare Other | Admitting: Cardiology

## 2014-02-18 ENCOUNTER — Encounter: Payer: Self-pay | Admitting: *Deleted

## 2014-02-18 ENCOUNTER — Encounter: Payer: Self-pay | Admitting: Cardiology

## 2014-02-18 VITALS — BP 160/80 | HR 64 | Ht 64.0 in | Wt 164.7 lb

## 2014-02-18 DIAGNOSIS — E785 Hyperlipidemia, unspecified: Secondary | ICD-10-CM

## 2014-02-18 DIAGNOSIS — I10 Essential (primary) hypertension: Secondary | ICD-10-CM

## 2014-02-18 DIAGNOSIS — I350 Nonrheumatic aortic (valve) stenosis: Secondary | ICD-10-CM | POA: Diagnosis not present

## 2014-02-18 NOTE — Assessment & Plan Note (Signed)
Management per primary care. 

## 2014-02-18 NOTE — Assessment & Plan Note (Signed)
Plan to repeat echocardiogram in June 2016. I will see him back after that. We will follow for any symptoms. If his aortic stenosis becomes symptomatic we will consider referral for TAVR.

## 2014-02-18 NOTE — Patient Instructions (Signed)
Your physician wants you to follow-up in: June AFTER ECHO WITH DR Trellis Moment will receive a reminder letter in the mail two months in advance. If you don't receive a letter, please call our office to schedule the follow-up appointment.   Your physician has requested that you have an echocardiogram. Echocardiography is a painless test that uses sound waves to create images of your heart. It provides your doctor with information about the size and shape of your heart and how well your heart's chambers and valves are working. This procedure takes approximately one hour. There are no restrictions for this procedure.SCHEDULE IN [place]

## 2014-02-18 NOTE — Assessment & Plan Note (Signed)
Continue present blood pressure medications. Patient states his blood pressure is running low at home with a systolic in the 57Q. I have asked him to bring his cuff to the office so that we may correlate it with ours and make sure it is accurate. Continue present medications for now.

## 2014-03-17 DIAGNOSIS — H31091 Other chorioretinal scars, right eye: Secondary | ICD-10-CM | POA: Diagnosis not present

## 2014-03-17 DIAGNOSIS — Z961 Presence of intraocular lens: Secondary | ICD-10-CM | POA: Diagnosis not present

## 2014-03-17 DIAGNOSIS — H3531 Nonexudative age-related macular degeneration: Secondary | ICD-10-CM | POA: Diagnosis not present

## 2014-03-17 DIAGNOSIS — D3132 Benign neoplasm of left choroid: Secondary | ICD-10-CM | POA: Diagnosis not present

## 2014-04-29 ENCOUNTER — Ambulatory Visit (INDEPENDENT_AMBULATORY_CARE_PROVIDER_SITE_OTHER): Payer: Medicare Other

## 2014-04-29 DIAGNOSIS — E1140 Type 2 diabetes mellitus with diabetic neuropathy, unspecified: Secondary | ICD-10-CM | POA: Diagnosis not present

## 2014-04-29 DIAGNOSIS — B351 Tinea unguium: Secondary | ICD-10-CM

## 2014-04-29 DIAGNOSIS — M79673 Pain in unspecified foot: Secondary | ICD-10-CM | POA: Diagnosis not present

## 2014-04-29 NOTE — Patient Instructions (Signed)
Onychomycosis/Fungal Toenails  WHAT IS IT? An infection that lies within the keratin of your nail plate that is caused by a fungus.  WHY ME? Fungal infections affect all ages, sexes, races, and creeds.  There may be many factors that predispose you to a fungal infection such as age, coexisting medical conditions such as diabetes, or an autoimmune disease; stress, medications, fatigue, genetics, etc.  Bottom line: fungus thrives in a warm, moist environment and your shoes offer such a location.  IS IT CONTAGIOUS? Theoretically, yes.  You do not want to share shoes, nail clippers or files with someone who has fungal toenails.  Walking around barefoot in the same room or sleeping in the same bed is unlikely to transfer the organism.  It is important to realize, however, that fungus can spread easily from one nail to the next on the same foot.  HOW DO WE TREAT THIS?  There are several ways to treat this condition.  Treatment may depend on many factors such as age, medications, pregnancy, liver and kidney conditions, etc.  It is best to ask your doctor which options are available to you.  1. No treatment.   Unlike many other medical concerns, you can live with this condition.  However for many people this can be a painful condition and may lead to ingrown toenails or a bacterial infection.  It is recommended that you keep the nails cut short to help reduce the amount of fungal nail. 2. Topical treatment.  These range from herbal remedies to prescription strength nail lacquers.  About 40-50% effective, topicals require twice daily application for approximately 9 to 12 months or until an entirely new nail has grown out.  The most effective topicals are medical grade medications available through physicians offices. 3. Oral antifungal medications.  With an 80-90% cure rate, the most common oral medication requires 3 to 4 months of therapy and stays in your system for a year as the new nail grows out.  Oral  antifungal medications do require blood work to make sure it is a safe drug for you.  A liver function panel will be performed prior to starting the medication and after the first month of treatment.  It is important to have the blood work performed to avoid any harmful side effects.  In general, this medication safe but blood work is required. 4. Laser Therapy.  This treatment is performed by applying a specialized laser to the affected nail plate.  This therapy is noninvasive, fast, and non-painful.  It is not covered by insurance and is therefore, out of pocket.  The results have been very good with a 80-95% cure rate.  [facility] is the only practice in the area to offer this therapy. 5. Permanent Nail Avulsion.  Removing the entire nail so that a new nail will not grow back.    Recommendation is to obtain Fungi-Nail at any drugstore. Do not need prescription. Apply Fungi-Nail to affected nails once daily for 12 months

## 2014-04-29 NOTE — Progress Notes (Signed)
   Subjective:    Patient ID: Antonio Buchanan, male    DOB: Aug 09, 1921, 81 y.o.   MRN: [id number]  HPI Pt presents for nail debridement   Review of Systems no new findings or systemic changes noted     Objective:   Physical Exam Vascular status reveals pedal pulses DP and PT plus one over 4 bilateral somewhat diminished varicosities noted mild edema noted patient was compression stockings has dry scaling skin recommend lotion daily patient only applying it 3 times a week when nursing comes out. Nails thick brittle [place] friable discolored painful tender 1 through 5 bilateral. No other open wounds no ulcers no secondary infections mild flexible digital contractures are noted. Is decreased sensation Semmes Weinstein testing to the forefoot digits and arch.       Assessment & Plan:  Assessment diabetes history peripheral neuropathy and angiopathy. Painful mycotic dystrophic nails debrided 1 through 5 bilateral. Recommend a lotion to the skin daily also recommended Fungi-Nail for the affected fungus nails apply topically daily for 12 month duration. Reappointed in 3 months for palliative nail care  Harriet Masson DPM

## 2014-05-02 ENCOUNTER — Ambulatory Visit: Payer: Medicare Other | Admitting: Family

## 2014-05-06 DIAGNOSIS — H3531 Nonexudative age-related macular degeneration: Secondary | ICD-10-CM | POA: Diagnosis not present

## 2014-05-06 DIAGNOSIS — D3132 Benign neoplasm of left choroid: Secondary | ICD-10-CM | POA: Diagnosis not present

## 2014-05-06 DIAGNOSIS — Z961 Presence of intraocular lens: Secondary | ICD-10-CM | POA: Diagnosis not present

## 2014-05-19 DIAGNOSIS — I10 Essential (primary) hypertension: Secondary | ICD-10-CM | POA: Diagnosis not present

## 2014-05-19 DIAGNOSIS — E119 Type 2 diabetes mellitus without complications: Secondary | ICD-10-CM | POA: Diagnosis not present

## 2014-05-19 DIAGNOSIS — Z6828 Body mass index (BMI) 28.0-28.9, adult: Secondary | ICD-10-CM | POA: Diagnosis not present

## 2014-05-19 DIAGNOSIS — H1030 Unspecified acute conjunctivitis, unspecified eye: Secondary | ICD-10-CM | POA: Diagnosis not present

## 2014-05-19 DIAGNOSIS — J069 Acute upper respiratory infection, unspecified: Secondary | ICD-10-CM | POA: Diagnosis not present

## 2014-05-19 DIAGNOSIS — R51 Headache: Secondary | ICD-10-CM | POA: Diagnosis not present

## 2014-05-19 DIAGNOSIS — E1151 Type 2 diabetes mellitus with diabetic peripheral angiopathy without gangrene: Secondary | ICD-10-CM | POA: Diagnosis not present

## 2014-06-02 DIAGNOSIS — I739 Peripheral vascular disease, unspecified: Secondary | ICD-10-CM | POA: Diagnosis not present

## 2014-06-02 DIAGNOSIS — E1151 Type 2 diabetes mellitus with diabetic peripheral angiopathy without gangrene: Secondary | ICD-10-CM | POA: Diagnosis not present

## 2014-06-02 DIAGNOSIS — Z6828 Body mass index (BMI) 28.0-28.9, adult: Secondary | ICD-10-CM | POA: Diagnosis not present

## 2014-06-02 DIAGNOSIS — Z1389 Encounter for screening for other disorder: Secondary | ICD-10-CM | POA: Diagnosis not present

## 2014-06-02 DIAGNOSIS — R51 Headache: Secondary | ICD-10-CM | POA: Diagnosis not present

## 2014-06-02 DIAGNOSIS — I350 Nonrheumatic aortic (valve) stenosis: Secondary | ICD-10-CM | POA: Diagnosis not present

## 2014-06-02 DIAGNOSIS — I10 Essential (primary) hypertension: Secondary | ICD-10-CM | POA: Diagnosis not present

## 2014-06-19 ENCOUNTER — Telehealth: Payer: Self-pay

## 2014-06-19 NOTE — Telephone Encounter (Signed)
Rec'd phone call from pt.  Reported he has approx. half dollar-sized area of the left ankle that has dry/ cracked skin; c/o burning and sm. amt. bleeding @ site.   Requesting his annual appt. In June be moved-up, to an earlier date.  Appt. given 07/01/14 @ 3:00 PM.  Advised to call office if symptoms worsen, prior to 4/12 appt.  Verb. understanding.

## 2014-06-24 DIAGNOSIS — I809 Phlebitis and thrombophlebitis of unspecified site: Secondary | ICD-10-CM

## 2014-06-24 HISTORY — DX: Phlebitis and thrombophlebitis of unspecified site: I80.9

## 2014-06-30 ENCOUNTER — Encounter: Payer: Self-pay | Admitting: Vascular Surgery

## 2014-07-01 ENCOUNTER — Encounter: Payer: Self-pay | Admitting: Vascular Surgery

## 2014-07-01 ENCOUNTER — Ambulatory Visit (INDEPENDENT_AMBULATORY_CARE_PROVIDER_SITE_OTHER): Payer: Medicare Other | Admitting: Vascular Surgery

## 2014-07-01 VITALS — BP 136/81 | HR 84 | Resp 16 | Ht 66.0 in | Wt 157.6 lb

## 2014-07-01 DIAGNOSIS — I872 Venous insufficiency (chronic) (peripheral): Secondary | ICD-10-CM | POA: Diagnosis not present

## 2014-07-01 NOTE — Progress Notes (Signed)
[facility] Progress Note  CC:  Follow up chronic venous hypertension Antonio Battles, MD  HPI: This is a 81 y.o. male who is here for continued follow up of his chronic venous insuffieciency, left greater than right. He has been noticing increased dryness resulting in "cracking" of his skin and bleeding to the left lower legs and heels. He has also noticed some skin color changes to his leg. He has been using a wide variety of lotions and prescription antifungal and topical steroid creams to his legs in addition to wearing compression stockings. He has been having difficulty with ambulation lately. He is seen on a yearly basis to confirm that he is disabled secondary to his venous hypertension. He denies any ulceration. He has recently developed macular degeneration.   Past Medical History  Diagnosis Date  . Diabetes mellitus   . HTN (hypertension)   . Hyperlipemia   . Spinal stenosis   . Thalassemia   . History of Lyme disease   . PTSD (post-traumatic stress disorder)   . Cancer     left cheek  . Hemorrhoids 02/03/2005    Internal and external  . Diverticulosis of colon (without mention of hemorrhage) 02/03/2005  . Personal history of colonic polyps   . Benign neoplasm of colon   . Polymyalgia rheumatica   . Varicose veins   . Aortic stenosis    Past Surgical History  Procedure Laterality Date  . Cataract extraction, bilateral    . Kidney stone surgery    . Hemorroidectomy      Allergies  Allergen Reactions  . Lamisil Af Defense [Tolnaftate]     Severe hives    Current Outpatient Prescriptions  Medication Sig Dispense Refill  . aspirin EC 81 MG tablet Take 81 mg by mouth daily.    . benazepril (LOTENSIN) 40 MG tablet Take 40 mg by mouth daily.    Marland Kitchen buPROPion (WELLBUTRIN) 100 MG tablet Take 100 mg by mouth every evening.     . docusate sodium (COLACE) 100 MG capsule Take 100 mg by mouth daily.     . fish oil-omega-3 fatty acids  1000 MG capsule Take 2 g by mouth daily.     Marland Kitchen glipiZIDE (GLUCOTROL) 5 MG tablet Take 5 mg by mouth daily before breakfast.    . Linaclotide (LINZESS) 290 MCG CAPS capsule Take 1 capsule (290 mcg total) by mouth daily. (Patient taking differently: Take 290 mcg by mouth daily as needed. ) 30 capsule 5  . metFORMIN (GLUCOPHAGE) 1000 MG tablet Take 500 mg by mouth 4 (four) times daily - after meals and at bedtime.     . multivitamin-lutein (OCUVITE-LUTEIN) CAPS capsule Take 1 capsule by mouth daily.    . polyethylene glycol (MIRALAX / GLYCOLAX) packet Take 17 g by mouth daily as needed.     Marland Kitchen amLODipine (NORVASC) 5 MG tablet Take 5 mg by mouth as needed.    Marland Kitchen atenolol (TENORMIN) 25 MG tablet Take by mouth daily as needed.    . meloxicam (MOBIC) 15 MG tablet Take 15 mg by mouth as needed for pain.    . Probiotic Product (PROBIOTIC DAILY PO) Take by mouth.     No current facility-administered medications for this visit.    Family History  Problem Relation Age of Onset  . Diabetes Father   . Leukemia Mother   . Hypertension Mother   . Arthritis Mother   . Diabetes Sister     History  Social History  . Marital Status: Married    Spouse Name: N/A  . Number of Children: 2  . Years of Education: N/A   Occupational History  . retired Nature conservation officer    Social History Main Topics  . Smoking status: Current Every Day Smoker -- 78 years    Types: Cigars  . Smokeless tobacco: Never Used     Comment: pt states that he does not inhale  . Alcohol Use: No  . Drug Use: No  . Sexual Activity: Not on file   Other Topics Concern  . Not on file   Social History Narrative     ROS: [x]  Positive   [ ]  Negative   [ ]  All sytems reviewed and are negative  Cardiovascular: []  chest pain/pressure []  palpitations [x]  SOB with exertion []  DOE []  pain in legs while walking [x]  pain in feet when lying flat []  hx of DVT [x]  hx of phlebitis [x]  swelling in legs [x]  varicose veins  Pulmonary: []   productive cough []  asthma []  wheezing  Neurologic: [x]  weakness in []  arms []  legs [x]  numbness in []  arms []  legs [] difficulty speaking or slurred speech []  temporary loss of vision in one eye []  dizziness  Hematologic: []  bleeding problems []  problems with blood clotting easily  GI []  vomiting blood []  blood in stool  GU: []  burning with urination []  blood in urine  Psychiatric: []  hx of major depression  Integumentary: []  rashes []  ulcers  Constitutional: []  fever []  chills   PHYSICAL EXAMINATION:  Filed Vitals:   07/01/14 1532  BP: 136/81  Pulse: 84  Resp: 16   Body mass index is 25.45 kg/(m^2).  General:  WDWN elderly male in NAD Gait: Not observed HENT: WNL, normocephalic Pulmonary: normal non-labored breathing Skin: hemosiderin staining to left lower leg below knee and varicosities. No ulcerations seen. Vascular Exam/Pulses: 2+ dorsalis pedis pulses bilaterally  Extremities: Edematous left lower leg, no ischemic changes, no gangrene, no cellulitis.  Musculoskeletal: no muscle wasting or atrophy  Neurologic: A&O X 3; Appropriate Affect ; SENSATION: normal; MOTOR FUNCTION:  moving all extremities equally. Speech is fluent/normal   ASSESSMENT: 81 y.o. male with chronic venous hypertension, left greater than right. He has normal arterial function. He has significant swelling of his leg leg with hemosiderin staining and varicosities. He is now having increased difficulties with ambulation secondary to the swelling and discomfort. Fortunately he has not developed any ulcerations. Encouraged continued use of compression therapy of which he is very compliant. Discussed that he should replace his compression stockings following 3 months for maximal benefit. He may use any of the over the counter lotions for moisturization but he does not need to use any topical steroids or antifungals to his legs. We will complete his annual paperwork for disability secondary to  venous hypertension. He will follow up in one year.   Virgina Jock, PA-C [facility] [phone]  Clinic MD:  Pt seen and examined in conjunction with Dr. Donnetta Hutching.    I have examined the patient, reviewed and agree with above. Long discussion with the patient and his daughter present. As above explained the nature of his chronic venous hypertension. Fortunately has normal arterial flow. Sees me yearly for filling out disability forms will be completed and sent to the patient.  EARLY, TODD, MD 07/01/2014 5:54 PM

## 2014-07-14 DIAGNOSIS — Z6827 Body mass index (BMI) 27.0-27.9, adult: Secondary | ICD-10-CM | POA: Diagnosis not present

## 2014-07-14 DIAGNOSIS — I872 Venous insufficiency (chronic) (peripheral): Secondary | ICD-10-CM | POA: Diagnosis not present

## 2014-07-14 DIAGNOSIS — I350 Nonrheumatic aortic (valve) stenosis: Secondary | ICD-10-CM | POA: Diagnosis not present

## 2014-07-15 ENCOUNTER — Telehealth: Payer: Self-pay

## 2014-07-15 NOTE — Telephone Encounter (Signed)
Pt. called to report he had redness in left lower leg that started over night a couple days ago.  Stated he had burning and itching of the lower leg from the knee and down to the foot.  Denied any weeping, drainage, or open sores of the left lower leg.  Denied fever/ chills.  Reported he went to his PCP, Dr. Sharlett Iles, and was placed on an antibiotic and also a topical cream.  Stated he will f/u with Dr. Sharlett Iles, and will call our office if no improvement in his symptoms, following the antibiotic.

## 2014-07-18 DIAGNOSIS — I872 Venous insufficiency (chronic) (peripheral): Secondary | ICD-10-CM | POA: Diagnosis not present

## 2014-07-18 DIAGNOSIS — Z6827 Body mass index (BMI) 27.0-27.9, adult: Secondary | ICD-10-CM | POA: Diagnosis not present

## 2014-07-24 DIAGNOSIS — Z6827 Body mass index (BMI) 27.0-27.9, adult: Secondary | ICD-10-CM | POA: Diagnosis not present

## 2014-07-24 DIAGNOSIS — I872 Venous insufficiency (chronic) (peripheral): Secondary | ICD-10-CM | POA: Diagnosis not present

## 2014-07-24 DIAGNOSIS — I739 Peripheral vascular disease, unspecified: Secondary | ICD-10-CM | POA: Diagnosis not present

## 2014-07-24 DIAGNOSIS — I10 Essential (primary) hypertension: Secondary | ICD-10-CM | POA: Diagnosis not present

## 2014-07-29 ENCOUNTER — Ambulatory Visit (INDEPENDENT_AMBULATORY_CARE_PROVIDER_SITE_OTHER): Payer: Medicare Other

## 2014-07-29 DIAGNOSIS — E1140 Type 2 diabetes mellitus with diabetic neuropathy, unspecified: Secondary | ICD-10-CM

## 2014-07-29 DIAGNOSIS — B351 Tinea unguium: Secondary | ICD-10-CM

## 2014-07-29 DIAGNOSIS — M79673 Pain in unspecified foot: Secondary | ICD-10-CM

## 2014-08-21 ENCOUNTER — Ambulatory Visit (HOSPITAL_COMMUNITY)
Admission: RE | Admit: 2014-08-21 | Discharge: 2014-08-21 | Disposition: A | Discharge status: Home / Self Care | Payer: Medicare Other | Source: Ambulatory Visit | Attending: Cardiovascular Disease | Admitting: Cardiovascular Disease

## 2014-08-21 ENCOUNTER — Ambulatory Visit (HOSPITAL_COMMUNITY): Payer: Medicare Other

## 2014-08-21 DIAGNOSIS — I700 Atherosclerosis of aorta: Secondary | ICD-10-CM | POA: Diagnosis not present

## 2014-08-21 DIAGNOSIS — I350 Nonrheumatic aortic (valve) stenosis: Secondary | ICD-10-CM | POA: Diagnosis not present

## 2014-08-21 DIAGNOSIS — I517 Cardiomegaly: Secondary | ICD-10-CM | POA: Diagnosis not present

## 2014-08-21 DIAGNOSIS — I38 Endocarditis, valve unspecified: Secondary | ICD-10-CM | POA: Insufficient documentation

## 2014-08-21 DIAGNOSIS — I340 Nonrheumatic mitral (valve) insufficiency: Secondary | ICD-10-CM | POA: Insufficient documentation

## 2014-08-21 DIAGNOSIS — I351 Nonrheumatic aortic (valve) insufficiency: Secondary | ICD-10-CM | POA: Insufficient documentation

## 2014-08-21 DIAGNOSIS — I358 Other nonrheumatic aortic valve disorders: Secondary | ICD-10-CM | POA: Diagnosis present

## 2014-08-22 DIAGNOSIS — K1370 Unspecified lesions of oral mucosa: Secondary | ICD-10-CM | POA: Diagnosis not present

## 2014-08-28 NOTE — Progress Notes (Signed)
HPI: FU AS. Myoview in 2008 showed EF 59 and normal perfusion. Dobutamine echocardiogram in February 2015 was felt consistent with severe aortic stenosis; low normal LV function. Echocardiogram repeated in June of 2015. Ejection fraction 50-55% with moderate aortic stenosis and a mean gradient of 20 mm of mercury; mild biatrial enlargement. FU echo 6/15 showed EF 21-30, grade 2 diastolic dysfunction, moderate to severe AS (mean gradient 21 mmHg, AVA 1 cm2, mild MR, mild LAE. Since last seen, the patient denies any dyspnea on exertion, orthopnea, PND, pedal edema, palpitations, syncope or chest pain.   Current Outpatient Prescriptions  Medication Sig Dispense Refill  . amLODipine (NORVASC) 5 MG tablet Take 5 mg by mouth as needed.    Marland Kitchen aspirin EC 81 MG tablet Take 81 mg by mouth daily. 1/4 asprin daily    . atenolol (TENORMIN) 25 MG tablet Take by mouth daily as needed.    . benazepril (LOTENSIN) 40 MG tablet Take 40 mg by mouth daily.    Marland Kitchen buPROPion (WELLBUTRIN) 100 MG tablet Take 100 mg by mouth every evening.     . docusate sodium (COLACE) 100 MG capsule Take 100 mg by mouth daily.     . fish oil-omega-3 fatty acids 1000 MG capsule Take 2 g by mouth daily.     Marland Kitchen glipiZIDE (GLUCOTROL) 5 MG tablet Take 5 mg by mouth daily before breakfast.    . Linaclotide (LINZESS) 290 MCG CAPS capsule Take 1 capsule (290 mcg total) by mouth daily. (Patient taking differently: Take 290 mcg by mouth daily as needed. ) 30 capsule 5  . metFORMIN (GLUCOPHAGE) 1000 MG tablet Take 500 mg by mouth 4 (four) times daily - after meals and at bedtime.     . multivitamin-lutein (OCUVITE-LUTEIN) CAPS capsule Take 1 capsule by mouth daily.    . polyethylene glycol (MIRALAX / GLYCOLAX) packet Take 17 g by mouth daily as needed.     . Probiotic Product (PROBIOTIC DAILY PO) Take by mouth.     No current facility-administered medications for this visit.     Past Medical History  Diagnosis Date  . Diabetes mellitus    . HTN (hypertension)   . Hyperlipemia   . Spinal stenosis   . Thalassemia   . History of Lyme disease   . PTSD (post-traumatic stress disorder)   . Cancer     left cheek  . Hemorrhoids 02/03/2005    Internal and external  . Diverticulosis of colon (without mention of hemorrhage) 02/03/2005  . Personal history of colonic polyps   . Benign neoplasm of colon   . Polymyalgia rheumatica   . Varicose veins   . Aortic stenosis     Past Surgical History  Procedure Laterality Date  . Cataract extraction, bilateral    . Kidney stone surgery    . Hemorroidectomy      History   Social History  . Marital Status: Married    Spouse Name: N/A  . Number of Children: 2  . Years of Education: N/A   Occupational History  . retired Nature conservation officer    Social History Main Topics  . Smoking status: Current Every Day Smoker -- 78 years    Types: Cigars  . Smokeless tobacco: Never Used     Comment: pt states that he does not inhale  . Alcohol Use: No  . Drug Use: No  . Sexual Activity: Not on file   Other Topics Concern  . Not on file  Social History Narrative    ROS: no fevers or chills, productive cough, hemoptysis, dysphasia, odynophagia, melena, hematochezia, dysuria, hematuria, rash, seizure activity, orthopnea, PND, pedal edema, claudication. Remaining systems are negative.  Physical Exam: Well-developed well-nourished in no acute distress.  Skin is warm and dry.  HEENT is normal.  Neck is supple.  Chest is clear to auscultation with normal expansion.  Cardiovascular exam is regular rate and rhythm. 3/6 systolic murmur left sternal border. Abdominal exam nontender or distended. No masses palpated. Extremities show trace edema. neuro grossly intact  ECG sinus rhythm at a rate of 59. First-degree AV block. Nonspecific ST changes.

## 2014-08-29 ENCOUNTER — Encounter: Payer: Self-pay | Admitting: Cardiology

## 2014-08-29 ENCOUNTER — Ambulatory Visit (INDEPENDENT_AMBULATORY_CARE_PROVIDER_SITE_OTHER): Payer: Medicare Other | Admitting: Cardiology

## 2014-08-29 VITALS — BP 162/80 | HR 59 | Ht 66.0 in | Wt 161.0 lb

## 2014-08-29 DIAGNOSIS — I10 Essential (primary) hypertension: Secondary | ICD-10-CM

## 2014-08-29 DIAGNOSIS — I350 Nonrheumatic aortic (valve) stenosis: Secondary | ICD-10-CM | POA: Diagnosis not present

## 2014-08-29 DIAGNOSIS — I420 Dilated cardiomyopathy: Secondary | ICD-10-CM | POA: Insufficient documentation

## 2014-08-29 MED ORDER — METOPROLOL SUCCINATE ER 25 MG PO TB24: 25.0000 mg | ORAL_TABLET | Freq: Every day | ORAL | Status: DC

## 2014-08-29 NOTE — Assessment & Plan Note (Signed)
LV function is now reduced. Question secondary to aortic valve disease. Plan as outlined under aortic stenosis. Given reduced ejection fraction discontinue atenolol and instead treated with Toprol 25 mg daily. Continue ACE inhibitor.

## 2014-08-29 NOTE — Assessment & Plan Note (Signed)
Long discussion with patient today. Difficult decision concerning aggressiveness for aortic stenosis intervention. We discussed conservative measures as the patient is asymptomatic. However his LV function is now reduced and his aortic stenosis may be underestimated. Visually it appears to be severe. We talked about proceeding with TAVR evaluation to include initially a transesophageal echocardiogram to better assess aortic valve morphology and R and L cardiac catheterization to rule out coronary disease and better assess aortic stenosis. He would like to consider this before making a final decision and discuss with his family. He appears to be leaning towards conservative measures.

## 2014-08-29 NOTE — Patient Instructions (Signed)
Your physician recommends that you schedule a follow-up appointment in: 4-6 WEEKS WITH DR CRENSHAW  STOP ATENOLOL  START METOPROLOL SUCC ER 25 MG ONCE DAILY AT BEDTIME

## 2014-08-29 NOTE — Assessment & Plan Note (Signed)
Continue present medications. 

## 2014-09-01 ENCOUNTER — Telehealth: Payer: Self-pay

## 2014-09-01 DIAGNOSIS — M79605 Pain in left leg: Secondary | ICD-10-CM

## 2014-09-01 DIAGNOSIS — M7989 Other specified soft tissue disorders: Secondary | ICD-10-CM

## 2014-09-01 NOTE — Telephone Encounter (Signed)
Phone call from pt's wife.  Reported increased redness, swelling and pain in the left lower leg from foot to just below knee.  Stated he noticed the increased swelling this morning and now feels that the color has become deeper red during the course of the day.  Verb. concern about his left leg being infected.  Denied any fever/ chills.  Denied any drainage from left LE.  Stated that there is usually swelling in his legs, but the swelling in the left leg has gotten much worse today.  Is questioning if he should go to the ER.  Discussed with Dr. Trula Slade.  Rec'd. v.o. for left LE venous duplex to r/o DVT, and office appt. tomorrow.   Appt. Given to pt's wife for 2:30 PM 6/14; advised if symptoms worsen tonight, he should go to the ER.  Verb. Understanding.

## 2014-09-02 ENCOUNTER — Ambulatory Visit (HOSPITAL_COMMUNITY)
Admission: RE | Admit: 2014-09-02 | Discharge: 2014-09-02 | Disposition: A | Discharge status: Home / Self Care | Payer: Medicare Other | Source: Ambulatory Visit | Attending: Vascular Surgery | Admitting: Vascular Surgery

## 2014-09-02 ENCOUNTER — Encounter: Payer: Self-pay | Admitting: Vascular Surgery

## 2014-09-02 ENCOUNTER — Ambulatory Visit (INDEPENDENT_AMBULATORY_CARE_PROVIDER_SITE_OTHER): Payer: Medicare Other | Admitting: Vascular Surgery

## 2014-09-02 VITALS — BP 140/76 | HR 62 | Ht 66.0 in | Wt 160.0 lb

## 2014-09-02 DIAGNOSIS — M79605 Pain in left leg: Secondary | ICD-10-CM | POA: Diagnosis not present

## 2014-09-02 DIAGNOSIS — M7989 Other specified soft tissue disorders: Secondary | ICD-10-CM

## 2014-09-02 DIAGNOSIS — R21 Rash and other nonspecific skin eruption: Secondary | ICD-10-CM

## 2014-09-02 NOTE — Progress Notes (Signed)
HISTORY AND PHYSICAL     CC:  Cellulitis flare up lower left leg Referring Provider:  Leanna Battles, MD  HPI: This is a 81 y.o. male who is well known to Dr. Donnetta Hutching for his chronic venous insufficiency and was last seen in April.  He presents today with new redness to his LLE to just below his knee with c/o itching.  He states that he recently started using Eucerin cream and that is when his leg started turning red.  He states the uses his lotion and then places an umbrella bag over his left leg then places his compression sock.  He also recently started using the umbrella bag.   He states that he was on antibiotics for 10 days.  He has not been able to sleep due to pain and itching.  He did take Klonopin to help him sleep.  He is also using Benadryl cream for the itching and it providing some relief.  He states he is having more swelling than normal.  He elevates his feet several times/day.  Past Medical History  Diagnosis Date  . Diabetes mellitus   . HTN (hypertension)   . Hyperlipemia   . Spinal stenosis   . Thalassemia   . History of Lyme disease   . PTSD (post-traumatic stress disorder)   . Cancer     left cheek  . Hemorrhoids 02/03/2005    Internal and external  . Diverticulosis of colon (without mention of hemorrhage) 02/03/2005  . Personal history of colonic polyps   . Benign neoplasm of colon   . Polymyalgia rheumatica   . Varicose veins   . Aortic stenosis     Past Surgical History  Procedure Laterality Date  . Cataract extraction, bilateral    . Kidney stone surgery    . Hemorroidectomy      Allergies  Allergen Reactions  . Lamisil Af Defense [Tolnaftate]     Severe hives    Current Outpatient Prescriptions  Medication Sig Dispense Refill  . amLODipine (NORVASC) 5 MG tablet Take 5 mg by mouth as needed.    Marland Kitchen aspirin EC 81 MG tablet Take 81 mg by mouth daily. 1/4 asprin daily    . benazepril (LOTENSIN) 40 MG tablet Take 40 mg by mouth daily.    Marland Kitchen  buPROPion (WELLBUTRIN) 100 MG tablet Take 100 mg by mouth every evening.     . docusate sodium (COLACE) 100 MG capsule Take 100 mg by mouth daily.     . fish oil-omega-3 fatty acids 1000 MG capsule Take 2 g by mouth daily.     Marland Kitchen glipiZIDE (GLUCOTROL) 5 MG tablet Take 5 mg by mouth daily before breakfast.    . Linaclotide (LINZESS) 290 MCG CAPS capsule Take 1 capsule (290 mcg total) by mouth daily. (Patient taking differently: Take 290 mcg by mouth daily as needed. ) 30 capsule 5  . metFORMIN (GLUCOPHAGE) 1000 MG tablet Take 500 mg by mouth 4 (four) times daily - after meals and at bedtime.     . metoprolol succinate (TOPROL XL) 25 MG 24 hr tablet Take 1 tablet (25 mg total) by mouth daily. 90 tablet 3  . multivitamin-lutein (OCUVITE-LUTEIN) CAPS capsule Take 1 capsule by mouth daily.    . polyethylene glycol (MIRALAX / GLYCOLAX) packet Take 17 g by mouth daily as needed.     . Probiotic Product (PROBIOTIC DAILY PO) Take by mouth.     No current facility-administered medications for this visit.    Family  History  Problem Relation Age of Onset  . Diabetes Father   . Leukemia Mother   . Hypertension Mother   . Arthritis Mother   . Diabetes Sister     History   Social History  . Marital Status: Married    Spouse Name: N/A  . Number of Children: 2  . Years of Education: N/A   Occupational History  . retired Nature conservation officer    Social History Main Topics  . Smoking status: Current Every Day Smoker -- 78 years    Types: Cigars  . Smokeless tobacco: Never Used     Comment: pt states that he does not inhale  . Alcohol Use: No  . Drug Use: No  . Sexual Activity: Not on file   Other Topics Concern  . Not on file   Social History Narrative     ROS: [x]  Positive   [ ]  Negative   [ ]  All sytems reviewed and are negative  Cardiovascular: []  chest pain/pressure []  palpitations []  SOB lying flat []  DOE [x]  pain in legs while walking []  pain in feet when lying flat [x]  hx of DVT []   hx of phlebitis [x]  swelling in legs [x]  varicose veins  Pulmonary: []  productive cough []  asthma []  wheezing  Neurologic: []  weakness in []  arms []  legs []  numbness in []  arms []  legs [] difficulty speaking or slurred speech []  temporary loss of vision in one eye []  dizziness  Hematologic: []  bleeding problems []  problems with blood clotting easily  GI []  vomiting blood []  blood in stool  GU: []  burning with urination []  blood in urine  Psychiatric: []  hx of major depression  Integumentary: []  rashes []  ulcers  Constitutional: []  fever []  chills   PHYSICAL EXAMINATION:  Filed Vitals:   09/02/14 1513  BP: 140/76  Pulse: 62   Body mass index is 25.84 kg/(m^2).  General:  WDWN in NAD Gait: Not observed HENT: WNL, normocephalic Pulmonary: normal non-labored breathing Skin: with erythema over the left foot and lower leg just below the knee and also with a rash type appearance over the foot and the proximal portion of the erythema, without ulcers; scaling over the lateral portion of the left leg over the lateral malleolous Vascular Exam/Pulses: + palpable left DP Extremities: without ischemic changes, without Gangrene , without cellulitis; without open wounds;  Musculoskeletal: no muscle wasting or atrophy  Neurologic: A&O X 3; Appropriate Affect ; SENSATION: normal; MOTOR FUNCTION:  moving all extremities equally. Speech is fluent/normal   Non-Invasive Vascular Imaging:   Venous duplex is negative for DVT 09/02/14  Pt meds includes: Statin:  No. Beta Blocker:  Yes.   Aspirin:  Yes.   ACEI:  Yes.   ARB:  No. Other Antiplatelet/Anticoagulant:  No.    ASSESSMENT/PLAN:: 81 y.o. male with chronic venous insufficiency with new redness and rash over the left leg from the foot to just below the knee   -the pt continues to have a palpable left DP pulse.  His venous duplex is negative for DVT today. -he recently started using Eucerin and an umbrella bad over  the left leg and then applying the compression sock.  That is when the redness started.  We have instructed him to not use the Eucerin or the umbrella bag, but to continue to moisturize his leg with regular lotion.   -okay to continue to use topical benadryl for the itching -continue compression/elevation for swelling -we will set him up to see Dr. Jarome Matin with  dermatology to evaluate his new redness/rash of left leg   Leontine Locket, PA-C [facility] [phone]  Clinic MD:  Pt seen and examined in conjunction with Dr. Donnetta Hutching  I have examined the patient, reviewed and agree with above. Discussed with patient and his wife present. No new DVT. Patient has a long history of chronic venous hypertension. Unclear as to the new skin changes. This does not appear to be consistent with chronic venous hypertension is. Has a very red and inflamed skin color changes demarcating with satellite lesions extending up towards his knee.  Curt Jews, MD 09/02/2014 4:21 PM

## 2014-09-03 ENCOUNTER — Emergency Department (HOSPITAL_COMMUNITY): Payer: Medicare Other

## 2014-09-03 ENCOUNTER — Emergency Department (HOSPITAL_COMMUNITY)
Admission: EM | Admit: 2014-09-03 | Discharge: 2014-09-03 | Disposition: A | Discharge status: Home / Self Care | Payer: Medicare Other | Attending: Emergency Medicine | Admitting: Emergency Medicine

## 2014-09-03 ENCOUNTER — Encounter (HOSPITAL_COMMUNITY): Payer: Self-pay | Admitting: *Deleted

## 2014-09-03 DIAGNOSIS — E119 Type 2 diabetes mellitus without complications: Secondary | ICD-10-CM | POA: Insufficient documentation

## 2014-09-03 DIAGNOSIS — Z79899 Other long term (current) drug therapy: Secondary | ICD-10-CM | POA: Insufficient documentation

## 2014-09-03 DIAGNOSIS — L308 Other specified dermatitis: Secondary | ICD-10-CM | POA: Diagnosis not present

## 2014-09-03 DIAGNOSIS — I10 Essential (primary) hypertension: Secondary | ICD-10-CM | POA: Diagnosis not present

## 2014-09-03 DIAGNOSIS — Z8619 Personal history of other infectious and parasitic diseases: Secondary | ICD-10-CM | POA: Insufficient documentation

## 2014-09-03 DIAGNOSIS — M79605 Pain in left leg: Secondary | ICD-10-CM | POA: Diagnosis present

## 2014-09-03 DIAGNOSIS — Z8719 Personal history of other diseases of the digestive system: Secondary | ICD-10-CM | POA: Diagnosis not present

## 2014-09-03 DIAGNOSIS — Z8739 Personal history of other diseases of the musculoskeletal system and connective tissue: Secondary | ICD-10-CM | POA: Diagnosis not present

## 2014-09-03 DIAGNOSIS — Z862 Personal history of diseases of the blood and blood-forming organs and certain disorders involving the immune mechanism: Secondary | ICD-10-CM | POA: Insufficient documentation

## 2014-09-03 DIAGNOSIS — Z86018 Personal history of other benign neoplasm: Secondary | ICD-10-CM | POA: Insufficient documentation

## 2014-09-03 DIAGNOSIS — Z7982 Long term (current) use of aspirin: Secondary | ICD-10-CM | POA: Insufficient documentation

## 2014-09-03 DIAGNOSIS — Z85828 Personal history of other malignant neoplasm of skin: Secondary | ICD-10-CM | POA: Diagnosis not present

## 2014-09-03 DIAGNOSIS — E875 Hyperkalemia: Secondary | ICD-10-CM | POA: Diagnosis not present

## 2014-09-03 DIAGNOSIS — Z720 Tobacco use: Secondary | ICD-10-CM | POA: Insufficient documentation

## 2014-09-03 DIAGNOSIS — L0100 Impetigo, unspecified: Secondary | ICD-10-CM | POA: Diagnosis not present

## 2014-09-03 DIAGNOSIS — R21 Rash and other nonspecific skin eruption: Secondary | ICD-10-CM | POA: Insufficient documentation

## 2014-09-03 DIAGNOSIS — M7989 Other specified soft tissue disorders: Secondary | ICD-10-CM | POA: Diagnosis not present

## 2014-09-03 DIAGNOSIS — Z86010 Personal history of colonic polyps: Secondary | ICD-10-CM | POA: Insufficient documentation

## 2014-09-03 DIAGNOSIS — Z8659 Personal history of other mental and behavioral disorders: Secondary | ICD-10-CM | POA: Insufficient documentation

## 2014-09-03 LAB — CBC WITH DIFFERENTIAL/PLATELET
BASOS ABS: 0 10*3/uL (ref 0.0–0.1)
Basophils Relative: 1 % (ref 0–1)
EOS PCT: 8 % — AB (ref 0–5)
Eosinophils Absolute: 0.6 10*3/uL (ref 0.0–0.7)
HEMATOCRIT: 42.7 % (ref 39.0–52.0)
Hemoglobin: 14.1 g/dL (ref 13.0–17.0)
Lymphocytes Relative: 16 % (ref 12–46)
Lymphs Abs: 1.3 10*3/uL (ref 0.7–4.0)
MCH: 26.8 pg (ref 26.0–34.0)
MCHC: 33 g/dL (ref 30.0–36.0)
MCV: 81 fL (ref 78.0–100.0)
MONO ABS: 0.5 10*3/uL (ref 0.1–1.0)
Monocytes Relative: 7 % (ref 3–12)
Neutro Abs: 5.4 10*3/uL (ref 1.7–7.7)
Neutrophils Relative %: 68 % (ref 43–77)
PLATELETS: 189 10*3/uL (ref 150–400)
RBC: 5.27 MIL/uL (ref 4.22–5.81)
RDW: 15.8 % — AB (ref 11.5–15.5)
WBC: 7.9 10*3/uL (ref 4.0–10.5)

## 2014-09-03 LAB — SEDIMENTATION RATE: SED RATE: 14 mm/h (ref 0–16)

## 2014-09-03 LAB — BASIC METABOLIC PANEL
ANION GAP: 5 (ref 5–15)
BUN: 32 mg/dL — ABNORMAL HIGH (ref 6–20)
CALCIUM: 8.5 mg/dL — AB (ref 8.9–10.3)
CO2: 24 mmol/L (ref 22–32)
CREATININE: 1.4 mg/dL — AB (ref 0.61–1.24)
Chloride: 107 mmol/L (ref 101–111)
GFR calc Af Amer: 49 mL/min — ABNORMAL LOW (ref >60)
GFR, EST NON AFRICAN AMERICAN: 42 mL/min — AB (ref >60)
Glucose, Bld: 169 mg/dL — ABNORMAL HIGH (ref 65–99)
POTASSIUM: 5.5 mmol/L — AB (ref 3.5–5.1)
Sodium: 136 mmol/L (ref 135–145)

## 2014-09-03 LAB — CBG MONITORING, ED: Glucose-Capillary: 126 mg/dL — ABNORMAL HIGH (ref 65–99)

## 2014-09-03 MED ORDER — HYDROCODONE-ACETAMINOPHEN 5-325 MG PO TABS: 0.5000 | ORAL_TABLET | Freq: Four times a day (QID) | ORAL | Status: DC | PRN

## 2014-09-03 NOTE — ED Notes (Signed)
MD at bedside. 

## 2014-09-03 NOTE — Discharge Instructions (Signed)
Present directly to Dr. Linna Hoff Jone's office for evaluation of your leg.  Your potassium was high today.  Be sure to drink plenty of fluids and do not use potassium substitutes.    Hyperkalemia Hyperkalemia is when you have too much potassium in your blood. This can be a life-threatening condition. Potassium is normally removed (excreted) from the body by the kidneys. CAUSES  The potassium level in your body can become too high for the following reasons:  You take in too much potassium. You can do this by:  Using salt substitutes. They contain large amounts of potassium.  Taking potassium supplements from your caregiver. The dose may be too high for you.  Eating foods or taking nutritional products with potassium.  You excrete too little potassium. This can happen if:  Your kidneys are not functioning properly. Kidney (renal) disease is a very common cause of hyperkalemia.  You are taking medicines that lower your excretion of potassium, such as certain diuretic medicines.  You have an adrenal gland disease called Addison's disease.  You have a urinary tract obstruction, such as kidney stones.  You are on treatment to mechanically clean your blood (dialysis) and you skip a treatment.  You release a high amount of potassium from your cells into your blood. You may have a condition that causes potassium to move from your cells to your bloodstream. This can happen with:  Injury to muscles or other tissues. Most potassium is stored in the muscles.  Severe burns or infections.  Acidic blood plasma (acidosis). Acidosis can result from many diseases, such as uncontrolled diabetes. SYMPTOMS  Usually, there are no symptoms unless the potassium is dangerously high or has risen very quickly. Symptoms may include:  Irregular or very slow heartbeat.  Feeling sick to your stomach (nauseous).  Tiredness (fatigue).  Nerve problems such as tingling of the skin, numbness of the hands or feet,  weakness, or paralysis. DIAGNOSIS  A simple blood test can measure the amount of potassium in your body. An electrocardiogram test of the heart can also help make the diagnosis. The heart may beat dangerously fast or slow down and stop beating with severe hyperkalemia.  TREATMENT  Treatment depends on how bad the condition is and on the underlying cause.  If the hyperkalemia is an emergency (causing heart problems or paralysis), many different medicines can be used alone or together to lower the potassium level briefly. This may include an insulin injection even if you are not diabetic. Emergency dialysis may be needed to remove potassium from the body.  If the hyperkalemia is less severe or dangerous, the underlying cause is treated. This can include taking medicines if needed. Your prescription medicines may be changed. You may also need to take a medicine to help your body get rid of potassium. You may need to eat a diet low in potassium. HOME CARE INSTRUCTIONS   Take medicines and supplements as directed by your caregiver.  Do not take any over-the-counter medicines, supplements, natural products, herbs, or vitamins without reviewing them with your caregiver. Certain supplements and natural food products can have high amounts of potassium. Other products (such as ibuprofen) can damage weak kidneys and raise your potassium.  You may be asked to do repeat lab tests. Be sure to follow these directions.  If you have kidney disease, you may need to follow a low potassium diet. SEEK MEDICAL CARE IF:   You notice an irregular or very slow heartbeat.  You feel lightheaded.  You  develop weakness that is unusual for you. SEEK IMMEDIATE MEDICAL CARE IF:   You have shortness of breath.  You have chest discomfort.  You pass out (faint). MAKE SURE YOU:   Understand these instructions.  Will watch your condition.  Will get help right away if you are not doing well or get worse. Document  Released: 02/25/2002 Document Revised: 05/30/2011 Document Reviewed: 06/12/2013 [facility] Patient Information 2015 [place], [place]. This information is not intended to replace advice given to you by your health care provider. Make sure you discuss any questions you have with your health care provider.

## 2014-09-03 NOTE — ED Notes (Signed)
Pt reports leg injury/DVT in 1983, has had issues with left lower leg since then. Pt reports he has chronic lower leg redness. But started having a flare up yesterday. Went to vein doctor yesterday, they ruled out DVT. Pt reports pain has increased to 10/10.

## 2014-09-03 NOTE — Addendum Note (Signed)
Addended by: Dorthula Rue L on: 09/03/2014 09:34 AM   Modules accepted: Orders

## 2014-09-03 NOTE — ED Provider Notes (Signed)
CSN: [id number]     Arrival date & time 09/03/14  1003 History   First MD Initiated Contact with Patient 09/03/14 1015     Chief Complaint  Patient presents with  . Leg Pain     Patient is a 81 y.o. male presenting with leg pain. The history is provided by the patient. No language interpreter was used.  Leg Pain  Mr. Nester presents for evaluation of a Flexeril pain. He has a history of injury and chronic problems with the left leg since an injury in 1983. He reports increased pain and swelling over the last 5-6 days with redness to the right shin and foot. His pain is most significant in the left ankle. The pain is constant but worse at times. He states he thrashes in his sleep often and is not sure if he has a new injury. He was seen in the vascular surgery office yesterday and had a negative DVT study. He denies any fevers, vomiting, systemic symptoms. He has a history of well controlled diabetes. He was treated for cellulitis the left lower extremity 2 weeks ago with doxycycline. He states this is much worse than how it was previously.  Past Medical History  Diagnosis Date  . Diabetes mellitus   . HTN (hypertension)   . Hyperlipemia   . Spinal stenosis   . Thalassemia   . History of Lyme disease   . PTSD (post-traumatic stress disorder)   . Cancer     left cheek  . Hemorrhoids 02/03/2005    Internal and external  . Diverticulosis of colon (without mention of hemorrhage) 02/03/2005  . Personal history of colonic polyps   . Benign neoplasm of colon   . Polymyalgia rheumatica   . Varicose veins   . Aortic stenosis    Past Surgical History  Procedure Laterality Date  . Cataract extraction, bilateral    . Kidney stone surgery    . Hemorroidectomy     Family History  Problem Relation Age of Onset  . Diabetes Father   . Leukemia Mother   . Hypertension Mother   . Arthritis Mother   . Diabetes Sister    History  Substance Use Topics  . Smoking status: Current Every  Day Smoker -- 78 years    Types: Cigars  . Smokeless tobacco: Never Used     Comment: pt states that he does not inhale  . Alcohol Use: No    Review of Systems  All other systems reviewed and are negative.     Allergies  Lamisil af defense  Home Medications   Prior to Admission medications   Medication Sig Start Date End Date Taking? Authorizing Provider  amLODipine (NORVASC) 5 MG tablet Take 5 mg by mouth as needed. 04/15/13   Lelon Perla, MD  aspirin EC 81 MG tablet Take 81 mg by mouth daily. 1/4 asprin daily    Historical Provider, MD  benazepril (LOTENSIN) 40 MG tablet Take 40 mg by mouth daily.    Historical Provider, MD  buPROPion (WELLBUTRIN) 100 MG tablet Take 100 mg by mouth every evening.     Historical Provider, MD  docusate sodium (COLACE) 100 MG capsule Take 100 mg by mouth daily.     Historical Provider, MD  fish oil-omega-3 fatty acids 1000 MG capsule Take 2 g by mouth daily.     Historical Provider, MD  glipiZIDE (GLUCOTROL) 5 MG tablet Take 5 mg by mouth daily before breakfast.    Historical Provider, MD  Linaclotide (LINZESS) 290 MCG CAPS capsule Take 1 capsule (290 mcg total) by mouth daily. Patient taking differently: Take 290 mcg by mouth daily as needed.  10/31/13   Amy S Esterwood, PA-C  metFORMIN (GLUCOPHAGE) 1000 MG tablet Take 500 mg by mouth 4 (four) times daily - after meals and at bedtime.     Historical Provider, MD  metoprolol succinate (TOPROL XL) 25 MG 24 hr tablet Take 1 tablet (25 mg total) by mouth daily. 08/29/14   Lelon Perla, MD  multivitamin-lutein [facility]) CAPS capsule Take 1 capsule by mouth daily.    Historical Provider, MD  polyethylene glycol (MIRALAX / GLYCOLAX) packet Take 17 g by mouth daily as needed.     Historical Provider, MD  Probiotic Product (PROBIOTIC DAILY PO) Take by mouth.    Historical Provider, MD   BP 149/67 mmHg  Pulse 63  Temp(Src) 97.5 F (36.4 C) (Oral)  Resp 16  SpO2 97% Physical Exam   Constitutional: He is oriented to person, place, and time. He appears well-developed and well-nourished.  HENT:  Head: Normocephalic and atraumatic.  Cardiovascular: Normal rate and regular rhythm.   No murmur heard. Pulmonary/Chest: Effort normal and breath sounds normal. No respiratory distress.  Abdominal: Soft. There is no tenderness. There is no rebound and no guarding.  Musculoskeletal:  Moderate erythema and edema to left foot, ankle, distal leg. Well-demarcated erythematous rash extends to just proximal to the knee. There are pustular lesions around the left lateral ankle. Faint DP pulses bilaterally, bounding femoral pulses bilaterally. Moderate tenderness over  bilateral malleoli.  Neurological: He is alert and oriented to person, place, and time.  Skin: Skin is warm and dry.  Psychiatric: He has a normal mood and affect. His behavior is normal.  Nursing note and vitals reviewed.   ED Course  Procedures (including critical care time) Labs Review Labs Reviewed  BASIC METABOLIC PANEL - Abnormal; Notable for the following:    Potassium 5.5 (*)    Glucose, Bld 169 (*)    BUN 32 (*)    Creatinine, Ser 1.40 (*)    Calcium 8.5 (*)    GFR calc non Af Amer 42 (*)    GFR calc Af Amer 49 (*)    All other components within normal limits  CBC WITH DIFFERENTIAL/PLATELET - Abnormal; Notable for the following:    RDW 15.8 (*)    Eosinophils Relative 8 (*)    All other components within normal limits  CBG MONITORING, ED - Abnormal; Notable for the following:    Glucose-Capillary 126 (*)    All other components within normal limits  SEDIMENTATION RATE    Imaging Review Dg Ankle Complete Left  09/03/2014   CLINICAL DATA:  Leg pain, chronic left lower leg redness  EXAM: LEFT ANKLE COMPLETE - 3+ VIEW  COMPARISON:  None.  FINDINGS: Three views of left ankle submitted. No acute fracture or subluxation. Ankle mortise is preserved. Mild spurring of distal tibia. There is diffuse soft  tissue swelling adjacent to lateral and medial malleolus. Small plantar spur of calcaneus. Atherosclerotic vascular calcifications are noted.  IMPRESSION: No acute fracture or subluxation. Diffuse soft tissue swelling. Ankle mortise is preserved. Atherosclerotic vascular calcifications.   Electronically Signed   By: Lahoma Crocker M.D.   On: 09/03/2014 11:16     EKG Interpretation None      MDM   Final diagnoses:  Rash and nonspecific skin eruption  Hyperkalemia    Patient here for evaluation of intermittent  leg pain and rash. He has been seen by vascular surgery yesterday and had a negative DVT study of the leg. He is well perfused on exam. Rashes not consistent with cellulitis, WBC, ESR and are within normal limits. Discussed with Dr. Tacy Learn with dermatology who agrees to see the patient in the office today. Patient does have baseline renal insufficiency and creatinine is near his baseline he does have some mild hyperkalemia with no changes on this EKG compared to prior. Discussed with patient the importance of recheck for his hyperkalemia. Patient does add potassium substitute to his food instead of salt substitute discussed discontinuing this and oral fluid hydration. Return precautions were discussed for rash and hyperkalemia.    Quintella Reichert, MD 09/04/14 [id number]

## 2014-09-04 ENCOUNTER — Telehealth (HOSPITAL_BASED_OUTPATIENT_CLINIC_OR_DEPARTMENT_OTHER): Payer: Self-pay | Admitting: Emergency Medicine

## 2014-09-05 DIAGNOSIS — K1370 Unspecified lesions of oral mucosa: Secondary | ICD-10-CM | POA: Diagnosis not present

## 2014-09-08 ENCOUNTER — Inpatient Hospital Stay (HOSPITAL_COMMUNITY)
Admission: EM | Admit: 2014-09-08 | Discharge: 2014-09-13 | DRG: 682 | Disposition: A | Discharge status: Home / Self Care | Payer: Medicare Other | Attending: Internal Medicine | Admitting: Internal Medicine

## 2014-09-08 ENCOUNTER — Emergency Department (HOSPITAL_COMMUNITY): Payer: Medicare Other

## 2014-09-08 ENCOUNTER — Encounter (HOSPITAL_COMMUNITY): Payer: Self-pay

## 2014-09-08 DIAGNOSIS — K5900 Constipation, unspecified: Secondary | ICD-10-CM | POA: Diagnosis not present

## 2014-09-08 DIAGNOSIS — R21 Rash and other nonspecific skin eruption: Secondary | ICD-10-CM | POA: Diagnosis not present

## 2014-09-08 DIAGNOSIS — I872 Venous insufficiency (chronic) (peripheral): Secondary | ICD-10-CM | POA: Diagnosis present

## 2014-09-08 DIAGNOSIS — I87309 Chronic venous hypertension (idiopathic) without complications of unspecified lower extremity: Secondary | ICD-10-CM | POA: Diagnosis present

## 2014-09-08 DIAGNOSIS — I350 Nonrheumatic aortic (valve) stenosis: Secondary | ICD-10-CM | POA: Diagnosis not present

## 2014-09-08 DIAGNOSIS — L0390 Cellulitis, unspecified: Secondary | ICD-10-CM | POA: Diagnosis present

## 2014-09-08 DIAGNOSIS — I8312 Varicose veins of left lower extremity with inflammation: Secondary | ICD-10-CM | POA: Diagnosis present

## 2014-09-08 DIAGNOSIS — E119 Type 2 diabetes mellitus without complications: Secondary | ICD-10-CM | POA: Diagnosis not present

## 2014-09-08 DIAGNOSIS — Z888 Allergy status to other drugs, medicaments and biological substances status: Secondary | ICD-10-CM

## 2014-09-08 DIAGNOSIS — I459 Conduction disorder, unspecified: Secondary | ICD-10-CM | POA: Diagnosis present

## 2014-09-08 DIAGNOSIS — R001 Bradycardia, unspecified: Secondary | ICD-10-CM | POA: Diagnosis present

## 2014-09-08 DIAGNOSIS — N289 Disorder of kidney and ureter, unspecified: Secondary | ICD-10-CM | POA: Diagnosis not present

## 2014-09-08 DIAGNOSIS — R1012 Left upper quadrant pain: Secondary | ICD-10-CM | POA: Diagnosis not present

## 2014-09-08 DIAGNOSIS — Z66 Do not resuscitate: Secondary | ICD-10-CM | POA: Diagnosis present

## 2014-09-08 DIAGNOSIS — M4800 Spinal stenosis, site unspecified: Secondary | ICD-10-CM | POA: Diagnosis present

## 2014-09-08 DIAGNOSIS — Z86010 Personal history of colonic polyps: Secondary | ICD-10-CM

## 2014-09-08 DIAGNOSIS — D126 Benign neoplasm of colon, unspecified: Secondary | ICD-10-CM | POA: Diagnosis present

## 2014-09-08 DIAGNOSIS — E875 Hyperkalemia: Secondary | ICD-10-CM | POA: Diagnosis present

## 2014-09-08 DIAGNOSIS — Z79891 Long term (current) use of opiate analgesic: Secondary | ICD-10-CM

## 2014-09-08 DIAGNOSIS — I2699 Other pulmonary embolism without acute cor pulmonale: Secondary | ICD-10-CM

## 2014-09-08 DIAGNOSIS — N179 Acute kidney failure, unspecified: Secondary | ICD-10-CM | POA: Diagnosis present

## 2014-09-08 DIAGNOSIS — I420 Dilated cardiomyopathy: Secondary | ICD-10-CM | POA: Diagnosis not present

## 2014-09-08 DIAGNOSIS — Z87442 Personal history of urinary calculi: Secondary | ICD-10-CM

## 2014-09-08 DIAGNOSIS — K219 Gastro-esophageal reflux disease without esophagitis: Secondary | ICD-10-CM | POA: Diagnosis present

## 2014-09-08 DIAGNOSIS — D569 Thalassemia, unspecified: Secondary | ICD-10-CM | POA: Diagnosis present

## 2014-09-08 DIAGNOSIS — L271 Localized skin eruption due to drugs and medicaments taken internally: Secondary | ICD-10-CM | POA: Diagnosis present

## 2014-09-08 DIAGNOSIS — K5909 Other constipation: Secondary | ICD-10-CM | POA: Diagnosis present

## 2014-09-08 DIAGNOSIS — Z881 Allergy status to other antibiotic agents status: Secondary | ICD-10-CM | POA: Diagnosis not present

## 2014-09-08 DIAGNOSIS — Z8249 Family history of ischemic heart disease and other diseases of the circulatory system: Secondary | ICD-10-CM

## 2014-09-08 DIAGNOSIS — E872 Acidosis: Secondary | ICD-10-CM | POA: Diagnosis present

## 2014-09-08 DIAGNOSIS — Z889 Allergy status to unspecified drugs, medicaments and biological substances status: Secondary | ICD-10-CM | POA: Diagnosis not present

## 2014-09-08 DIAGNOSIS — K649 Unspecified hemorrhoids: Secondary | ICD-10-CM | POA: Diagnosis present

## 2014-09-08 DIAGNOSIS — F329 Major depressive disorder, single episode, unspecified: Secondary | ICD-10-CM | POA: Diagnosis present

## 2014-09-08 DIAGNOSIS — F419 Anxiety disorder, unspecified: Secondary | ICD-10-CM | POA: Diagnosis present

## 2014-09-08 DIAGNOSIS — K5730 Diverticulosis of large intestine without perforation or abscess without bleeding: Secondary | ICD-10-CM | POA: Diagnosis present

## 2014-09-08 DIAGNOSIS — R011 Cardiac murmur, unspecified: Secondary | ICD-10-CM | POA: Diagnosis present

## 2014-09-08 DIAGNOSIS — Z806 Family history of leukemia: Secondary | ICD-10-CM

## 2014-09-08 DIAGNOSIS — E860 Dehydration: Secondary | ICD-10-CM | POA: Diagnosis present

## 2014-09-08 DIAGNOSIS — R0902 Hypoxemia: Secondary | ICD-10-CM | POA: Diagnosis present

## 2014-09-08 DIAGNOSIS — J811 Chronic pulmonary edema: Secondary | ICD-10-CM | POA: Diagnosis not present

## 2014-09-08 DIAGNOSIS — J810 Acute pulmonary edema: Secondary | ICD-10-CM | POA: Diagnosis present

## 2014-09-08 DIAGNOSIS — F17210 Nicotine dependence, cigarettes, uncomplicated: Secondary | ICD-10-CM | POA: Diagnosis present

## 2014-09-08 DIAGNOSIS — Z79899 Other long term (current) drug therapy: Secondary | ICD-10-CM

## 2014-09-08 DIAGNOSIS — Z720 Tobacco use: Secondary | ICD-10-CM | POA: Diagnosis present

## 2014-09-08 DIAGNOSIS — N10 Acute tubulo-interstitial nephritis: Secondary | ICD-10-CM | POA: Diagnosis present

## 2014-09-08 DIAGNOSIS — I10 Essential (primary) hypertension: Secondary | ICD-10-CM | POA: Diagnosis not present

## 2014-09-08 DIAGNOSIS — R918 Other nonspecific abnormal finding of lung field: Secondary | ICD-10-CM | POA: Diagnosis not present

## 2014-09-08 DIAGNOSIS — T7840XA Allergy, unspecified, initial encounter: Secondary | ICD-10-CM

## 2014-09-08 DIAGNOSIS — Z833 Family history of diabetes mellitus: Secondary | ICD-10-CM | POA: Diagnosis not present

## 2014-09-08 DIAGNOSIS — M353 Polymyalgia rheumatica: Secondary | ICD-10-CM | POA: Diagnosis present

## 2014-09-08 DIAGNOSIS — Z8619 Personal history of other infectious and parasitic diseases: Secondary | ICD-10-CM

## 2014-09-08 DIAGNOSIS — E785 Hyperlipidemia, unspecified: Secondary | ICD-10-CM | POA: Diagnosis present

## 2014-09-08 DIAGNOSIS — N170 Acute kidney failure with tubular necrosis: Secondary | ICD-10-CM | POA: Diagnosis not present

## 2014-09-08 DIAGNOSIS — R1032 Left lower quadrant pain: Secondary | ICD-10-CM | POA: Diagnosis not present

## 2014-09-08 DIAGNOSIS — R0602 Shortness of breath: Secondary | ICD-10-CM

## 2014-09-08 DIAGNOSIS — I509 Heart failure, unspecified: Secondary | ICD-10-CM | POA: Diagnosis not present

## 2014-09-08 DIAGNOSIS — F4310 Post-traumatic stress disorder, unspecified: Secondary | ICD-10-CM | POA: Diagnosis present

## 2014-09-08 DIAGNOSIS — F410 Panic disorder [episodic paroxysmal anxiety] without agoraphobia: Secondary | ICD-10-CM | POA: Diagnosis present

## 2014-09-08 DIAGNOSIS — S299XXA Unspecified injury of thorax, initial encounter: Secondary | ICD-10-CM | POA: Diagnosis not present

## 2014-09-08 DIAGNOSIS — S3991XA Unspecified injury of abdomen, initial encounter: Secondary | ICD-10-CM | POA: Diagnosis not present

## 2014-09-08 DIAGNOSIS — T361X5A Adverse effect of cephalosporins and other beta-lactam antibiotics, initial encounter: Secondary | ICD-10-CM | POA: Diagnosis present

## 2014-09-08 LAB — COMPREHENSIVE METABOLIC PANEL
ALK PHOS: 124 U/L (ref 38–126)
ALT: 16 U/L — ABNORMAL LOW (ref 17–63)
AST: 20 U/L (ref 15–41)
Albumin: 4.4 g/dL (ref 3.5–5.0)
Anion gap: 13 (ref 5–15)
BUN: 40 mg/dL — AB (ref 6–20)
CO2: 22 mmol/L (ref 22–32)
Calcium: 9.3 mg/dL (ref 8.9–10.3)
Chloride: 103 mmol/L (ref 101–111)
Creatinine, Ser: 1.97 mg/dL — ABNORMAL HIGH (ref 0.61–1.24)
GFR, EST AFRICAN AMERICAN: 32 mL/min — AB (ref >60)
GFR, EST NON AFRICAN AMERICAN: 28 mL/min — AB (ref >60)
GLUCOSE: 136 mg/dL — AB (ref 65–99)
POTASSIUM: 5.6 mmol/L — AB (ref 3.5–5.1)
Sodium: 138 mmol/L (ref 135–145)
Total Bilirubin: 0.6 mg/dL (ref 0.3–1.2)
Total Protein: 7.9 g/dL (ref 6.5–8.1)

## 2014-09-08 LAB — MAGNESIUM: Magnesium: 2 mg/dL (ref 1.7–2.4)

## 2014-09-08 LAB — CBC WITH DIFFERENTIAL/PLATELET
Basophils Absolute: 0.1 10*3/uL (ref 0.0–0.1)
Basophils Relative: 1 % (ref 0–1)
Eosinophils Absolute: 0.7 10*3/uL (ref 0.0–0.7)
Eosinophils Relative: 8 % — ABNORMAL HIGH (ref 0–5)
HCT: 47.1 % (ref 39.0–52.0)
Hemoglobin: 15.2 g/dL (ref 13.0–17.0)
LYMPHS ABS: 1.3 10*3/uL (ref 0.7–4.0)
LYMPHS PCT: 15 % (ref 12–46)
MCH: 26.5 pg (ref 26.0–34.0)
MCHC: 32.3 g/dL (ref 30.0–36.0)
MCV: 82.1 fL (ref 78.0–100.0)
MONOS PCT: 7 % (ref 3–12)
Monocytes Absolute: 0.6 10*3/uL (ref 0.1–1.0)
NEUTROS PCT: 69 % (ref 43–77)
Neutro Abs: 6.1 10*3/uL (ref 1.7–7.7)
Platelets: 245 10*3/uL (ref 150–400)
RBC: 5.74 MIL/uL (ref 4.22–5.81)
RDW: 15.8 % — ABNORMAL HIGH (ref 11.5–15.5)
WBC: 8.7 10*3/uL (ref 4.0–10.5)

## 2014-09-08 LAB — URINALYSIS, ROUTINE W REFLEX MICROSCOPIC
Bilirubin Urine: NEGATIVE
GLUCOSE, UA: NEGATIVE mg/dL
Hgb urine dipstick: NEGATIVE
Ketones, ur: NEGATIVE mg/dL
LEUKOCYTES UA: NEGATIVE
NITRITE: NEGATIVE
Protein, ur: NEGATIVE mg/dL
Specific Gravity, Urine: 1.014 (ref 1.005–1.030)
Urobilinogen, UA: 0.2 mg/dL (ref 0.0–1.0)
pH: 5.5 (ref 5.0–8.0)

## 2014-09-08 LAB — GLUCOSE, CAPILLARY
Glucose-Capillary: 116 mg/dL — ABNORMAL HIGH (ref 65–99)
Glucose-Capillary: 214 mg/dL — ABNORMAL HIGH (ref 65–99)

## 2014-09-08 MED ORDER — METHYLPREDNISOLONE SODIUM SUCC 125 MG IJ SOLR
60.0000 mg | INTRAMUSCULAR | Status: DC
  Administered 2014-09-08: 60 mg via INTRAVENOUS
  Filled 2014-09-08 (×2): qty 2

## 2014-09-08 MED ORDER — SODIUM CHLORIDE 0.9 % IV BOLUS (SEPSIS)
500.0000 mL | Freq: Once | INTRAVENOUS | Status: AC
  Administered 2014-09-08: 500 mL via INTRAVENOUS

## 2014-09-08 MED ORDER — SODIUM CHLORIDE 0.9 % IV SOLN: INTRAVENOUS | Status: AC

## 2014-09-08 MED ORDER — ONDANSETRON HCL 4 MG PO TABS: 4.0000 mg | ORAL_TABLET | Freq: Four times a day (QID) | ORAL | Status: DC | PRN

## 2014-09-08 MED ORDER — BUPROPION HCL 100 MG PO TABS
100.0000 mg | ORAL_TABLET | Freq: Every evening | ORAL | Status: DC
  Administered 2014-09-08 – 2014-09-12 (×5): 100 mg via ORAL
  Filled 2014-09-08 (×6): qty 1

## 2014-09-08 MED ORDER — ONDANSETRON HCL 4 MG/2ML IJ SOLN: 4.0000 mg | Freq: Four times a day (QID) | INTRAMUSCULAR | Status: DC | PRN

## 2014-09-08 MED ORDER — FAMOTIDINE IN NACL 20-0.9 MG/50ML-% IV SOLN
20.0000 mg | INTRAVENOUS | Status: DC
  Administered 2014-09-09: 20 mg via INTRAVENOUS
  Filled 2014-09-08: qty 50

## 2014-09-08 MED ORDER — ALBUTEROL SULFATE (2.5 MG/3ML) 0.083% IN NEBU: 2.5000 mg | INHALATION_SOLUTION | RESPIRATORY_TRACT | Status: DC | PRN

## 2014-09-08 MED ORDER — OCUVITE-LUTEIN PO CAPS
1.0000 | ORAL_CAPSULE | Freq: Every day | ORAL | Status: DC
  Administered 2014-09-09 – 2014-09-13 (×5): 1 via ORAL
  Filled 2014-09-08 (×6): qty 1

## 2014-09-08 MED ORDER — OMEGA-3-ACID ETHYL ESTERS 1 G PO CAPS
2.0000 g | ORAL_CAPSULE | ORAL | Status: DC
  Administered 2014-09-09 – 2014-09-13 (×3): 2 g via ORAL
  Filled 2014-09-08 (×7): qty 2

## 2014-09-08 MED ORDER — AMLODIPINE BESYLATE 5 MG PO TABS
5.0000 mg | ORAL_TABLET | ORAL | Status: DC | PRN
Start: 2014-09-08 — End: 2014-09-09

## 2014-09-08 MED ORDER — ACETAMINOPHEN 650 MG RE SUPP: 650.0000 mg | Freq: Four times a day (QID) | RECTAL | Status: DC | PRN

## 2014-09-08 MED ORDER — ENOXAPARIN SODIUM 30 MG/0.3ML ~~LOC~~ SOLN
30.0000 mg | SUBCUTANEOUS | Status: DC
  Administered 2014-09-08 – 2014-09-12 (×5): 30 mg via SUBCUTANEOUS
  Filled 2014-09-08 (×4): qty 0.3

## 2014-09-08 MED ORDER — SODIUM CHLORIDE 0.9 % IV SOLN
INTRAVENOUS | Status: DC
  Administered 2014-09-08 – 2014-09-10 (×4): via INTRAVENOUS

## 2014-09-08 MED ORDER — POLYETHYLENE GLYCOL 3350 17 G PO PACK
17.0000 g | PACK | Freq: Every day | ORAL | Status: DC
  Administered 2014-09-09 – 2014-09-13 (×4): 17 g via ORAL
  Filled 2014-09-08 (×5): qty 1

## 2014-09-08 MED ORDER — ACETAMINOPHEN 325 MG PO TABS
650.0000 mg | ORAL_TABLET | Freq: Four times a day (QID) | ORAL | Status: DC | PRN
  Administered 2014-09-08: 650 mg via ORAL
  Filled 2014-09-08: qty 2

## 2014-09-08 MED ORDER — INSULIN ASPART 100 UNIT/ML ~~LOC~~ SOLN
0.0000 [IU] | Freq: Three times a day (TID) | SUBCUTANEOUS | Status: DC
  Administered 2014-09-09 (×2): 2 [IU] via SUBCUTANEOUS
  Administered 2014-09-09: 3 [IU] via SUBCUTANEOUS
  Administered 2014-09-10 (×2): 2 [IU] via SUBCUTANEOUS
  Administered 2014-09-10 – 2014-09-11 (×2): 3 [IU] via SUBCUTANEOUS
  Administered 2014-09-11 – 2014-09-12 (×2): 2 [IU] via SUBCUTANEOUS
  Administered 2014-09-12: 1 [IU] via SUBCUTANEOUS
  Administered 2014-09-13: 3 [IU] via SUBCUTANEOUS
  Administered 2014-09-13: 1 [IU] via SUBCUTANEOUS

## 2014-09-08 MED ORDER — FAMOTIDINE IN NACL 20-0.9 MG/50ML-% IV SOLN
20.0000 mg | Freq: Two times a day (BID) | INTRAVENOUS | Status: DC
  Filled 2014-09-08: qty 50

## 2014-09-08 MED ORDER — HYDROCODONE-ACETAMINOPHEN 5-325 MG PO TABS
1.0000 | ORAL_TABLET | ORAL | Status: DC | PRN
  Administered 2014-09-09 (×2): 1 via ORAL
  Administered 2014-09-10: 2 via ORAL
  Administered 2014-09-12: 1 via ORAL
  Administered 2014-09-13: 2 via ORAL
  Filled 2014-09-08 (×2): qty 1
  Filled 2014-09-08: qty 2
  Filled 2014-09-08: qty 1
  Filled 2014-09-08: qty 2

## 2014-09-08 MED ORDER — DOCUSATE SODIUM 100 MG PO CAPS
100.0000 mg | ORAL_CAPSULE | Freq: Every day | ORAL | Status: DC
  Administered 2014-09-09 – 2014-09-13 (×5): 100 mg via ORAL
  Filled 2014-09-08 (×5): qty 1

## 2014-09-08 MED ORDER — SODIUM CHLORIDE 0.9 % IJ SOLN
3.0000 mL | Freq: Two times a day (BID) | INTRAMUSCULAR | Status: DC
  Administered 2014-09-08 – 2014-09-13 (×3): 3 mL via INTRAVENOUS

## 2014-09-08 MED ORDER — DIPHENHYDRAMINE HCL 50 MG/ML IJ SOLN
12.5000 mg | Freq: Two times a day (BID) | INTRAMUSCULAR | Status: DC
  Administered 2014-09-08: 12.5 mg via INTRAVENOUS
  Filled 2014-09-08 (×2): qty 1

## 2014-09-08 MED ORDER — SODIUM CHLORIDE 0.9 % IV SOLN
INTRAVENOUS | Status: DC
  Administered 2014-09-08: 17:00:00 via INTRAVENOUS

## 2014-09-08 MED ORDER — BISACODYL 5 MG PO TBEC
5.0000 mg | DELAYED_RELEASE_TABLET | Freq: Every day | ORAL | Status: DC | PRN
  Administered 2014-09-09: 5 mg via ORAL
  Filled 2014-09-08: qty 1

## 2014-09-08 NOTE — H&P (Signed)
[facility] History and Physical  Antonio Buchanan LYY:[id number] DOB: 09-Oct-1921 DOA: 09/08/2014  Referring physician: Dr Aline Brochure PCP: Antonio Lopes, MD   Chief Complaint: Rash  HPI: Antonio Buchanan is a 81 y.o. male  With history of diabetes mellitus, hypertension, hyperlipidemia, aortic stenosis, PTSD, diverticulosis who presents to the ED with worsening rash of his left lower extremity. Patient stated that approximately 2 weeks prior to admission he developed a cellulitis of his left ankle saw his PCP were placed him on doxycycline and finish a ten-day course. Patient stated that cellulitis improved. Patient states however once he was off the antibiotics his  cellulitis had worsened. Patient stated he subsequently ended up seeing a dermatologist who did studies on his leg including a KOH prep and some scrapings and place patient on Keflex. Patient stated he started Keflex on 09/03/2014 however subsequently he broke out in the rash on his upper extremities and on his thigh region. Patient subsequently stopped the Keflex and called the dermatologist. Patient stated that a new prescription was given to him on Bactrim and Cipro however he has not started taking these. Patient subsequently presented back to the emergency room secondary to rash. Patient denies any fevers, no chills, no nausea, no vomiting, no chest, no shortness of breath, no abdominal pain, no diarrhea, no cough, no dysuria, no weakness. Patient does endorse some agent. Patient does endorse a chronic constipation which is unchanged.  Patient was seen in the emergency room, basic metabolic profile obtain a potassium of 5.6 BUN of 40 creatinine of 1.97 AST of 20 ALT of 16 otherwise was within normal limits. CBC obtained at the elevated eosinophil level. Patient stated had recent Dopplers done on 09/02/2014 which was negative for DVT. ED physician stated he spoke with Dr. Danella Sensing of [facility] dermatology who  reviewed the and stated that [place] which was done in the office was negative for fungus and tissue culture grew out staph aureus and not MRSA. They thought patient may have been having a reaction to the Keflex and he was notified and called in Bactrim and Cipro ablated. Patient has not started this yet. [facility] hospitalists, were called to admit the patient for further evaluation and management.   Review of Systems:  As per history of present illness otherwise negative. Constitutional:  No weight loss, night sweats, Fevers, chills, fatigue.  HEENT:  No headaches, Difficulty swallowing,Tooth/dental problems,Sore throat,  No sneezing, itching, ear ache, nasal congestion, post nasal drip,  Cardio-vascular:  No chest pain, Orthopnea, PND, swelling in lower extremities, anasarca, dizziness, palpitations  GI:  No heartburn, indigestion, abdominal pain, nausea, vomiting, diarrhea, change in bowel habits, loss of appetite  Resp:  No shortness of breath with exertion or at rest. No excess mucus, no productive cough, No non-productive cough, No coughing up of blood.No change in color of mucus.No wheezing.No chest wall deformity  Skin:  no rash or lesions.  GU:  no dysuria, change in color of urine, no urgency or frequency. No flank pain.  Musculoskeletal:  No joint pain or swelling. No decreased range of motion. No back pain.  Psych:  No change in mood or affect. No depression or anxiety. No memory loss.   Past Medical History  Diagnosis Date  . Diabetes mellitus   . HTN (hypertension)   . Hyperlipemia   . Spinal stenosis   . Thalassemia   . History of Lyme disease   . PTSD (post-traumatic stress disorder)   . Cancer     left cheek  .  Hemorrhoids 02/03/2005    Internal and external  . Diverticulosis of colon (without mention of hemorrhage) 02/03/2005  . Personal history of colonic polyps   . Benign neoplasm of colon   . Polymyalgia rheumatica   . Varicose veins   . Aortic stenosis     Past Surgical History  Procedure Laterality Date  . Cataract extraction, bilateral    . Kidney stone surgery    . Hemorroidectomy     Social History:  reports that he has been smoking Cigars.  He has never used smokeless tobacco. He reports that he does not drink alcohol or use illicit drugs.  Allergies  Allergen Reactions  . Cephalosporins Hives and Itching  . Lamisil Af Defense [Tolnaftate]     Severe hives    Family History  Problem Relation Age of Onset  . Diabetes Father   . Leukemia Mother   . Hypertension Mother   . Arthritis Mother   . Diabetes Sister      Prior to Admission medications   Medication Sig Start Date End Date Taking? Authorizing Provider  amLODipine (NORVASC) 5 MG tablet Take 5 mg by mouth as needed (blood pressure).  04/15/13  Yes Lelon Perla, MD  benazepril (LOTENSIN) 40 MG tablet Take 40 mg by mouth daily.   Yes Historical Provider, MD  buPROPion (WELLBUTRIN) 100 MG tablet Take 100 mg by mouth every evening.    Yes Historical Provider, MD  docusate sodium (COLACE) 100 MG capsule Take 100 mg by mouth daily.    Yes Historical Provider, MD  fish oil-omega-3 fatty acids 1000 MG capsule Take 2 g by mouth every other day.    Yes Historical Provider, MD  glipiZIDE (GLUCOTROL) 5 MG tablet Take 5 mg by mouth daily before breakfast.   Yes Historical Provider, MD  GRAPE SEED EXTRACT PO Take 1 tablet by mouth daily.   Yes Historical Provider, MD  metFORMIN (GLUCOPHAGE) 1000 MG tablet Take 500 mg by mouth 4 (four) times daily - after meals and at bedtime.    Yes Historical Provider, MD  multivitamin-lutein (OCUVITE-LUTEIN) CAPS capsule Take 1 capsule by mouth daily.   Yes Historical Provider, MD  polyethylene glycol (MIRALAX / GLYCOLAX) packet Take 17 g by mouth daily.    Yes Historical Provider, MD  Probiotic Product (PROBIOTIC DAILY PO) Take 1 tablet by mouth 2 (two) times a week.    Yes Historical Provider, MD  HYDROcodone-acetaminophen (NORCO/VICODIN)  5-325 MG per tablet Take 0.5-1 tablets by mouth every 6 (six) hours as needed. Patient not taking: Reported on 09/08/2014 09/03/14   Quintella Reichert, MD  Linaclotide [facility]) 290 MCG CAPS capsule Take 1 capsule (290 mcg total) by mouth daily. Patient not taking: Reported on 09/08/2014 10/31/13   Amy S Esterwood, PA-C  metoprolol succinate (TOPROL XL) 25 MG 24 hr tablet Take 1 tablet (25 mg total) by mouth daily. Patient not taking: Reported on 09/03/2014 08/29/14   Lelon Perla, MD   Physical Exam: Filed Vitals:   09/08/14 1105 09/08/14 1427 09/08/14 1600 09/08/14 1630  BP: 148/59 137/82 151/69 154/83  Pulse: 54 64 58   Temp: 97.8 F (36.6 C) 97.3 F (36.3 C)    TempSrc: Oral Axillary    Resp: 17 18 13 14   SpO2: 98% 99% 100%     Wt Readings from Last 3 Encounters:  09/02/14 72.576 kg (160 lb)  08/29/14 73.029 kg (161 lb)  07/01/14 71.487 kg (157 lb 9.6 oz)    General:  Elderly  well-developed well-nourished gentleman in no acute cardiopulmonary distress. Speaking in full sentences. Eyes: PERRLA, EOMI, normal lids, irises & conjunctiva ENT: grossly normal hearing, lips & tongue.extremely dry mucous membranes.  Neck: no LAD, masses or thyromegaly Cardiovascular: RRR with a 3/6 systolic ejection murmur. Left lower extremity with 1-2+ edema which is chronic per patient. Telemetry: SR, no arrhythmias  Respiratory: CTA bilaterally, no w/r/r. Normal respiratory effort. Abdomen: soft, ntnd, positive bowel sounds, no rebound, no guarding.  Skin:  Distal left lower extremity with chronic appearing erythema with demarcation border just below the left knee. Several excoriating papular rash noted sparsely on lower extremities and also on bilateral upper extremities. No oral lesions noted. Musculoskeletal: grossly normal tone BUE/BLE Psychiatric: grossly normal mood and affect, speech fluent and appropriate Neurologic:  Alert and oriented 3. Cranial nerves II through XII are grossly intact. No  focal deficits.           Labs on Admission:  Basic Metabolic Panel:  Recent Labs Lab 09/03/14 1052 09/08/14 1336  NA 136 138  K 5.5* 5.6*  CL 107 103  CO2 24 22  GLUCOSE 169* 136*  BUN 32* 40*  CREATININE 1.40* 1.97*  CALCIUM 8.5* 9.3   Liver Function Tests:  Recent Labs Lab 09/08/14 1336  AST 20  ALT 16*  ALKPHOS 124  BILITOT 0.6  PROT 7.9  ALBUMIN 4.4   No results for input(s): LIPASE, AMYLASE in the last 168 hours. No results for input(s): AMMONIA in the last 168 hours. CBC:  Recent Labs Lab 09/03/14 1052 09/08/14 1336  WBC 7.9 8.7  NEUTROABS 5.4 6.1  HGB 14.1 15.2  HCT 42.7 47.1  MCV 81.0 82.1  PLT 189 245   Cardiac Enzymes: No results for input(s): CKTOTAL, CKMB, CKMBINDEX, TROPONINI in the last 168 hours.  BNP (last 3 results) No results for input(s): BNP in the last 8760 hours.  ProBNP (last 3 results) No results for input(s): PROBNP in the last 8760 hours.  CBG:  Recent Labs Lab 09/03/14 1240  GLUCAP 126*    Radiological Exams on Admission: No results found.  EKG: None  Assessment/Plan Principal Problem:   ARF (acute renal failure) Active Problems:   GERD   Diabetes mellitus   HTN (hypertension)   Hyperlipemia   Spinal stenosis   PTSD (post-traumatic stress disorder)   Murmur   Tobacco abuse   Chronic venous hypertension without complications   Aortic stenosis   Chronic constipation   Congestive dilated cardiomyopathy   Rash   Venous stasis dermatitis of left lower extremity   Dehydration   Allergic reaction caused by a drug   #1 acute renal failure Patient noted to have a creatinine of 1.97 today. Creatinine was 1.4  09/03/2014 and on 07/2013 was 1.19. Likely secondary to acute interstitial nephritis secondary to antibiotics of Keflex as patient is noted to have a eosinophilia on CBC versus a prerenal azotemia in the setting of ACE inhibitor. Patient also noted to be clinically dehydrated. Will admit the patient to  telemetry. Check a UA with cultures and sensitivities. Check a urine eosinophilia. Check a fractional excretion of sodium. Place on IV fluids. Hold nephrotoxic agents. Hold ACE inhibitor. Follow. If no improvement in renal function will need to consult with nephrology.  #2 hyperkalemia Likely secondary to problem #1. Repeat potassium level. If still elevated will give some Kayexalate.  #3 left lower extremity erythema/?? Cellulitis Patient was seen by dermatologist initially started on Keflex however due to probe for allergic reaction  Keflex was discontinued. Patient states left lower extremity erythema has worsened since that time. Patient was processed at Bactrim and ciprofloxacin per dermatology however has not started those yet. Will hold off on those for now. Patient will need to follow-up with dermatology as outpatient. Will hold off on starting patient on Bactrim secondary to problem #1. If antibiotics are considered may consider resuming patient back on doxycycline.  #4 probable allergic reaction Patient with a rash after starting Keflex. Concern for possible drug reaction. Discontinue antibiotics. Will place on IV Solu-Medrol, IV Benadryl, IV Pepcid. Follow.  #5 dehydration IV fluids.  #6 PTSD /depression/anxiety Stable. Continue Wellbutrin.  #7 diabetes mellitus Check a hemoglobin A1c. Hold oral hypoglycemic agents. Place on a sliding scale insulin.  #8 hypertension Continue metoprolol and Norvasc.  #9 constipation Continue home bowel regimen.  #10 prophylaxis Pepcid for GI prophylaxis. Lovenox for DVT prophylaxis.  Code Status: DNR DVT Prophylaxis: Lovenox Family Communication: updated patient and wife at bedside. Disposition Plan: Admit to telemetry.  Time spent: [facility] Pager [phone]

## 2014-09-08 NOTE — ED Notes (Signed)
EKG given to Dr. Harrison 

## 2014-09-08 NOTE — ED Provider Notes (Signed)
CSN: [id number]     Arrival date & time 09/08/14  1057 History   First MD Initiated Contact with Patient 09/08/14 1327     Chief Complaint  Patient presents with  . Cellulitis     (Consider location/radiation/quality/duration/timing/severity/associated sxs/prior Treatment) Patient is a 81 y.o. male presenting with rash. The history is provided by the patient.  Rash Location: LE's, UE's. Quality: itchiness and redness   Severity:  Moderate Onset quality:  Gradual Duration: 1-2 weeks. Timing:  Constant Progression:  Worsening Chronicity:  New Context comment:  Unknown Relieved by:  Nothing Worsened by:  Nothing tried Ineffective treatments: abx. Associated symptoms: no abdominal pain, no diarrhea, no fever, no headaches, no nausea, no shortness of breath and not vomiting     Past Medical History  Diagnosis Date  . Diabetes mellitus   . HTN (hypertension)   . Hyperlipemia   . Spinal stenosis   . Thalassemia   . History of Lyme disease   . PTSD (post-traumatic stress disorder)   . Cancer     left cheek  . Hemorrhoids 02/03/2005    Internal and external  . Diverticulosis of colon (without mention of hemorrhage) 02/03/2005  . Personal history of colonic polyps   . Benign neoplasm of colon   . Polymyalgia rheumatica   . Varicose veins   . Aortic stenosis    Past Surgical History  Procedure Laterality Date  . Cataract extraction, bilateral    . Kidney stone surgery    . Hemorroidectomy     Family History  Problem Relation Age of Onset  . Diabetes Father   . Leukemia Mother   . Hypertension Mother   . Arthritis Mother   . Diabetes Sister    History  Substance Use Topics  . Smoking status: Current Every Day Smoker -- 78 years    Types: Cigars  . Smokeless tobacco: Never Used     Comment: pt states that he does not inhale  . Alcohol Use: No    Review of Systems  Constitutional: Negative for fever.  HENT: Negative for drooling and rhinorrhea.   Eyes:  Negative for pain.  Respiratory: Negative for cough and shortness of breath.   Cardiovascular: Negative for chest pain and leg swelling.  Gastrointestinal: Negative for nausea, vomiting, abdominal pain and diarrhea.  Genitourinary: Negative for dysuria and hematuria.  Musculoskeletal: Negative for gait problem and neck pain.  Skin: Positive for rash. Negative for color change.  Neurological: Negative for numbness and headaches.  Hematological: Negative for adenopathy.  Psychiatric/Behavioral: Negative for behavioral problems.  All other systems reviewed and are negative.     Allergies  Lamisil af defense  Home Medications   Prior to Admission medications   Medication Sig Start Date End Date Taking? Authorizing Provider  amLODipine (NORVASC) 5 MG tablet Take 5 mg by mouth as needed (blood pressure).  04/15/13  Yes Lelon Perla, MD  benazepril (LOTENSIN) 40 MG tablet Take 40 mg by mouth daily.   Yes Historical Provider, MD  buPROPion (WELLBUTRIN) 100 MG tablet Take 100 mg by mouth every evening.    Yes Historical Provider, MD  docusate sodium (COLACE) 100 MG capsule Take 100 mg by mouth daily.    Yes Historical Provider, MD  fish oil-omega-3 fatty acids 1000 MG capsule Take 2 g by mouth every other day.    Yes Historical Provider, MD  glipiZIDE (GLUCOTROL) 5 MG tablet Take 5 mg by mouth daily before breakfast.   Yes Historical Provider, MD  GRAPE SEED EXTRACT PO Take 1 tablet by mouth daily.   Yes Historical Provider, MD  metFORMIN (GLUCOPHAGE) 1000 MG tablet Take 500 mg by mouth 4 (four) times daily - after meals and at bedtime.    Yes Historical Provider, MD  multivitamin-lutein (OCUVITE-LUTEIN) CAPS capsule Take 1 capsule by mouth daily.   Yes Historical Provider, MD  polyethylene glycol (MIRALAX / GLYCOLAX) packet Take 17 g by mouth daily.    Yes Historical Provider, MD  Probiotic Product (PROBIOTIC DAILY PO) Take 1 tablet by mouth 2 (two) times a week.    Yes Historical  Provider, MD  HYDROcodone-acetaminophen (NORCO/VICODIN) 5-325 MG per tablet Take 0.5-1 tablets by mouth every 6 (six) hours as needed. Patient not taking: Reported on 09/08/2014 09/03/14   Quintella Reichert, MD  Linaclotide [facility]) 290 MCG CAPS capsule Take 1 capsule (290 mcg total) by mouth daily. Patient not taking: Reported on 09/08/2014 10/31/13   Amy S Esterwood, PA-C  metoprolol succinate (TOPROL XL) 25 MG 24 hr tablet Take 1 tablet (25 mg total) by mouth daily. Patient not taking: Reported on 09/03/2014 08/29/14   Lelon Perla, MD   BP 148/59 mmHg  Pulse 54  Temp(Src) 97.8 F (36.6 C) (Oral)  Resp 17  SpO2 98% Physical Exam  Constitutional: He is oriented to person, place, and time. He appears well-developed and well-nourished.  HENT:  Head: Normocephalic and atraumatic.  Right Ear: External ear normal.  Left Ear: External ear normal.  Nose: Nose normal.  Mouth/Throat: Oropharynx is clear and moist. No oropharyngeal exudate.  Eyes: Conjunctivae and EOM are normal. Pupils are equal, round, and reactive to light.  Neck: Normal range of motion. Neck supple.  Cardiovascular: Normal rate, regular rhythm, normal heart sounds and intact distal pulses.  Exam reveals no gallop and no friction rub.   No murmur heard. Pulmonary/Chest: Effort normal and breath sounds normal. No respiratory distress. He has no wheezes.  Abdominal: Soft. Bowel sounds are normal. He exhibits no distension. There is no tenderness. There is no rebound and no guarding.  Musculoskeletal: Normal range of motion. He exhibits no edema or tenderness.  Neurological: He is alert and oriented to person, place, and time.  Skin: Skin is warm and dry. Rash noted.  Chronic appearing erythema to distal left lower extremity with demarcation border just below the left knee.  Several excoriating papular lesions noted sparsely on the lower extremities and also on the upper extremities.  No oral lesions noted.  Psychiatric: He  has a normal mood and affect. His behavior is normal.  Nursing note and vitals reviewed.   ED Course  Procedures (including critical care time) Labs Review Labs Reviewed  COMPREHENSIVE METABOLIC PANEL - Abnormal; Notable for the following:    Potassium 5.6 (*)    Glucose, Bld 136 (*)    BUN 40 (*)    Creatinine, Ser 1.97 (*)    ALT 16 (*)    GFR calc non Af Amer 28 (*)    GFR calc Af Amer 32 (*)    All other components within normal limits  CBC WITH DIFFERENTIAL/PLATELET - Abnormal; Notable for the following:    RDW 15.8 (*)    Eosinophils Relative 8 (*)    All other components within normal limits  HEMOGLOBIN A1C - Abnormal; Notable for the following:    Hgb A1c MFr Bld 6.1 (*)    All other components within normal limits  CBC WITH DIFFERENTIAL/PLATELET - Abnormal; Notable for the following:  RDW 15.6 (*)    Neutrophils Relative % 78 (*)    Monocytes Relative 2 (*)    All other components within normal limits  BASIC METABOLIC PANEL - Abnormal; Notable for the following:    CO2 18 (*)    Glucose, Bld 202 (*)    BUN 43 (*)    Creatinine, Ser 1.96 (*)    Calcium 8.3 (*)    GFR calc non Af Amer 28 (*)    GFR calc Af Amer 33 (*)    All other components within normal limits  GLUCOSE, CAPILLARY - Abnormal; Notable for the following:    Glucose-Capillary 116 (*)    All other components within normal limits  GLUCOSE, CAPILLARY - Abnormal; Notable for the following:    Glucose-Capillary 214 (*)    All other components within normal limits  GLUCOSE, CAPILLARY - Abnormal; Notable for the following:    Glucose-Capillary 184 (*)    All other components within normal limits  GLUCOSE, CAPILLARY - Abnormal; Notable for the following:    Glucose-Capillary 216 (*)    All other components within normal limits  URINE CULTURE  URINALYSIS, ROUTINE W REFLEX MICROSCOPIC (NOT AT [facility])  SODIUM, URINE, RANDOM  CREATININE, URINE, RANDOM  MAGNESIUM  CYTOLOGY - NON PAP    Imaging  Review Dg Ribs Unilateral W/chest Left  09/08/2014   CLINICAL DATA:  Left lower flank pain.  Fall today.  EXAM: LEFT RIBS AND CHEST - 3+ VIEW  COMPARISON:  11/27/2006  FINDINGS: Heart is upper limits normal in size. Tortuosity of the thoracic aorta with scattered calcifications. No confluent opacities in the lungs. Scarring or atelectasis at the left base. No visible rib fracture.  IMPRESSION: No visible rib fracture or pneumothorax.  Left base atelectasis or scarring.   Electronically Signed   By: Rolm Baptise M.D.   On: 09/08/2014 17:17     EKG Interpretation None         MDM   Final diagnoses:  Rash  Renal insufficiency    2:59 PM 81 y.o. male history of diabetes who presents with worsening rash to his lower extremities. He has been seen by several providers recently with noncontributory workups. He was seen by vascular surgery who ruled out a DVT. He was seen in the emergency department and found to have some mild renal insufficiency and elevated potassium. He had previously been on doxycycline for possible cellulitis but had no improvement after the antibiotic. He saw dermatology recently as well and had a KOH which was negative for fungus as well as a culture of the tissue which grew out staph (not MRSA) he has been on Keflex for the last few days but thought he was having a reaction to it. His dermatologist was notified and they called him in Bactrim and Cipro but it appears that he has not started taking this yet. I discussed the case with Dr. Danella Sensing with [facility] who reviewed his most recent notes with me. The patient does have close follow-up with dermatology tomorrow.  Will admit for worsening Cr and elevating K. Ddx for rash includes drug reaction, cellulitis, small vessel vasculitis, etc.   Pamella Pert, MD 09/09/14 1611

## 2014-09-08 NOTE — ED Notes (Signed)
Pt c/o LLE pain and swelling.  Pain score 10/10.  Sts he has been diagnosed w/ staph and he is allergic to the prescribed antibiotic.  Redness, warmth, and swelling noted.  When asked about pain medication,  Pt sts "I don't take pain medicine, I like to suffer."

## 2014-09-08 NOTE — ED Notes (Signed)
Pt can go to floor at 16:35. 

## 2014-09-09 ENCOUNTER — Inpatient Hospital Stay (HOSPITAL_COMMUNITY): Payer: Medicare Other

## 2014-09-09 LAB — GLUCOSE, CAPILLARY
GLUCOSE-CAPILLARY: 184 mg/dL — AB (ref 65–99)
Glucose-Capillary: 216 mg/dL — ABNORMAL HIGH (ref 65–99)
Glucose-Capillary: 170 mg/dL — ABNORMAL HIGH (ref 65–99)
Glucose-Capillary: 222 mg/dL — ABNORMAL HIGH (ref 65–99)

## 2014-09-09 LAB — BASIC METABOLIC PANEL
ANION GAP: 11 (ref 5–15)
BUN: 43 mg/dL — ABNORMAL HIGH (ref 6–20)
CALCIUM: 8.3 mg/dL — AB (ref 8.9–10.3)
CO2: 18 mmol/L — ABNORMAL LOW (ref 22–32)
CREATININE: 1.96 mg/dL — AB (ref 0.61–1.24)
Chloride: 106 mmol/L (ref 101–111)
GFR, EST AFRICAN AMERICAN: 33 mL/min — AB (ref >60)
GFR, EST NON AFRICAN AMERICAN: 28 mL/min — AB (ref >60)
Glucose, Bld: 202 mg/dL — ABNORMAL HIGH (ref 65–99)
Potassium: 4.7 mmol/L (ref 3.5–5.1)
Sodium: 135 mmol/L (ref 135–145)

## 2014-09-09 LAB — CBC WITH DIFFERENTIAL/PLATELET
Basophils Absolute: 0 10*3/uL (ref 0.0–0.1)
Basophils Relative: 0 % (ref 0–1)
Eosinophils Absolute: 0 10*3/uL (ref 0.0–0.7)
Eosinophils Relative: 1 % (ref 0–5)
HCT: 41.7 % (ref 39.0–52.0)
Hemoglobin: 13.4 g/dL (ref 13.0–17.0)
LYMPHS ABS: 0.8 10*3/uL (ref 0.7–4.0)
LYMPHS PCT: 19 % (ref 12–46)
MCH: 26.2 pg (ref 26.0–34.0)
MCHC: 32.1 g/dL (ref 30.0–36.0)
MCV: 81.6 fL (ref 78.0–100.0)
Monocytes Absolute: 0.1 10*3/uL (ref 0.1–1.0)
Monocytes Relative: 2 % — ABNORMAL LOW (ref 3–12)
NEUTROS PCT: 78 % — AB (ref 43–77)
Neutro Abs: 3.3 10*3/uL (ref 1.7–7.7)
PLATELETS: 217 10*3/uL (ref 150–400)
RBC: 5.11 MIL/uL (ref 4.22–5.81)
RDW: 15.6 % — ABNORMAL HIGH (ref 11.5–15.5)
WBC: 4.2 10*3/uL (ref 4.0–10.5)

## 2014-09-09 LAB — CREATININE, URINE, RANDOM: Creatinine, Urine: 56.09 mg/dL

## 2014-09-09 LAB — HEMOGLOBIN A1C
Hgb A1c MFr Bld: 6.1 % — ABNORMAL HIGH (ref 4.8–5.6)
Mean Plasma Glucose: 128 mg/dL

## 2014-09-09 LAB — SODIUM, URINE, RANDOM: Sodium, Ur: 109 mmol/L

## 2014-09-09 MED ORDER — DIPHENHYDRAMINE HCL 12.5 MG/5ML PO ELIX
12.5000 mg | ORAL_SOLUTION | Freq: Three times a day (TID) | ORAL | Status: DC
  Administered 2014-09-09 – 2014-09-13 (×14): 12.5 mg via ORAL
  Filled 2014-09-09 (×14): qty 5

## 2014-09-09 MED ORDER — PREDNISONE 20 MG PO TABS
60.0000 mg | ORAL_TABLET | Freq: Every day | ORAL | Status: AC
  Administered 2014-09-09 – 2014-09-11 (×3): 60 mg via ORAL
  Filled 2014-09-09 (×3): qty 3

## 2014-09-09 MED ORDER — FAMOTIDINE 20 MG PO TABS
20.0000 mg | ORAL_TABLET | Freq: Every day | ORAL | Status: DC
  Administered 2014-09-10 – 2014-09-13 (×4): 20 mg via ORAL
  Filled 2014-09-09 (×4): qty 1

## 2014-09-09 MED ORDER — SODIUM BICARBONATE 650 MG PO TABS
650.0000 mg | ORAL_TABLET | Freq: Two times a day (BID) | ORAL | Status: AC
  Administered 2014-09-09 – 2014-09-11 (×4): 650 mg via ORAL
  Filled 2014-09-09 (×4): qty 1

## 2014-09-09 MED ORDER — FAMOTIDINE 20 MG PO TABS: 20.0000 mg | ORAL_TABLET | Freq: Two times a day (BID) | ORAL | Status: DC

## 2014-09-09 MED ORDER — DIPHENHYDRAMINE HCL 12.5 MG/5ML PO ELIX: 12.5000 mg | ORAL_SOLUTION | Freq: Three times a day (TID) | ORAL | Status: DC | PRN

## 2014-09-09 NOTE — Progress Notes (Signed)
[facility] PROGRESS NOTE  Antonio Buchanan ZOX:[id number] DOB: 01-02-1922 DOA: 09/08/2014 PCP: Donnajean Lopes, MD  Assessment/Plan: #1 acute renal failure Questionable etiology. May be secondary to acute interstitial nephritis as eosinophils versus a prerenal azotemia versus post renal azotemia. Eosinophils were noted on CBC. Urine is eosinophils pending. Fractional excretion of sodium is 2.82%. Renal function unchanged. Check a renal ultrasound. Continue to hold antibiotics at this time. Continue gentle hydration at 75 mL per hour. If no improvement in renal function or worsening renal function will need a nephrology consultation.  #2 hyperkalemia Likely secondary to problem #1 versus oral supplementation. Resolved. Follow.  #3 left lower extremity erythema/questionable cellulitis/questionable venous stasis dermatitis Erythema slightly improved from admission however still erythematous with some edema. Patient had lower extremity Dopplers done to admission which were negative for DVT. Patient was seen by dermatologist, and initially placed on Keflex however this was consistent discontinued secondary to concern for probable allergic reaction. KOH was done which was negative. Staff was noted on scraping per discussion of the ED doctor with dermatologist and had recommended starting patient on Bactrim and Cipro. In light of problem #1 Will hold off on antibiotics at this time. Patient is currently afebrile. Patient with a normal white count. Patient only to follow-up with dermatology as outpatient.  #4 probable allergic reaction Secondary to Keflex. Rash is less achy. Some improvement with rash. Continue IV Pepcid, Benadryl, steroids.  #5 dehydration IV fluids.  #6 PTSD/depression/anxiety Stable. Continue Wellbutrin.  #7 diabetes mellitus well-controlled Hemoglobin A1c is 6.1. CBG 184 04/22/2012. Continue to hold oral hypoglycemic agents. Sliding scale insulin.  #8  hypertension Stable. Continue Norvasc and metoprolol.  #9 Constipation Continue current bowel regimen.  #10 prophylaxis Pepcid for GI prophylaxis. Lovenox for DVT prophylaxis.   Code Status: DO NOT RESUSCITATE Family Communication: Updated patient. No family at bedside. Disposition Plan: Remain inpatient. Discharge when acute renal failure has resolved. Patient will need to reschedule outpatient follow-up with dermatologist.   Consultants:  None  Procedures:  X-ray of the ribs 09/08/2014    Antibiotics:  None  HPI/Subjective: Patient states he is making urine. Patient with no shortness of breath. No abdominal pain. No complaints.  Objective: Filed Vitals:   09/09/14 0524  BP: 132/65  Pulse: 68  Temp: 97.7 F (36.5 C)  Resp: 18    Intake/Output Summary (Last 24 hours) at 09/09/14 1100 Last data filed at 09/09/14 1034  Gross per 24 hour  Intake 836.25 ml  Output    351 ml  Net 485.25 ml   Filed Weights   09/08/14 1738 09/09/14 0524  Weight: 71 kg (156 lb 8.4 oz) 71.215 kg (157 lb)    Exam:   General:  NAD  Cardiovascular: RRR with 3/6 SEM  Respiratory: Clear to auscultation bilaterally.  Abdomen: Soft, nontender, nondistended, positive bowel sounds.  Musculoskeletal: No clubbing cyanosis or edema. Left lower extremity with some decreased erythema, with demarcation border just below the left knee. Several excoriating papular rash noted sparsely on lower extremities and also on bilateral upper extremities.  Data Reviewed: Basic Metabolic Panel:  Recent Labs Lab 09/03/14 1052 09/08/14 1336 09/09/14 0427  NA 136 138 135  K 5.5* 5.6* 4.7  CL 107 103 106  CO2 24 22 18*  GLUCOSE 169* 136* 202*  BUN 32* 40* 43*  CREATININE 1.40* 1.97* 1.96*  CALCIUM 8.5* 9.3 8.3*  MG  --  2.0  --    Liver Function Tests:  Recent Labs Lab 09/08/14 1336  AST  20  ALT 16*  ALKPHOS 124  BILITOT 0.6  PROT 7.9  ALBUMIN 4.4   No results for input(s):  LIPASE, AMYLASE in the last 168 hours. No results for input(s): AMMONIA in the last 168 hours. CBC:  Recent Labs Lab 09/03/14 1052 09/08/14 1336 09/09/14 0427  WBC 7.9 8.7 4.2  NEUTROABS 5.4 6.1 3.3  HGB 14.1 15.2 13.4  HCT 42.7 47.1 41.7  MCV 81.0 82.1 81.6  PLT 189 245 217   Cardiac Enzymes: No results for input(s): CKTOTAL, CKMB, CKMBINDEX, TROPONINI in the last 168 hours. BNP (last 3 results) No results for input(s): BNP in the last 8760 hours.  ProBNP (last 3 results) No results for input(s): PROBNP in the last 8760 hours.  CBG:  Recent Labs Lab 09/03/14 1240 09/08/14 1800 09/08/14 2159 09/09/14 0800  GLUCAP 126* 116* 214* 184*    Recent Results (from the past 240 hour(s))  Culture, Urine     Status: None (Preliminary result)   Collection Time: 09/08/14  8:11 PM  Result Value Ref Range Status   Specimen Description URINE, CLEAN CATCH  Final   Special Requests NONE  Final   Culture   Final    NO GROWTH < 12 HOURS Performed at [facility]    Report Status PENDING  Incomplete     Studies: Dg Ribs Unilateral W/chest Left  09/08/2014   CLINICAL DATA:  Left lower flank pain.  Fall today.  EXAM: LEFT RIBS AND CHEST - 3+ VIEW  COMPARISON:  11/27/2006  FINDINGS: Heart is upper limits normal in size. Tortuosity of the thoracic aorta with scattered calcifications. No confluent opacities in the lungs. Scarring or atelectasis at the left base. No visible rib fracture.  IMPRESSION: No visible rib fracture or pneumothorax.  Left base atelectasis or scarring.   Electronically Signed   By: Rolm Baptise M.D.   On: 09/08/2014 17:17    Scheduled Meds: . sodium chloride   Intravenous STAT  . buPROPion  100 mg Oral QPM  . diphenhydrAMINE  12.5 mg Oral TID  . docusate sodium  100 mg Oral Daily  . enoxaparin (LOVENOX) injection  30 mg Subcutaneous Q24H  . famotidine (PEPCID) IV  20 mg Intravenous Q24H  . insulin aspart  0-9 Units Subcutaneous TID WC  .  methylPREDNISolone (SOLU-MEDROL) injection  60 mg Intravenous Q24H  . multivitamin-lutein  1 capsule Oral Daily  . omega-3 acid ethyl esters  2 g Oral QODAY  . polyethylene glycol  17 g Oral Daily  . sodium chloride  3 mL Intravenous Q12H   Continuous Infusions: . sodium chloride 75 mL/hr at 09/09/14 4163    Principal Problem:   ARF (acute renal failure) Active Problems:   GERD   Diabetes mellitus   HTN (hypertension)   Hyperlipemia   Spinal stenosis   PTSD (post-traumatic stress disorder)   Murmur   Tobacco abuse   Chronic venous hypertension without complications   Aortic stenosis   Chronic constipation   Congestive dilated cardiomyopathy   Rash   Venous stasis dermatitis of left lower extremity   Dehydration   Allergic reaction caused by a drug    Time spent: 37 minutes    [facility] MD [facility] Pager [phone]. If 7PM-7AM, please contact night-coverage at [email or web address], password [facility] 09/09/2014, 11:00 AM  LOS: 1 day

## 2014-09-09 NOTE — Evaluation (Signed)
Physical Therapy Evaluation Patient Details Name: Antonio Buchanan MRN: [id number] DOB: March 16, 1922 Today's Date: 09/09/2014   History of Present Illness  Pt is a 81 year old male with history of diabetes mellitus, hypertension, hyperlipidemia, aortic stenosis, PTSD, diverticulosis, spinal stenosis and admitted for left lower extremity erythema/questionable cellulitis/questionable venous stasis dermatitis and acute renal failure  Clinical Impression  Pt admitted with above diagnosis. Pt currently with functional limitations due to the deficits listed below (see PT Problem List). Pt is very strong-willed and refuses using assistive devices despite fall just prior to admission.  Pt is likely close to his baseline so will follow in acute however do not anticipate f/u PT. Pt will benefit from skilled PT to increase their independence and safety with mobility to allow discharge home with his 29 year old spouse.      Follow Up Recommendations No PT follow up    Equipment Recommendations  None recommended by PT    Recommendations for Other Services       Precautions / Restrictions Precautions Precautions: Fall      Mobility  Bed Mobility Overal bed mobility: Modified Independent                Transfers Overall transfer level: Needs assistance Equipment used: None Transfers: Sit to/from Stand Sit to Stand: Min assist         General transfer comment: assist to steady upon rise  Ambulation/Gait Ambulation/Gait assistance: Min assist Ambulation Distance (Feet): 160 Feet Assistive device: None Gait Pattern/deviations: Step-through pattern;Decreased stride length     General Gait Details: initially requiring min assist in room to steady however progressed to min/guard, pt states this is his normal, refuses assistive device   Stairs            Wheelchair Mobility    Modified Rankin (Stroke Patients Only)       Balance Overall balance assessment:  History of Falls                                           Pertinent Vitals/Pain Pain Assessment: Faces Faces Pain Scale: Hurts little more Pain Location: L flank/pelvis/hip (from fall per pt) Pain Descriptors / Indicators: Sore Pain Intervention(s): Monitored during session;Limited activity within patient's tolerance (pt reports he does not like to take pain meds)    Home Living Family/patient expects to be discharged to:: Private residence Living Arrangements: Spouse/significant other   Type of Home: House Home Access: Stairs to enter       Home Equipment: Environmental consultant - 2 wheels;Cane - single point      Prior Function Level of Independence: Independent         Comments: refuses to use assistive device     Hand Dominance        Extremity/Trunk Assessment               Lower Extremity Assessment: Generalized weakness         Communication   Communication: HOH  Cognition Arousal/Alertness: Awake/alert Behavior During Therapy: WFL for tasks assessed/performed Overall Cognitive Status: Within Functional Limits for tasks assessed (appears strong-willed)                      General Comments      Exercises        Assessment/Plan    PT Assessment Patient needs continued PT services  PT  Diagnosis Difficulty walking   PT Problem List Decreased strength;Decreased activity tolerance;Decreased mobility;Decreased balance  PT Treatment Interventions DME instruction;Gait training;Stair training;Functional mobility training;Patient/family education;Therapeutic activities;Therapeutic exercise   PT Goals (Current goals can be found in the Care Plan section) Acute Rehab PT Goals PT Goal Formulation: With patient Time For Goal Achievement: 09/16/14 Potential to Achieve Goals: Good    Frequency Min 3X/week   Barriers to discharge        Co-evaluation               End of Session Equipment Utilized During Treatment: Gait  belt Activity Tolerance: Patient tolerated treatment well Patient left: in bed;with call bell/phone within reach;with bed alarm set Nurse Communication: Mobility status         Time: 6168-3729 PT Time Calculation (min) (ACUTE ONLY): 18 min   Charges:   PT Evaluation $Initial PT Evaluation Tier I: 1 Procedure     PT G Codes:        Bartlett Enke,KATHrine E 09/09/2014, 12:34 PM Carmelia Bake, PT, DPT 09/09/2014 Pager: [phone]

## 2014-09-10 DIAGNOSIS — R21 Rash and other nonspecific skin eruption: Secondary | ICD-10-CM

## 2014-09-10 DIAGNOSIS — E860 Dehydration: Secondary | ICD-10-CM

## 2014-09-10 DIAGNOSIS — Z889 Allergy status to unspecified drugs, medicaments and biological substances status: Secondary | ICD-10-CM

## 2014-09-10 DIAGNOSIS — I420 Dilated cardiomyopathy: Secondary | ICD-10-CM

## 2014-09-10 DIAGNOSIS — I8312 Varicose veins of left lower extremity with inflammation: Secondary | ICD-10-CM

## 2014-09-10 DIAGNOSIS — I10 Essential (primary) hypertension: Secondary | ICD-10-CM

## 2014-09-10 DIAGNOSIS — N170 Acute kidney failure with tubular necrosis: Secondary | ICD-10-CM

## 2014-09-10 LAB — CBC
HCT: 41.5 % (ref 39.0–52.0)
Hemoglobin: 13.3 g/dL (ref 13.0–17.0)
MCH: 26.4 pg (ref 26.0–34.0)
MCHC: 32 g/dL (ref 30.0–36.0)
MCV: 82.3 fL (ref 78.0–100.0)
Platelets: 228 10*3/uL (ref 150–400)
RBC: 5.04 MIL/uL (ref 4.22–5.81)
RDW: 16 % — AB (ref 11.5–15.5)
WBC: 7.2 10*3/uL (ref 4.0–10.5)

## 2014-09-10 LAB — BASIC METABOLIC PANEL
Anion gap: 9 (ref 5–15)
BUN: 41 mg/dL — ABNORMAL HIGH (ref 6–20)
CALCIUM: 8.6 mg/dL — AB (ref 8.9–10.3)
CO2: 19 mmol/L — ABNORMAL LOW (ref 22–32)
Chloride: 108 mmol/L (ref 101–111)
Creatinine, Ser: 1.84 mg/dL — ABNORMAL HIGH (ref 0.61–1.24)
GFR calc non Af Amer: 30 mL/min — ABNORMAL LOW (ref >60)
GFR, EST AFRICAN AMERICAN: 35 mL/min — AB (ref >60)
Glucose, Bld: 238 mg/dL — ABNORMAL HIGH (ref 65–99)
POTASSIUM: 4.9 mmol/L (ref 3.5–5.1)
SODIUM: 136 mmol/L (ref 135–145)

## 2014-09-10 LAB — URINE CULTURE: CULTURE: NO GROWTH

## 2014-09-10 LAB — GLUCOSE, CAPILLARY
GLUCOSE-CAPILLARY: 213 mg/dL — AB (ref 65–99)
Glucose-Capillary: 157 mg/dL — ABNORMAL HIGH (ref 65–99)
Glucose-Capillary: 196 mg/dL — ABNORMAL HIGH (ref 65–99)
Glucose-Capillary: 215 mg/dL — ABNORMAL HIGH (ref 65–99)

## 2014-09-10 MED ORDER — METOPROLOL TARTRATE 25 MG PO TABS
12.5000 mg | ORAL_TABLET | Freq: Two times a day (BID) | ORAL | Status: DC
  Administered 2014-09-10: 12.5 mg via ORAL
  Filled 2014-09-10 (×2): qty 1

## 2014-09-10 NOTE — Progress Notes (Signed)
Pt OT Cancellation Note  Patient Details Name: Antonio Buchanan MRN: [id number] DOB: 1921-07-16   Cancelled Treatment:     Pt not willing to work with OT at this time. Pt is willing to work with OT later today or next day.  Pt with decreased safety awareness of current situation.  Betsy Pries 09/10/2014, 10:56 AM

## 2014-09-10 NOTE — Progress Notes (Signed)
Called to pt room stating I can't catch my breathe. Pt somewhat anxious. Enc pt to relax, placed O2 2l on for support. Vss and HR SR/SB. ...will cont to monitor. SRP, RN

## 2014-09-10 NOTE — Progress Notes (Signed)
Pt had 4 beat run of Vtach around 1630. Notified by CMT at 1720. Paged provider. Will continue to monitor.

## 2014-09-10 NOTE — Care Management Note (Signed)
Case Management Note  Patient Details  Name: Norm Wray MRN: [id number] Date of Birth: [birth year]  Subjective/Objective:     ARF               Action/Plan:Plan to dc home with wife.   Expected Discharge Date:   (unknown)               Expected Discharge Plan:  Home/Self Care  In-House Referral:     Discharge planning Services  CM Consult  Post Acute Care Choice:    Choice offered to:     DME Arranged:    DME Agency:     HH Arranged:    [place] Agency:     Status of Service:     Medicare Important Message Given:  Yes Date Medicare IM Given:  09/10/14 Medicare IM give by:  m. Mikhala Kenan, RN, BSN Date Additional Medicare IM Given:    Additional Medicare Important Message give by:     If discussed at [place] of Stay Meetings, dates discussed:    Additional CommentsPurcell Mouton, RN 09/10/2014, 4:11 PM

## 2014-09-10 NOTE — Progress Notes (Signed)
[facility] PROGRESS NOTE  Antonio Buchanan MWU:[id number] DOB: 10/19/1921 DOA: 09/08/2014 PCP: Donnajean Lopes, MD  Assessment/Plan: #1 acute renal failure Questionable etiology. May be secondary to acute interstitial nephritis as eosinophils versus a prerenal azotemia versus post renal azotemia. Eosinophils were noted on CBC. Fractional excretion of sodium noted to be 2.82%. Possible ATN from recent abx - Renal ultrasound normal -Continue to hold antibiotics at this time. Continue gentle hydration at 75 mL per hour. - Cr has shown slight improvement overnight. - Have discussed case with Nephrology who recommends to continue current course of care for now.  #2 hyperkalemia -Likely secondary to problem #1 versus oral supplementation.  -corrected  #3 left lower extremity erythema/questionable cellulitis/questionable venous stasis dermatitis -Erythema slightly improved from admission however still erythematous with some edema.  -Patient had lower extremity Dopplers done to admission which were negative for DVT.  -Patient was seen by dermatologist, and initially placed on Keflex however this was consistent discontinued secondary to concern for probable allergic reaction.  -KOH was done which was negative.  -Patient only to follow-up with dermatology as outpatient.  #4 probable allergic reaction - suspect secondary to recent Keflex.  - Rash seems to be improving - Continue IV Pepcid, Benadryl, steroids.  #5 dehydration - Cont IV fluids as tolerated  #6 PTSD/depression/anxiety - Stable. Continue Wellbutrin.  #7 diabetes mellitus well-controlled - Hemoglobin A1c is 6.1. CBG 184 04/22/2012.  - Continue to hold oral hypoglycemic agents.  - Sliding scale insulin.  #8 hypertension - overall stable.  - Continue on Norvasc and metoprolol.  #9 Constipation - Continue current bowel regimen. - Pt notes improvement in constipation  #10 prophylaxis - Pepcid for GI  prophylaxis.  - Lovenox for DVT prophylaxis.  Code Status: DNR Family Communication: Pt in room, wife at bedside (indicate person spoken with, relationship, and if by phone, the number) Disposition Plan: pending   Consultants:    Procedures:    Antibiotics:   (indicate start date, and stop date if known)  HPI/Subjective: Eager to go home. No complaints. States rash seems improved  Objective: Filed Vitals:   09/10/14 0436 09/10/14 0652 09/10/14 1426 09/10/14 1620  BP: 141/69  157/69 101/65  Pulse: 54  67 82  Temp: 97.6 F (36.4 C)  97.5 F (36.4 C) 97.7 F (36.5 C)  TempSrc: Oral  Oral Oral  Resp: 18  18 18   Height:      Weight:  71.5 kg (157 lb 10.1 oz)    SpO2: 100%  99% 97%    Intake/Output Summary (Last 24 hours) at 09/10/14 1705 Last data filed at 09/10/14 1603  Gross per 24 hour  Intake   2085 ml  Output   2300 ml  Net   -215 ml   Filed Weights   09/08/14 1738 09/09/14 0524 09/10/14 0652  Weight: 71 kg (156 lb 8.4 oz) 71.215 kg (157 lb) 71.5 kg (157 lb 10.1 oz)    Exam:   General:  Awake, in nad  Cardiovascular: regular, s1, s2  Respiratory: normal resp effort, no wheezing  Abdomen: soft,nondistended  Musculoskeletal: perfused, no clubbing   Data Reviewed: Basic Metabolic Panel:  Recent Labs Lab 09/08/14 1336 09/09/14 0427 09/10/14 0432  NA 138 135 136  K 5.6* 4.7 4.9  CL 103 106 108  CO2 22 18* 19*  GLUCOSE 136* 202* 238*  BUN 40* 43* 41*  CREATININE 1.97* 1.96* 1.84*  CALCIUM 9.3 8.3* 8.6*  MG 2.0  --   --  Liver Function Tests:  Recent Labs Lab 09/08/14 1336  AST 20  ALT 16*  ALKPHOS 124  BILITOT 0.6  PROT 7.9  ALBUMIN 4.4   No results for input(s): LIPASE, AMYLASE in the last 168 hours. No results for input(s): AMMONIA in the last 168 hours. CBC:  Recent Labs Lab 09/08/14 1336 09/09/14 0427 09/10/14 0432  WBC 8.7 4.2 7.2  NEUTROABS 6.1 3.3  --   HGB 15.2 13.4 13.3  HCT 47.1 41.7 41.5  MCV 82.1 81.6  82.3  PLT 245 217 228   Cardiac Enzymes: No results for input(s): CKTOTAL, CKMB, CKMBINDEX, TROPONINI in the last 168 hours. BNP (last 3 results) No results for input(s): BNP in the last 8760 hours.  ProBNP (last 3 results) No results for input(s): PROBNP in the last 8760 hours.  CBG:  Recent Labs Lab 09/09/14 1140 09/09/14 1623 09/09/14 2110 09/10/14 0816 09/10/14 1143  GLUCAP 216* 170* 222* 157* 196*    Recent Results (from the past 240 hour(s))  Culture, Urine     Status: None   Collection Time: 09/08/14  8:11 PM  Result Value Ref Range Status   Specimen Description URINE, CLEAN CATCH  Final   Special Requests NONE  Final   Culture   Final    NO GROWTH 2 DAYS Performed at [facility]    Report Status 09/10/2014 FINAL  Final     Studies: Dg Ribs Unilateral W/chest Left  09/08/2014   CLINICAL DATA:  Left lower flank pain.  Fall today.  EXAM: LEFT RIBS AND CHEST - 3+ VIEW  COMPARISON:  11/27/2006  FINDINGS: Heart is upper limits normal in size. Tortuosity of the thoracic aorta with scattered calcifications. No confluent opacities in the lungs. Scarring or atelectasis at the left base. No visible rib fracture.  IMPRESSION: No visible rib fracture or pneumothorax.  Left base atelectasis or scarring.   Electronically Signed   By: Rolm Baptise M.D.   On: 09/08/2014 17:17   US Renal  09/10/2014   CLINICAL DATA:  Patient with acute renal failure.  EXAM: RENAL / URINARY TRACT ULTRASOUND COMPLETE  COMPARISON:  Abdominal ultrasound 10/28/2013  FINDINGS: Right Kidney:  Length: 9.9 cm. No hydronephrosis. Probable small cyst within the superior pole measuring 1.1 cm. Normal renal cortical thickness.  Left Kidney:  Length: 10.8 cm. No hydronephrosis. Multiple small hypoechoic lesions, incompletely characterized however potentially representing small cysts.  Bladder:  Appears normal for degree of bladder distention.  IMPRESSION: No hydronephrosis.   Electronically Signed   By: Lovey Newcomer M.D.   On: 09/10/2014 01:53    Scheduled Meds: . buPROPion  100 mg Oral QPM  . diphenhydrAMINE  12.5 mg Oral TID  . docusate sodium  100 mg Oral Daily  . enoxaparin (LOVENOX) injection  30 mg Subcutaneous Q24H  . famotidine  20 mg Oral Daily  . insulin aspart  0-9 Units Subcutaneous TID WC  . multivitamin-lutein  1 capsule Oral Daily  . omega-3 acid ethyl esters  2 g Oral QODAY  . polyethylene glycol  17 g Oral Daily  . predniSONE  60 mg Oral QAC breakfast  . sodium bicarbonate  650 mg Oral BID  . sodium chloride  3 mL Intravenous Q12H   Continuous Infusions: . sodium chloride 75 mL/hr at 09/10/14 1009    Principal Problem:   ARF (acute renal failure) Active Problems:   GERD   Diabetes mellitus   HTN (hypertension)   Hyperlipemia   Spinal stenosis  PTSD (post-traumatic stress disorder)   Murmur   Tobacco abuse   Chronic venous hypertension without complications   Aortic stenosis   Chronic constipation   Congestive dilated cardiomyopathy   Rash   Venous stasis dermatitis of left lower extremity   Dehydration   Allergic reaction caused by a drug    CHIU, [facility] Pager [phone]. If 7PM-7AM, please contact night-coverage at [email or web address], password [facility] 09/10/2014, 5:05 PM  LOS: 2 days

## 2014-09-10 NOTE — Progress Notes (Signed)
Pt resting now, HOB elevated, and O2 remain on pt for support. Will cont to monitor and check pt. SRP, RN

## 2014-09-11 ENCOUNTER — Inpatient Hospital Stay (HOSPITAL_COMMUNITY): Payer: Medicare Other

## 2014-09-11 DIAGNOSIS — F4310 Post-traumatic stress disorder, unspecified: Secondary | ICD-10-CM

## 2014-09-11 DIAGNOSIS — N179 Acute kidney failure, unspecified: Principal | ICD-10-CM

## 2014-09-11 DIAGNOSIS — I350 Nonrheumatic aortic (valve) stenosis: Secondary | ICD-10-CM

## 2014-09-11 DIAGNOSIS — J9601 Acute respiratory failure with hypoxia: Secondary | ICD-10-CM

## 2014-09-11 DIAGNOSIS — E872 Acidosis: Secondary | ICD-10-CM

## 2014-09-11 DIAGNOSIS — Z720 Tobacco use: Secondary | ICD-10-CM

## 2014-09-11 LAB — BLOOD GAS, ARTERIAL
Acid-base deficit: 8.6 mmol/L — ABNORMAL HIGH (ref 0.0–2.0)
Bicarbonate: 18.9 mEq/L — ABNORMAL LOW (ref 20.0–24.0)
Drawn by: 307971
O2 CONTENT: 2 L/min
O2 Saturation: 90.7 %
PCO2 ART: 47.5 mmHg — AB (ref 35.0–45.0)
Patient temperature: 98.6
TCO2: 17.3 mmol/L (ref 0–100)
pH, Arterial: 7.223 — ABNORMAL LOW (ref 7.350–7.450)
pO2, Arterial: 69.6 mmHg — ABNORMAL LOW (ref 80.0–100.0)

## 2014-09-11 LAB — BASIC METABOLIC PANEL
ANION GAP: 6 (ref 5–15)
BUN: 42 mg/dL — ABNORMAL HIGH (ref 6–20)
CHLORIDE: 108 mmol/L (ref 101–111)
CO2: 22 mmol/L (ref 22–32)
CREATININE: 1.56 mg/dL — AB (ref 0.61–1.24)
Calcium: 8.2 mg/dL — ABNORMAL LOW (ref 8.9–10.3)
GFR calc non Af Amer: 37 mL/min — ABNORMAL LOW (ref >60)
GFR, EST AFRICAN AMERICAN: 43 mL/min — AB (ref >60)
Glucose, Bld: 157 mg/dL — ABNORMAL HIGH (ref 65–99)
Potassium: 4.7 mmol/L (ref 3.5–5.1)
SODIUM: 136 mmol/L (ref 135–145)

## 2014-09-11 LAB — GLUCOSE, CAPILLARY
GLUCOSE-CAPILLARY: 178 mg/dL — AB (ref 65–99)
GLUCOSE-CAPILLARY: 306 mg/dL — AB (ref 65–99)
Glucose-Capillary: 119 mg/dL — ABNORMAL HIGH (ref 65–99)
Glucose-Capillary: 205 mg/dL — ABNORMAL HIGH (ref 65–99)

## 2014-09-11 LAB — LACTIC ACID, PLASMA
LACTIC ACID, VENOUS: 1.5 mmol/L (ref 0.5–2.0)
LACTIC ACID, VENOUS: 1.8 mmol/L (ref 0.5–2.0)

## 2014-09-11 LAB — D-DIMER, QUANTITATIVE (NOT AT ARMC): D DIMER QUANT: 1.65 ug{FEU}/mL — AB (ref 0.00–0.48)

## 2014-09-11 LAB — BRAIN NATRIURETIC PEPTIDE: B Natriuretic Peptide: 1219.6 pg/mL — ABNORMAL HIGH (ref 0.0–100.0)

## 2014-09-11 MED ORDER — ALPRAZOLAM 0.25 MG PO TABS
0.2500 mg | ORAL_TABLET | Freq: Two times a day (BID) | ORAL | Status: DC | PRN
  Administered 2014-09-11 – 2014-09-13 (×2): 0.25 mg via ORAL
  Filled 2014-09-11 (×2): qty 1

## 2014-09-11 MED ORDER — FUROSEMIDE 10 MG/ML IJ SOLN
40.0000 mg | Freq: Once | INTRAMUSCULAR | Status: AC
  Administered 2014-09-11: 40 mg via INTRAVENOUS
  Filled 2014-09-11: qty 4

## 2014-09-11 MED ORDER — IPRATROPIUM-ALBUTEROL 0.5-2.5 (3) MG/3ML IN SOLN
3.0000 mL | Freq: Four times a day (QID) | RESPIRATORY_TRACT | Status: DC
  Administered 2014-09-11 (×2): 3 mL via RESPIRATORY_TRACT
  Filled 2014-09-11 (×2): qty 3

## 2014-09-11 MED ORDER — TECHNETIUM TC 99M DIETHYLENETRIAME-PENTAACETIC ACID: 45.0000 | Freq: Once | INTRAVENOUS | Status: AC | PRN

## 2014-09-11 MED ORDER — IPRATROPIUM-ALBUTEROL 0.5-2.5 (3) MG/3ML IN SOLN: 3.0000 mL | RESPIRATORY_TRACT | Status: DC | PRN

## 2014-09-11 MED ORDER — INSULIN ASPART 100 UNIT/ML ~~LOC~~ SOLN
6.0000 [IU] | Freq: Once | SUBCUTANEOUS | Status: AC
  Administered 2014-09-11: 6 [IU] via SUBCUTANEOUS

## 2014-09-11 MED ORDER — TECHNETIUM TO 99M ALBUMIN AGGREGATED
6.0000 | Freq: Once | INTRAVENOUS | Status: AC | PRN
  Administered 2014-09-11: 6 via INTRAVENOUS

## 2014-09-11 MED ORDER — METOPROLOL TARTRATE 25 MG PO TABS: 12.5000 mg | ORAL_TABLET | Freq: Two times a day (BID) | ORAL | Status: DC

## 2014-09-11 MED ORDER — LORAZEPAM 2 MG/ML IJ SOLN
1.0000 mg | Freq: Once | INTRAMUSCULAR | Status: AC
  Administered 2014-09-11: 1 mg via INTRAVENOUS
  Filled 2014-09-11: qty 1

## 2014-09-11 NOTE — Progress Notes (Signed)
HS [id number]. Paged hospitalist to see if coverage was warranted since no HS order was written, but CBG have normally been lower. Will continue to monitor.

## 2014-09-11 NOTE — Progress Notes (Signed)
Pt resting without distress-- arouses easily, states feels better and symptons of shortness of breath has improved. SRP, RN

## 2014-09-11 NOTE — Progress Notes (Signed)
[facility] PROGRESS NOTE  Antonio Buchanan LFY:[id number] DOB: Jul 19, 1921 DOA: 09/08/2014 PCP: Donnajean Lopes, MD  Assessment/Plan: #1 acute renal failure Unclear etiology although it may be secondary to acute interstitial nephritis vs eosinophils versus a prerenal azotemia versus post renal azotemia. Eosinophils were noted on CBC. Fractional excretion of sodium noted to be 2.82%. Possible ATN from recent abx - Renal ultrasound normal -Continue to hold antibiotics at this time.  -Pt was initially continued on gentle hydration at 75 mL per hour. - Cr has shown slight improvement overnight. - Had discussed case with Nephrology who recommends to continue current course of care for now.  #2 hyperkalemia -Likely secondary to problem #1 versus oral supplementation.  -corrected  #3 left lower extremity erythema/questionable cellulitis/questionable venous stasis dermatitis -Erythema seems slightly improved from admission however still erythematous with some edema - does not look actively infected  -Patient had lower extremity Dopplers done to admission which were negative for DVT.  -Patient was seen by dermatologist, and initially placed on Keflex however this was consistent discontinued secondary to concern for probable allergic reaction.  -KOH was done which was negative.  -Patient only to follow-up with dermatology as outpatient.  #4 probable allergic reaction - suspect secondary to recent Keflex.  - Rash seems to be improving - Continue IV Pepcid, Benadryl, steroids.  #5 dehydration - Cont IV fluids as tolerated  #6 PTSD/depression/anxiety - Stable. Continue Wellbutrin.  #7 diabetes mellitus well-controlled - Hemoglobin A1c is 6.1. CBG 184 04/22/2012.  - Continue to hold oral hypoglycemic agents.  - Sliding scale insulin.  #8 hypertension - overall stable.  - Continue on Norvasc and metoprolol.  #9 Constipation - Continue current bowel regimen. - Pt notes  improvement in constipation  #10 prophylaxis - Pepcid for GI prophylaxis.  - Lovenox for DVT prophylaxis.  #11 Hypoxia - See overnight events - Pt noted to have increased sob at night - This am, pt again noted to have increased sob with tachypnea - ABG with pH of 7.2, mildly elevated pCO2. Question mixed metabolic/respiratory acidosis - CXR with evidence of pulm edema. Given lasix x 1 and IVF stopped. Will check 2d echo - D dimer elevated. VQ pending  #12 Acidosis, suspect respiratory and metabolic - On PO bicarbonate - Cont scheduled bronchodilators  Code Status: DNR Family Communication: Pt in room, wife at bedside Disposition Plan: pending   Consultants:    Procedures:    Antibiotics:    HPI/Subjective: Marked sob and panic attack today.  Objective: Filed Vitals:   09/11/14 0551 09/11/14 1045 09/11/14 1400 09/11/14 1459  BP: 136/70 175/80 150/86   Pulse: 48 59 51   Temp: 97.6 F (36.4 C) 98 F (36.7 C) 97.6 F (36.4 C)   TempSrc: Oral Oral Oral   Resp: 17 20 20    Height:      Weight: 73.8 kg (162 lb 11.2 oz)     SpO2: 100% 99% 100% 97%    Intake/Output Summary (Last 24 hours) at 09/11/14 1735 Last data filed at 09/11/14 0845  Gross per 24 hour  Intake    660 ml  Output    950 ml  Net   -290 ml   Filed Weights   09/09/14 0524 09/10/14 0652 09/11/14 0551  Weight: 71.215 kg (157 lb) 71.5 kg (157 lb 10.1 oz) 73.8 kg (162 lb 11.2 oz)    Exam:   General:  Awake, sitting upright, in mild resp distress  Cardiovascular: regular, s1, s2  Respiratory: increased resp effort, coarse  BS  Abdomen: soft,nondistended  Musculoskeletal: perfused, no clubbing   Data Reviewed: Basic Metabolic Panel:  Recent Labs Lab 09/08/14 1336 09/09/14 0427 09/10/14 0432 09/11/14 0450  NA 138 135 136 136  K 5.6* 4.7 4.9 4.7  CL 103 106 108 108  CO2 22 18* 19* 22  GLUCOSE 136* 202* 238* 157*  BUN 40* 43* 41* 42*  CREATININE 1.97* 1.96* 1.84* 1.56*   CALCIUM 9.3 8.3* 8.6* 8.2*  MG 2.0  --   --   --    Liver Function Tests:  Recent Labs Lab 09/08/14 1336  AST 20  ALT 16*  ALKPHOS 124  BILITOT 0.6  PROT 7.9  ALBUMIN 4.4   No results for input(s): LIPASE, AMYLASE in the last 168 hours. No results for input(s): AMMONIA in the last 168 hours. CBC:  Recent Labs Lab 09/08/14 1336 09/09/14 0427 09/10/14 0432  WBC 8.7 4.2 7.2  NEUTROABS 6.1 3.3  --   HGB 15.2 13.4 13.3  HCT 47.1 41.7 41.5  MCV 82.1 81.6 82.3  PLT 245 217 228   Cardiac Enzymes: No results for input(s): CKTOTAL, CKMB, CKMBINDEX, TROPONINI in the last 168 hours. BNP (last 3 results) No results for input(s): BNP in the last 8760 hours.  ProBNP (last 3 results) No results for input(s): PROBNP in the last 8760 hours.  CBG:  Recent Labs Lab 09/10/14 1725 09/10/14 2133 09/11/14 0732 09/11/14 1206 09/11/14 1603  GLUCAP 213* 215* 178* 119* 205*    Recent Results (from the past 240 hour(s))  Culture, Urine     Status: None   Collection Time: 09/08/14  8:11 PM  Result Value Ref Range Status   Specimen Description URINE, CLEAN CATCH  Final   Special Requests NONE  Final   Culture   Final    NO GROWTH 2 DAYS Performed at [facility]    Report Status 09/10/2014 FINAL  Final     Studies: US Renal  09/10/2014   CLINICAL DATA:  Patient with acute renal failure.  EXAM: RENAL / URINARY TRACT ULTRASOUND COMPLETE  COMPARISON:  Abdominal ultrasound 10/28/2013  FINDINGS: Right Kidney:  Length: 9.9 cm. No hydronephrosis. Probable small cyst within the superior pole measuring 1.1 cm. Normal renal cortical thickness.  Left Kidney:  Length: 10.8 cm. No hydronephrosis. Multiple small hypoechoic lesions, incompletely characterized however potentially representing small cysts.  Bladder:  Appears normal for degree of bladder distention.  IMPRESSION: No hydronephrosis.   Electronically Signed   By: Lovey Newcomer M.D.   On: 09/10/2014 01:53   Dg Chest Port 1  View  09/11/2014   CLINICAL DATA:  Short of breath.  EXAM: PORTABLE CHEST - 1 VIEW  COMPARISON:  09/08/2014  FINDINGS: There is cardiac enlargement noted. Aortic atherosclerosis identified. Increase lung volumes with chronic coarsened interstitial markings. There is moderate superimposed pulmonary edema.  IMPRESSION: 1. Pulmonary edema 2. Aortic atherosclerosis.   Electronically Signed   By: Kerby Moors M.D.   On: 09/11/2014 13:26    Scheduled Meds: . buPROPion  100 mg Oral QPM  . diphenhydrAMINE  12.5 mg Oral TID  . docusate sodium  100 mg Oral Daily  . enoxaparin (LOVENOX) injection  30 mg Subcutaneous Q24H  . famotidine  20 mg Oral Daily  . insulin aspart  0-9 Units Subcutaneous TID WC  . ipratropium-albuterol  3 mL Nebulization Q6H  . metoprolol tartrate  12.5 mg Oral BID  . multivitamin-lutein  1 capsule Oral Daily  . omega-3 acid ethyl  esters  2 g Oral QODAY  . polyethylene glycol  17 g Oral Daily  . sodium chloride  3 mL Intravenous Q12H   Continuous Infusions:    Principal Problem:   ARF (acute renal failure) Active Problems:   GERD   Diabetes mellitus   HTN (hypertension)   Hyperlipemia   Spinal stenosis   PTSD (post-traumatic stress disorder)   Murmur   Tobacco abuse   Chronic venous hypertension without complications   Aortic stenosis   Chronic constipation   Congestive dilated cardiomyopathy   Rash   Venous stasis dermatitis of left lower extremity   Dehydration   Allergic reaction caused by a drug    CHIU, [facility] Pager [phone]. If 7PM-7AM, please contact night-coverage at [email or web address], password [facility] 09/11/2014, 5:35 PM  LOS: 3 days

## 2014-09-11 NOTE — Progress Notes (Addendum)
This morning pt started complaining of shortness of breath and stated that he felt like he was a panic attack. VSS, o2 at 99%.   .25mg  of xanax given, pt did not have any relief of anxiety. MD made aware, 1 mg of ativan ordered.  Helped pt practice breathing exercizes to get breathing under control.  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  After ativan was given pt was able to relax and eventually took a nap.   DG of chest, blood gas, and D-dimer preformed due to sudden onset of SOB.  Will continue to monitor closely.

## 2014-09-11 NOTE — Progress Notes (Signed)
PT Cancellation Note  Patient Details Name: Antonio Buchanan MRN: [id number] DOB: 10-16-1921   Cancelled Treatment:    Reason Eval/Treat Not Completed: Medical issues which prohibited therapy RN reports pt having increased anxiety at this time and requested PT not to work with pt today.   Jaelin Fackler,KATHrine E 09/11/2014, 2:14 PM Carmelia Bake, PT, DPT 09/11/2014 Pager: [phone]

## 2014-09-11 NOTE — Evaluation (Signed)
Occupational Therapy Evaluation Patient Details Name: Antonio Buchanan MRN: [id number] DOB: [birth year] Today's Date: 09/11/2014    History of Present Illness Pt is a 82 year old male with history of diabetes mellitus, hypertension, hyperlipidemia, aortic stenosis, PTSD, diverticulosis, spinal stenosis and admitted for left lower extremity erythema/questionable cellulitis/questionable venous stasis dermatitis and acute renal failure   Clinical Impression   Pt admitted with LLE erythema. Pt currently with functional limitations due to the deficits listed below (see OT Problem List).  Pt will benefit from skilled OT to increase their safety and independence with ADL and functional mobility for ADL to facilitate discharge to venue listed below.      Follow Up Recommendations  Home health OT    Equipment Recommendations  None recommended by OT       Precautions / Restrictions Precautions Precautions: Fall      Mobility Bed Mobility Overal bed mobility: Modified Independent                Transfers Overall transfer level: Needs assistance Equipment used: None   Sit to Stand: Min guard         General transfer comment: VCfor safety    Balance Overall balance assessment: History of Falls                                          ADL Overall ADL's : Needs assistance/impaired                         Toilet Transfer: Minimal assistance;Cueing for safety;Cueing for sequencing;Stand-pivot Toilet Transfer Details (indicate cue type and reason): bed to chair. Encouraged pt to put legs up as BLe with edema           General ADL Comments: Pt having a panic attack. SAt 97, HR 88, RN aware and MD aware.                 Pertinent Vitals/Pain Pain Location: did not complain of pain.  only anxiety. pt having a panic attack        Extremity/Trunk Assessment Upper Extremity Assessment Upper Extremity Assessment: Generalized  weakness           Communication Communication Communication: HOH   Cognition Arousal/Alertness: Awake/alert Behavior During Therapy: WFL for tasks assessed/performed Overall Cognitive Status: Within Functional Limits for tasks assessed (appears strong-willed)                     General Comments   pt limited today by panic attack. These attacks are long standing with pt per wife and pt. Encouraged relaxation and deep breathing            Home Living Family/patient expects to be discharged to:: Private residence Living Arrangements: Spouse/significant other   Type of Home: House Home Access: Stairs to enter     Home Layout: One level Alternate Level Stairs-Number of Steps: reports one step into sunroom (which is where he fell prior to admission)   Bathroom Shower/Tub: Occupational psychologist: Standard     Home Equipment: Environmental consultant - 2 wheels;Cane - single point          Prior Functioning/Environment Level of Independence: Independent        Comments: refuses to use assistive device    OT Diagnosis: Generalized weakness   OT Problem List: Decreased strength;Decreased  activity tolerance;Impaired balance (sitting and/or standing)   OT Treatment/Interventions: Self-care/ADL training;DME and/or AE instruction;Patient/family education    OT Goals(Current goals can be found in the care plan section) Acute Rehab OT Goals Patient Stated Goal: did not state  OT Frequency: Min 2X/week   Barriers to D/C:               End of Session Nurse Communication: Mobility status  Activity Tolerance: Patient tolerated treatment well Patient left: in chair   Time: 1220-1235 OT Time Calculation (min): 15 min Charges:  OT General Charges $OT Visit: 1 Procedure OT Evaluation $Initial OT Evaluation Tier I: 1 Procedure G-Codes:    Payton Mccallum D October 09, 2014, 1:11 PM

## 2014-09-12 ENCOUNTER — Inpatient Hospital Stay (HOSPITAL_COMMUNITY): Payer: Medicare Other

## 2014-09-12 ENCOUNTER — Encounter (HOSPITAL_COMMUNITY): Payer: Self-pay | Admitting: Cardiology

## 2014-09-12 DIAGNOSIS — K5900 Constipation, unspecified: Secondary | ICD-10-CM

## 2014-09-12 DIAGNOSIS — I509 Heart failure, unspecified: Secondary | ICD-10-CM

## 2014-09-12 DIAGNOSIS — R001 Bradycardia, unspecified: Secondary | ICD-10-CM

## 2014-09-12 DIAGNOSIS — J810 Acute pulmonary edema: Secondary | ICD-10-CM

## 2014-09-12 LAB — BASIC METABOLIC PANEL
Anion gap: 11 (ref 5–15)
BUN: 49 mg/dL — ABNORMAL HIGH (ref 6–20)
CALCIUM: 8.7 mg/dL — AB (ref 8.9–10.3)
CO2: 23 mmol/L (ref 22–32)
CREATININE: 1.52 mg/dL — AB (ref 0.61–1.24)
Chloride: 105 mmol/L (ref 101–111)
GFR calc Af Amer: 44 mL/min — ABNORMAL LOW (ref >60)
GFR calc non Af Amer: 38 mL/min — ABNORMAL LOW (ref >60)
GLUCOSE: 113 mg/dL — AB (ref 65–99)
Potassium: 4.3 mmol/L (ref 3.5–5.1)
Sodium: 139 mmol/L (ref 135–145)

## 2014-09-12 LAB — GLUCOSE, CAPILLARY
GLUCOSE-CAPILLARY: 103 mg/dL — AB (ref 65–99)
GLUCOSE-CAPILLARY: 160 mg/dL — AB (ref 65–99)
Glucose-Capillary: 142 mg/dL — ABNORMAL HIGH (ref 65–99)
Glucose-Capillary: 184 mg/dL — ABNORMAL HIGH (ref 65–99)

## 2014-09-12 LAB — TROPONIN I
Troponin I: 0.03 ng/mL (ref <0.031)
Troponin I: 0.04 ng/mL — ABNORMAL HIGH (ref <0.031)

## 2014-09-12 MED ORDER — CALCIUM CARBONATE ANTACID 500 MG PO CHEW
1.0000 | CHEWABLE_TABLET | Freq: Three times a day (TID) | ORAL | Status: DC | PRN
  Administered 2014-09-12: 200 mg via ORAL
  Filled 2014-09-12: qty 1

## 2014-09-12 MED ORDER — AMLODIPINE BESYLATE 5 MG PO TABS
5.0000 mg | ORAL_TABLET | Freq: Every day | ORAL | Status: DC
  Administered 2014-09-12 – 2014-09-13 (×2): 5 mg via ORAL
  Filled 2014-09-12 (×2): qty 1

## 2014-09-12 NOTE — Progress Notes (Signed)
[facility] PROGRESS NOTE  Antonio Buchanan RKY:[id number] DOB: 1921-06-24 DOA: 09/08/2014 PCP: Donnajean Lopes, MD  Assessment/Plan: #1 acute renal failure Unclear etiology although it may be secondary to acute interstitial nephritis vs eosinophils versus a prerenal azotemia versus post renal azotemia. Eosinophils were noted on CBC. Fractional excretion of sodium noted to be 2.82%. Possible ATN from recent abx - Renal ultrasound was normal - Continue to hold antibiotics at this time.  - Pt was initially continued on gentle hydration at 75 mL per hour. - Cr has shown slight improvement overnight. - Had discussed case with Nephrology who recommends to continue current course of care for now.  #2 hyperkalemia -Likely secondary to problem #1 versus oral supplementation.  -corrected  #3 left lower extremity erythema/questionable cellulitis/questionable venous stasis dermatitis -Erythema seems slightly improved from admission however still erythematous with some edema - does not look actively infected  -Patient had lower extremity Dopplers done to admission which were negative for DVT.   -Patient was seen by dermatologist, and initially placed on Keflex however this was consistent discontinued secondary to concern for probable allergic reaction.  -KOH was done which was negative.  -Patient to follow-up with dermatology as outpatient.  #4 probable allergic reaction - suspect secondary to recent Keflex.  - Rash seems to be improving - Continue IV Pepcid, Benadryl, steroids.  #5 dehydration - Cont IV fluids as tolerated  #6 PTSD/depression/anxiety - Stable. Continue Wellbutrin.  #7 diabetes mellitus well-controlled - Hemoglobin A1c is 6.1. CBG 184 04/22/2012.  - Continue to hold oral hypoglycemic agents.  - Sliding scale insulin.  #8 hypertension - overall stable.  - Continue on Norvasc and metoprolol.  #9 Constipation - Continue current bowel regimen. - Pt notes  improvement in constipation  #10 prophylaxis - Pepcid for GI prophylaxis.  - Lovenox for DVT prophylaxis.  #11 Recent Hypoxia - On 6/23, pt noted to have increased sob with tachypnea - ABG with pH of 7.2, mildly elevated pCO2. Question mixed metabolic/respiratory acidosis - CXR with evidence of pulm edema. Given lasix x 1 and IVF stopped. Will check 2d echo - D dimer was elevated but VQ neg for PE - Much improved - 2d echo results pending  #12 Acidosis, suspect respiratory and metabolic - On PO bicarbonate - Cont scheduled bronchodilators  #13 Aortic Stenosis - Appreciate input by Cardiology - Recs to hold beta blockers, continue on 10mg  of lotensin on discharge - Pt to follow up closely with Cardiology for consideration for TAVR  Code Status: DNR Family Communication: Pt in room Disposition Plan: pending   Consultants: Cardiology  Procedures:    Antibiotics:    HPI/Subjective: Feels better today  Objective: Filed Vitals:   09/11/14 2037 09/11/14 2158 09/12/14 0450 09/12/14 1325  BP:  125/86 152/66 145/75  Pulse:  62 53 61  Temp:  97.6 F (36.4 C) 97.7 F (36.5 C) 97.5 F (36.4 C)  TempSrc:  Oral Oral Oral  Resp:  20 20 12   Height:      Weight:   71.6 kg (157 lb 13.6 oz)   SpO2: 96% 98% 100% 97%    Intake/Output Summary (Last 24 hours) at 09/12/14 1537 Last data filed at 09/12/14 1336  Gross per 24 hour  Intake    640 ml  Output   2245 ml  Net  -1605 ml   Filed Weights   09/10/14 0652 09/11/14 0551 09/12/14 0450  Weight: 71.5 kg (157 lb 10.1 oz) 73.8 kg (162 lb 11.2 oz) 71.6 kg (157  lb 13.6 oz)    Exam:   General:  Awake, sitting upright, in mild resp distress  Cardiovascular: regular, s1, s2  Respiratory: increased resp effort, coarse BS  Abdomen: soft,nondistended  Musculoskeletal: perfused, no clubbing   Data Reviewed: Basic Metabolic Panel:  Recent Labs Lab 09/08/14 1336 09/09/14 0427 09/10/14 0432 09/11/14 0450  09/12/14 0527  NA 138 135 136 136 139  K 5.6* 4.7 4.9 4.7 4.3  CL 103 106 108 108 105  CO2 22 18* 19* 22 23  GLUCOSE 136* 202* 238* 157* 113*  BUN 40* 43* 41* 42* 49*  CREATININE 1.97* 1.96* 1.84* 1.56* 1.52*  CALCIUM 9.3 8.3* 8.6* 8.2* 8.7*  MG 2.0  --   --   --   --    Liver Function Tests:  Recent Labs Lab 09/08/14 1336  AST 20  ALT 16*  ALKPHOS 124  BILITOT 0.6  PROT 7.9  ALBUMIN 4.4   No results for input(s): LIPASE, AMYLASE in the last 168 hours. No results for input(s): AMMONIA in the last 168 hours. CBC:  Recent Labs Lab 09/08/14 1336 09/09/14 0427 09/10/14 0432  WBC 8.7 4.2 7.2  NEUTROABS 6.1 3.3  --   HGB 15.2 13.4 13.3  HCT 47.1 41.7 41.5  MCV 82.1 81.6 82.3  PLT 245 217 228   Cardiac Enzymes:  Recent Labs Lab 09/12/14 0830  TROPONINI 0.03   BNP (last 3 results)  Recent Labs  09/11/14 1430  BNP 1219.6*    ProBNP (last 3 results) No results for input(s): PROBNP in the last 8760 hours.  CBG:  Recent Labs Lab 09/11/14 1206 09/11/14 1603 09/11/14 2153 09/12/14 0811 09/12/14 1304  GLUCAP 119* 205* 306* 103* 142*    Recent Results (from the past 240 hour(s))  Culture, Urine     Status: None   Collection Time: 09/08/14  8:11 PM  Result Value Ref Range Status   Specimen Description URINE, CLEAN CATCH  Final   Special Requests NONE  Final   Culture   Final    NO GROWTH 2 DAYS Performed at [facility]    Report Status 09/10/2014 FINAL  Final     Studies: Ct Abdomen Pelvis Wo Contrast  09/12/2014   CLINICAL DATA:  Left upper quadrant pain, trauma to left upper quadrant, possible splenic injury  EXAM: CT ABDOMEN AND PELVIS WITHOUT CONTRAST  TECHNIQUE: Multidetector CT imaging of the abdomen and pelvis was performed following the standard protocol without IV contrast.  COMPARISON:  Ultrasound of the abdomen 10/28/2013  FINDINGS: There is small bilateral pleural effusion with bilateral basilar posterior atelectasis. There is  mild displaced acute fracture of the left posterior tenth rib please see axial image 24.  Atherosclerotic calcifications of mitral valve and thoracic aorta. Coronary arteries calcifications.  Unenhanced liver shows no biliary ductal dilatation. Again noted cystic lesion in right hepatic lobe anteriorly measures 4.7 by 4 cm. On the priors ultrasound measures 4 cm. Unenhanced pancreas, spleen and adrenal glands are unremarkable. There is no evidence of splenic laceration. No perisplenic fluid or ascites. Atherosclerotic calcifications of abdominal aorta and iliac arteries. At least 2 nonobstructive calcifications are noted in lower pole of the right kidney the largest measures 2.5 mm.  No abdominal aortic aneurysm.  No hydronephrosis or hydroureter.  There is a cyst in midpole of the left kidney measures 1 cm.  No calcified gallstones are noted within gallbladder.  No pericecal inflammation. The terminal ileum is unremarkable. Moderate stool noted in right colon  and transverse colon. Colonic diverticula are noted in left colon. There is a tortuous proximal sigmoid colon. Moderate gas is noted in proximal sigmoid colon. Sigmoid colon diverticula. No evidence of acute diverticulitis. Abundant stool distal sigmoid colon. Measures 6 cm in diameter. There is abundant stool in the rectum measures 8.3 cm in diameter highly suspicious for fecal impaction. Degenerative changes pubic symphysis. There is a second cyst in lower pole of the left kidney measures 1.3 cm.  There is interval moderate compression fracture lower endplate of L1 vertebral body probable chronic in nature. Degenerative changes lumbar spine.  IMPRESSION: 1. Bilateral small pleural effusion with bilateral basilar posterior atelectasis or infiltrate. 2. There is mild displaced fracture of the left posterior tenth rib. 3. No splenic laceration.  No perisplenic fluid. 4. Again noted cystic lesion in right hepatic lobe anteriorly measures 4.7 cm. On the prior  exam measures 4.1 x 4 cm. 5. Left renal cysts are noted. 6. Atherosclerotic calcifications of abdominal aorta and iliac arteries. 7. Colonic diverticulosis. No evidence of acute diverticulitis. No pericecal inflammation. 8. There is tortuous proximal sigmoid colon moderate distended with gas. Abundant stool is noted in distal sigmoid colon. Significant stool noted within rectum which measures 8.3 cm in diameter. Findings highly suspicious for fecal impaction. 9. Degenerative changes pubic symphysis. There is interval moderate compression fracture of lower endplate of L1 vertebral body probable chronic in nature. 10. Left nonobstructive nephrolithiasis. No hydronephrosis or hydroureter.   Electronically Signed   By: Lahoma Crocker M.D.   On: 09/12/2014 12:08   Nm Pulmonary Perf And Vent  09/11/2014   CLINICAL DATA:  Acute shortness of breath.  EXAM: NUCLEAR MEDICINE VENTILATION - PERFUSION LUNG SCAN  TECHNIQUE: Ventilation images were obtained in multiple projections using inhaled aerosol Tc-81m DTPA. Perfusion images were obtained in multiple projections after intravenous injection of Tc-45m MAA.  RADIOPHARMACEUTICALS:  45.0 Technetium-30m DTPA aerosol inhalation and 6.0 Technetium-55m MAA IV  COMPARISON:  Chest x-ray 09/11/2014  FINDINGS: Ventilation: Patchy heterogeneous ventilation scan but no segmental ventilation defects.  Perfusion: No segmental or subsegmental perfusion defects to suggest pulmonary embolism.  IMPRESSION: Negative ventilation perfusion lung scan for pulmonary embolism.   Electronically Signed   By: Marijo Sanes M.D.   On: 09/11/2014 18:43   Dg Chest Port 1 View  09/11/2014   CLINICAL DATA:  Short of breath.  EXAM: PORTABLE CHEST - 1 VIEW  COMPARISON:  09/08/2014  FINDINGS: There is cardiac enlargement noted. Aortic atherosclerosis identified. Increase lung volumes with chronic coarsened interstitial markings. There is moderate superimposed pulmonary edema.  IMPRESSION: 1. Pulmonary edema 2.  Aortic atherosclerosis.   Electronically Signed   By: Kerby Moors M.D.   On: 09/11/2014 13:26    Scheduled Meds: . amLODipine  5 mg Oral Daily  . buPROPion  100 mg Oral QPM  . diphenhydrAMINE  12.5 mg Oral TID  . docusate sodium  100 mg Oral Daily  . enoxaparin (LOVENOX) injection  30 mg Subcutaneous Q24H  . famotidine  20 mg Oral Daily  . insulin aspart  0-9 Units Subcutaneous TID WC  . multivitamin-lutein  1 capsule Oral Daily  . omega-3 acid ethyl esters  2 g Oral QODAY  . polyethylene glycol  17 g Oral Daily  . sodium chloride  3 mL Intravenous Q12H   Continuous Infusions:    Principal Problem:   ARF (acute renal failure) Active Problems:   GERD   Diabetes mellitus   HTN (hypertension)   Hyperlipemia  Spinal stenosis   PTSD (post-traumatic stress disorder)   Murmur   Tobacco abuse   Chronic venous hypertension without complications   Aortic stenosis   Chronic constipation   Congestive dilated cardiomyopathy   Rash   Venous stasis dermatitis of left lower extremity   Dehydration   Allergic reaction caused by a drug   CH[facility], [facility] Pager [phone]. If 7PM-7AM, please contact night-coverage at [email or web address], password [facility] 09/12/2014, 3:37 PM  LOS: 4 days

## 2014-09-12 NOTE — Progress Notes (Signed)
OT Cancellation Note  Patient Details Name: Antonio Buchanan MRN: [id number] DOB: [birth year]   Cancelled Treatment:    Reason Eval/Treat Not Completed: Patient at procedure or test/ unavailable;Other (comment):  Going to CT  [facility] 09/12/2014, 10:54 AM  Lesle Chris, OTR/L [phone] 09/12/2014

## 2014-09-12 NOTE — Progress Notes (Signed)
Physical Therapy Treatment Patient Details Name: Antonio Buchanan MRN: [id number] DOB: 1921/12/28 Today's Date: September 28, 2014    History of Present Illness Pt is a 81 year old male with history of diabetes mellitus, hypertension, hyperlipidemia, aortic stenosis, PTSD, diverticulosis, spinal stenosis and admitted for left lower extremity erythema/questionable cellulitis/questionable venous stasis dermatitis and acute renal failure    PT Comments    Pt ambulated around entire unit, no LOB however slightly unsteady.  Pt does not wish to use any assistive devices.  Follow Up Recommendations  No PT follow up     Equipment Recommendations  None recommended by PT    Recommendations for Other Services       Precautions / Restrictions Precautions Precautions: Fall    Mobility  Bed Mobility Overal bed mobility: Modified Independent                Transfers Overall transfer level: Needs assistance Equipment used: None Transfers: Sit to/from Stand Sit to Stand: Min guard         General transfer comment: min/guard for safety, appears more steady today  Ambulation/Gait Ambulation/Gait assistance: Min guard;Supervision Ambulation Distance (Feet): 400 Feet Assistive device: None Gait Pattern/deviations: Step-through pattern;Decreased stride length     General Gait Details: slow gait, no LOB, slightly unsteady at times but no physical assist required   Stairs            Wheelchair Mobility    Modified Rankin (Stroke Patients Only)       Balance                                    Cognition Arousal/Alertness: Awake/alert Behavior During Therapy: WFL for tasks assessed/performed Overall Cognitive Status: Within Functional Limits for tasks assessed (appears strong-willed)                      Exercises      General Comments        Pertinent Vitals/Pain Pain Assessment: Faces Faces Pain Scale: Hurts little more Pain  Location: L flank, states bruising Pain Descriptors / Indicators: Sore;Guarding Pain Intervention(s): Limited activity within patient's tolerance;Monitored during session    Home Living                      Prior Function            PT Goals (current goals can now be found in the care plan section) Progress towards PT goals: Progressing toward goals    Frequency  Min 3X/week    PT Plan Current plan remains appropriate    Co-evaluation             End of Session   Activity Tolerance: Patient tolerated treatment well Patient left: in bed;with call bell/phone within reach;Other (comment) (about to go for CT)     Time: 1030-1053 PT Time Calculation (min) (ACUTE ONLY): 23 min  Charges:  $Gait Training: 8-22 mins                    G Codes:      Antonio Buchanan,KATHrine E 09/28/14, 1:23 PM Carmelia Bake, PT, DPT 2014-09-28 Pager: [phone]

## 2014-09-12 NOTE — Consult Note (Signed)
Reason for Consult: acute pul edema, sob    Referring Physician: Dr Wyline Copas   PCP:  Donnajean Lopes, MD  Primary Cardiologist:Dr. Ulysees Robarts [name] is an 81 y.o. male.    Chief Complaint: pt admitted 09/08/14  With presentation for rash, now developed hypoxia during the night with neg VQ scan but + pul. Edema on CXR.   HPI: Antonio Buchanan is a most interesting and delightful  81 y.o. male with history of diabetes mellitus, hypertension, hyperlipidemia, aortic stenosis, PTSD, diverticulosis who presents to the ED with worsening rash of his left lower extremity. Patient stated that approximately 2 weeks prior to admission he developed a cellulitis of his left ankle saw his PCP were placed him on doxycycline and finish a ten-day course. Patient stated that cellulitis improved. Patient states however once he was off the antibiotics his cellulitis had worsened. Patient stated he subsequently ended up seeing a dermatologist who did studies on his leg including a KOH prep and some scrapings and place patient on Keflex.  He developed a rash and presented to ER.  Thought to be allergic reaction, also found with elevated Cr. 1.97 and his baseline is 1.4.  Given IV fluids and his ACE was stopped.   He was +1615 of fluids on the 22nd.   ( this cellulitis occurs periodically since injury in the 80s)   During the night of the 22nd developed SOB   D-dimer elevated and VQ negative for PE.  CXR + pul edema. BNP 1219.  Echo ordered. Pt believes this was a panic attack, he could not stop crying and hyperventilating.  He denies any chest pain or SOB.  He is active and is walking around room here in the hospital without problems. No lightheadedness no syncope.    Followed by Dr Stanford Breed for AS. Myoview in 2008 showed EF 59 and normal perfusion. Dobutamine echocardiogram in February 2015 was felt consistent with severe aortic stenosis; low normal LV function. Echocardiogram  repeated in June of 2015. Ejection fraction 50-55% with moderate aortic stenosis and a mean gradient of 20 mm of mercury; mild biatrial enlargement. FU echo 6/15 showed EF 28-31, grade 2 diastolic dysfunction, moderate to severe AS (mean gradient 21 mmHg, AVA 1 cm2, mild MR, mild LAE.  However his LV function is now reduced and his aortic stenosis may be underestimated. Visually it appears to be severe. We talked about proceeding with TAVR evaluation to include initially a transesophageal echocardiogram to better assess aortic valve morphology and R and L cardiac catheterization to rule out coronary disease and better assess aortic stenosis. He wanted to consider this before making a final decision and discuss with his family.  Today pt has more questions and is considering TAVR.    Echo done 08/21/14: Study Conclusions - Left ventricle: The cavity size was normal. Wall thickness was increased in a pattern of mild LVH. Systolic function was moderately to severely reduced. The estimated ejection fraction was in the range of 30% to 35%. Diffuse hypokinesis. There is akinesis of the inferior myocardium. Features are consistent with a pseudonormal left ventricular filling pattern, with concomitant abnormal relaxation and increased filling pressure (grade 2 diastolic dysfunction). Doppler parameters are consistent with high ventricular filling pressure. - Aortic valve: Valve mobility was restricted. There was moderate to severe stenosis. There was trivial regurgitation. Valve area (VTI): 1.07 cm^2. Valve area (Vmax): 1.03 cm^2. Valve area (Vmean): 1.02 cm^2. - Mitral valve: Moderately calcified annulus.  There was mild regurgitation. Valve area by pressure half-time: 2 cm^2. Valve area by continuity equation (using LVOT flow): 1.55 cm^2. - Left atrium: The atrium was mildly dilated. - Pulmonary arteries: Systolic pressure was mildly increased. PA peak pressure: 33 mm Hg  (S). Impressions: - Moderate to severe LV dysfunction with global hypokinesis and inferior akinesis; grade 2 diastolic dysfunction with elevated LV filling pressure; mild LAE; severely calcified aortic valve with reduced cusp excursion; moderate to severe AS (appears severe morphologically); trace AI; mild MR; mild TR with mildly elevated pulmonary pressure; consider dobutamine echo to further assess severity of AS.  He is to follow with Dr. Stanford Breed July 18th. To discuss plans for AS.  EKG: 09/11/14:  Sinus bradycardia with 1st degree A-V block with and with frequent Premature ventricular complexes Premature ventricular complexes are nwe Nonspecific ST and T wave abnormality Abnormal ECG  EKG 08/29/14: ECG sinus rhythm at a rate of 59. First-degree AV block. Nonspecific ST changes  Just back from CT with back pain and abd pain due to fall prior to admit falling over trash can.   Past Medical History  Diagnosis Date  . Diabetes mellitus   . HTN (hypertension)   . Hyperlipemia   . Spinal stenosis   . Thalassemia   . History of Lyme disease   . PTSD (post-traumatic stress disorder)   . Cancer     left cheek  . Hemorrhoids 02/03/2005    Internal and external  . Diverticulosis of colon (without mention of hemorrhage) 02/03/2005  . Personal history of colonic polyps   . Benign neoplasm of colon   . Polymyalgia rheumatica   . Varicose veins   . Aortic stenosis     Past Surgical History  Procedure Laterality Date  . Cataract extraction, bilateral    . Kidney stone surgery    . Hemorroidectomy      Family History  Problem Relation Age of Onset  . Diabetes Father   . Leukemia Mother   . Hypertension Mother   . Arthritis Mother   . Diabetes Sister    Social History:  reports that he has been smoking Cigars.  He has never used smokeless tobacco. He reports that he does not drink alcohol or use illicit drugs.  Allergies:  Allergies  Allergen Reactions  .  Cephalosporins Hives and Itching  . Lamisil Af Defense [Tolnaftate]     Severe hives    OUTPATIENT MEDICATIONS: No current facility-administered medications on file prior to encounter.   Current Outpatient Prescriptions on File Prior to Encounter  Medication Sig Dispense Refill  . amLODipine (NORVASC) 5 MG tablet Take 5 mg by mouth as needed (blood pressure).     . benazepril (LOTENSIN) 40 MG tablet Take 40 mg by mouth daily.    Marland Kitchen buPROPion (WELLBUTRIN) 100 MG tablet Take 100 mg by mouth every evening.     . docusate sodium (COLACE) 100 MG capsule Take 100 mg by mouth daily.     . fish oil-omega-3 fatty acids 1000 MG capsule Take 2 g by mouth every other day.     Marland Kitchen glipiZIDE (GLUCOTROL) 5 MG tablet Take 5 mg by mouth daily before breakfast.    . GRAPE SEED EXTRACT PO Take 1 tablet by mouth daily.    . metFORMIN (GLUCOPHAGE) 1000 MG tablet Take 500 mg by mouth 4 (four) times daily - after meals and at bedtime.     . multivitamin-lutein (OCUVITE-LUTEIN) CAPS capsule Take 1 capsule by mouth daily.    Marland Kitchen  polyethylene glycol (MIRALAX / GLYCOLAX) packet Take 17 g by mouth daily.     . Probiotic Product (PROBIOTIC DAILY PO) Take 1 tablet by mouth 2 (two) times a week.     Marland Kitchen HYDROcodone-acetaminophen (NORCO/VICODIN) 5-325 MG per tablet Take 0.5-1 tablets by mouth every 6 (six) hours as needed. (Patient not taking: Reported on 09/08/2014) 8 tablet 0  . Linaclotide (LINZESS) 290 MCG CAPS capsule Take 1 capsule (290 mcg total) by mouth daily. (Patient not taking: Reported on 09/08/2014) 30 capsule 5  . metoprolol succinate (TOPROL XL) 25 MG 24 hr tablet Take 1 tablet (25 mg total) by mouth daily. (Patient not taking: Reported on 09/03/2014) 90 tablet 3   CURRENT MEDICATIONS: Scheduled Meds: . buPROPion  100 mg Oral QPM  . diphenhydrAMINE  12.5 mg Oral TID  . docusate sodium  100 mg Oral Daily  . enoxaparin (LOVENOX) injection  30 mg Subcutaneous Q24H  . famotidine  20 mg Oral Daily  . insulin aspart   0-9 Units Subcutaneous TID WC  . metoprolol tartrate  12.5 mg Oral BID  . multivitamin-lutein  1 capsule Oral Daily  . omega-3 acid ethyl esters  2 g Oral QODAY  . polyethylene glycol  17 g Oral Daily  . sodium chloride  3 mL Intravenous Q12H   Continuous Infusions:  PRN Meds:.acetaminophen **OR** acetaminophen, albuterol, ALPRAZolam, bisacodyl, HYDROcodone-acetaminophen, ipratropium-albuterol, ondansetron **OR** ondansetron (ZOFRAN) IV   Results for orders placed or performed during the hospital encounter of 09/08/14 (from the past 48 hour(s))  Glucose, capillary     Status: Abnormal   Collection Time: 09/10/14 11:43 AM  Result Value Ref Range   Glucose-Capillary 196 (H) 65 - 99 mg/dL  Glucose, capillary     Status: Abnormal   Collection Time: 09/10/14  5:25 PM  Result Value Ref Range   Glucose-Capillary 213 (H) 65 - 99 mg/dL  Glucose, capillary     Status: Abnormal   Collection Time: 09/10/14  9:33 PM  Result Value Ref Range   Glucose-Capillary 215 (H) 65 - 99 mg/dL  Basic metabolic panel     Status: Abnormal   Collection Time: 09/11/14  4:50 AM  Result Value Ref Range   Sodium 136 135 - 145 mmol/L   Potassium 4.7 3.5 - 5.1 mmol/L   Chloride 108 101 - 111 mmol/L   CO2 22 22 - 32 mmol/L   Glucose, Bld 157 (H) 65 - 99 mg/dL   BUN 42 (H) 6 - 20 mg/dL   Creatinine, Ser 1.56 (H) 0.61 - 1.24 mg/dL   Calcium 8.2 (L) 8.9 - 10.3 mg/dL   GFR calc non Af Amer 37 (L) >60 mL/min   GFR calc Af Amer 43 (L) >60 mL/min    Comment: (NOTE) The eGFR has been calculated using the CKD EPI equation. This calculation has not been validated in all clinical situations. eGFR's persistently <60 mL/min signify possible Chronic Kidney Disease.    Anion gap 6 5 - 15  Glucose, capillary     Status: Abnormal   Collection Time: 09/11/14  7:32 AM  Result Value Ref Range   Glucose-Capillary 178 (H) 65 - 99 mg/dL  Glucose, capillary     Status: Abnormal   Collection Time: 09/11/14 12:06 PM  Result  Value Ref Range   Glucose-Capillary 119 (H) 65 - 99 mg/dL  Blood gas, arterial     Status: Abnormal   Collection Time: 09/11/14 12:50 PM  Result Value Ref Range   O2 Content 2.0 L/min  Delivery systems NASAL CANNULA    pH, Arterial 7.223 (L) 7.350 - 7.450   pCO2 arterial 47.5 (H) 35.0 - 45.0 mmHg   pO2, Arterial 69.6 (L) 80.0 - 100.0 mmHg   Bicarbonate 18.9 (L) 20.0 - 24.0 mEq/L   TCO2 17.3 0 - 100 mmol/L   Acid-base deficit 8.6 (H) 0.0 - 2.0 mmol/L   O2 Saturation 90.7 %   Patient temperature 98.6    Collection site RIGHT RADIAL    Drawn by [id number]    Sample type ARTERIAL DRAW    Allens test (pass/fail) PASS PASS  D-dimer, quantitative (not at [facility])     Status: Abnormal   Collection Time: 09/11/14  2:30 PM  Result Value Ref Range   D-Dimer, Quant 1.65 (H) 0.00 - 0.48 ug/mL-FEU    Comment:        AT THE INHOUSE ESTABLISHED CUTOFF VALUE OF 0.48 ug/mL FEU, THIS ASSAY HAS BEEN DOCUMENTED IN THE LITERATURE TO HAVE A SENSITIVITY AND NEGATIVE PREDICTIVE VALUE OF AT LEAST 98 TO 99%.  THE TEST RESULT SHOULD BE CORRELATED WITH AN ASSESSMENT OF THE CLINICAL PROBABILITY OF DVT / VTE.   Lactic acid, plasma     Status: None   Collection Time: 09/11/14  2:30 PM  Result Value Ref Range   Lactic Acid, Venous 1.5 0.5 - 2.0 mmol/L  Brain natriuretic peptide     Status: Abnormal   Collection Time: 09/11/14  2:30 PM  Result Value Ref Range   B Natriuretic Peptide 1219.6 (H) 0.0 - 100.0 pg/mL  Glucose, capillary     Status: Abnormal   Collection Time: 09/11/14  4:03 PM  Result Value Ref Range   Glucose-Capillary 205 (H) 65 - 99 mg/dL  Lactic acid, plasma     Status: None   Collection Time: 09/11/14  5:20 PM  Result Value Ref Range   Lactic Acid, Venous 1.8 0.5 - 2.0 mmol/L  Glucose, capillary     Status: Abnormal   Collection Time: 09/11/14  9:53 PM  Result Value Ref Range   Glucose-Capillary 306 (H) 65 - 99 mg/dL  Basic metabolic panel     Status: Abnormal   Collection Time:  09/12/14  5:27 AM  Result Value Ref Range   Sodium 139 135 - 145 mmol/L   Potassium 4.3 3.5 - 5.1 mmol/L   Chloride 105 101 - 111 mmol/L   CO2 23 22 - 32 mmol/L   Glucose, Bld 113 (H) 65 - 99 mg/dL   BUN 49 (H) 6 - 20 mg/dL   Creatinine, Ser 1.52 (H) 0.61 - 1.24 mg/dL   Calcium 8.7 (L) 8.9 - 10.3 mg/dL   GFR calc non Af Amer 38 (L) >60 mL/min   GFR calc Af Amer 44 (L) >60 mL/min    Comment: (NOTE) The eGFR has been calculated using the CKD EPI equation. This calculation has not been validated in all clinical situations. eGFR's persistently <60 mL/min signify possible Chronic Kidney Disease.    Anion gap 11 5 - 15  Glucose, capillary     Status: Abnormal   Collection Time: 09/12/14  8:11 AM  Result Value Ref Range   Glucose-Capillary 103 (H) 65 - 99 mg/dL  Troponin I (q 6hr x 3)     Status: None   Collection Time: 09/12/14  8:30 AM  Result Value Ref Range   Troponin I 0.03 <0.031 ng/mL    Comment:        NO INDICATION OF MYOCARDIAL INJURY.  Nm Pulmonary Perf And Vent  09/11/2014   CLINICAL DATA:  Acute shortness of breath.  EXAM: NUCLEAR MEDICINE VENTILATION - PERFUSION LUNG SCAN  TECHNIQUE: Ventilation images were obtained in multiple projections using inhaled aerosol Tc-95mDTPA. Perfusion images were obtained in multiple projections after intravenous injection of Tc-96mAA.  RADIOPHARMACEUTICALS:  45.0 Technetium-9977mPA aerosol inhalation and 6.0 Technetium-44m49m IV  COMPARISON:  Chest x-ray 09/11/2014  FINDINGS: Ventilation: Patchy heterogeneous ventilation scan but no segmental ventilation defects.  Perfusion: No segmental or subsegmental perfusion defects to suggest pulmonary embolism.  IMPRESSION: Negative ventilation perfusion lung scan for pulmonary embolism.   Electronically Signed   By: P.  Marijo Sanes.   On: 09/11/2014 18:43   Dg Chest Port 1 View  09/11/2014   CLINICAL DATA:  Short of breath.  EXAM: PORTABLE CHEST - 1 VIEW  COMPARISON:  09/08/2014  FINDINGS:  There is cardiac enlargement noted. Aortic atherosclerosis identified. Increase lung volumes with chronic coarsened interstitial markings. There is moderate superimposed pulmonary edema.  IMPRESSION: 1. Pulmonary edema 2. Aortic atherosclerosis.   Electronically Signed   By: TaylKerby Moors.   On: 09/11/2014 13:26    ROS: General:no colds or fevers, no weight changes Skin:no rashes or ulcers HEENT:no blurred vision, no congestion CV:see HPI PUL:see HPI GI:no diarrhea constipation or melena, no indigestion GU:no hematuria, no dysuria MS:no joint pain, no claudication Neuro:no syncope, no lightheadedness Endo:no diabetes, no thyroid disease   Blood pressure 152/66, pulse 53, temperature 97.7 F (36.5 C), temperature source Oral, resp. rate 20, height _0  (1.651 m), weight 157 lb 13.6 oz (71.6 kg), SpO2 100 %.  Wt Readings from Last 3 Encounters:  09/12/14 157 lb 13.6 oz (71.6 kg)  09/02/14 160 lb (72.576 kg)  08/29/14 161 lb (73.029 kg)    PE: General:Pleasant affect, NAD Skin:Warm and dry, brisk capillary refill HEENT:normocephalic, sclera clear, mucus membranes moist Neck:supple, + JVD, no bruits  Heart:S1S2 RRR with 3/6 systolic murmur, no gallup, rub or click Lungs: with rales scattered, occ rhonchi, no wheezes Abd:IRS:WNIOn tender, + BS, do not palpate liver spleen or masses Ext:no lower ext edema on rt leg, lt leg with mild edema, discolored, 2+ pedal pulses, 2+ radial pulses Neuro:alert and oriented, MAE, follows commands, + facial symmetry TELE:  SB 47- 58    Assessment/Plan Principal Problem:   ARF (acute renal failure) Active Problems:   GERD   Diabetes mellitus   HTN (hypertension)   Hyperlipemia   Spinal stenosis   PTSD (post-traumatic stress disorder)   Murmur   Tobacco abuse   Chronic venous hypertension without complications   Aortic stenosis   Chronic constipation   Congestive dilated cardiomyopathy   Rash   Venous stasis dermatitis of left lower  extremity   Dehydration   Allergic reaction caused by a drug  1. Acute Pulmonary edema. rec'd IV lasix X one.  -1670 since lasix. Now only +210.  May have been volume overload with low EF and Severe AS. He most recently has been asymptomatic.  MD to see for further recommendations.   2. Severe AS pt was deciding if he wanted conservative management VS thought to TAVR. He seems interested in TAVR.   3. ARF with improving Cr 1.52 down from 1.97 since admit- would continue to hold ACE  4. BP high to controlled. Resume amlodipine though will begin at 5 mg for now, if BP stable to tomorrow would increase to 10 mg  5. Bradycardia to 47 freg. May  need to decrease BB though was on toprol 25 now on lopressor 12.5 bid    6.  panic attack with PTSD from [place] Pager [phone] or after 5pm or weekends call [phone] 09/12/2014, 10:39 AM   History and all data above reviewed.  Patient examined.  I agree with the findings as above. No chest pain.  No SOB The patient exam reveals COR:RRR, parvus et tardus   ,  Lungs: Clear  ,  Abd: Positive bowel sounds, no rebound no guarding, Ext Mild left leg edema  .  All available labs, radiology testing, previous records reviewed. Agree with documented assessment and plan. AS:  He has follow up scheduled to consider TAVR and further work up.  For now stop the beta blocker (heart rate has been in the 40s at times.) Creat has come back to baseline.  He was tolerating ACE before the antibiotics.  I think he should go home on 10 mg of Lotensin at discharge with very close follow up of his creat. He was not on Lasix at home and I do not think that he will need this.     Jeneen Rinks Golda Zavalza  12:53 PM  09/12/2014

## 2014-09-12 NOTE — Progress Notes (Signed)
Pt HR sustaining in the low 50's.  AM lopressor held.  MD made aware.

## 2014-09-12 NOTE — Progress Notes (Signed)
Echocardiogram 2D Echocardiogram has been performed.  [name] 09/12/2014, 2:54 PM

## 2014-09-13 LAB — BASIC METABOLIC PANEL
Anion gap: 8 (ref 5–15)
BUN: 43 mg/dL — AB (ref 6–20)
CALCIUM: 8.7 mg/dL — AB (ref 8.9–10.3)
CO2: 27 mmol/L (ref 22–32)
CREATININE: 1.29 mg/dL — AB (ref 0.61–1.24)
Chloride: 100 mmol/L — ABNORMAL LOW (ref 101–111)
GFR calc Af Amer: 54 mL/min — ABNORMAL LOW (ref >60)
GFR calc non Af Amer: 47 mL/min — ABNORMAL LOW (ref >60)
Glucose, Bld: 170 mg/dL — ABNORMAL HIGH (ref 65–99)
Potassium: 4.2 mmol/L (ref 3.5–5.1)
SODIUM: 135 mmol/L (ref 135–145)

## 2014-09-13 LAB — GLUCOSE, CAPILLARY
GLUCOSE-CAPILLARY: 145 mg/dL — AB (ref 65–99)
GLUCOSE-CAPILLARY: 210 mg/dL — AB (ref 65–99)

## 2014-09-13 MED ORDER — ALPRAZOLAM 0.25 MG PO TABS: 0.2500 mg | ORAL_TABLET | Freq: Two times a day (BID) | ORAL | Status: DC | PRN

## 2014-09-13 MED ORDER — BENAZEPRIL HCL 10 MG PO TABS: 10.0000 mg | ORAL_TABLET | Freq: Every day | ORAL | Status: DC

## 2014-09-13 NOTE — Progress Notes (Signed)
   SUBJECTIVE: The patient is doing well today.  At this time, he denies chest pain, shortness of breath, or any new concerns.  Marland Kitchen amLODipine  5 mg Oral Daily  . buPROPion  100 mg Oral QPM  . diphenhydrAMINE  12.5 mg Oral TID  . docusate sodium  100 mg Oral Daily  . enoxaparin (LOVENOX) injection  30 mg Subcutaneous Q24H  . famotidine  20 mg Oral Daily  . insulin aspart  0-9 Units Subcutaneous TID WC  . multivitamin-lutein  1 capsule Oral Daily  . omega-3 acid ethyl esters  2 g Oral QODAY  . polyethylene glycol  17 g Oral Daily  . sodium chloride  3 mL Intravenous Q12H      OBJECTIVE: Physical Exam: Filed Vitals:   09/12/14 1325 09/12/14 2200 09/13/14 0410 09/13/14 0532  BP: 145/75 120/61 173/85   Pulse: 61 62 65   Temp: 97.5 F (36.4 C) 98.2 F (36.8 C) 98.3 F (36.8 C)   TempSrc: Oral Oral Oral   Resp: 12 16 14    Height:      Weight:    71.1 kg (156 lb 12 oz)  SpO2: 97% 95% 97%     Intake/Output Summary (Last 24 hours) at 09/13/14 8115 Last data filed at 09/13/14 0744  Gross per 24 hour  Intake    480 ml  Output   1475 ml  Net   -995 ml    Telemetry reveals sinus rhythm with PVCs  GEN- The patient is well appearing, alert and oriented x 3 today.   Head- normocephalic, atraumatic Eyes-  Sclera clear, conjunctiva pink Ears- hearing intact Oropharynx- clear Neck- supple,   Lungs- Clear to ausculation bilaterally, normal work of breathing Heart- Regular rate and rhythm, late peaking AS murmur GI- soft, NT, ND, + BS Extremities- no clubbing, cyanosis, + dependant edema Skin- no rash or lesion   LABS: Basic Metabolic Panel:  Recent Labs  09/12/14 0527 09/13/14 0510  NA 139 135  K 4.3 4.2  CL 105 100*  CO2 23 27  GLUCOSE 113* 170*  BUN 49* 43*  CREATININE 1.52* 1.29*  CALCIUM 8.7* 8.7*    ASSESSMENT AND PLAN:  1. Acute Pulmonary edema--> likely exacerbated by AS and acute renal faiulre Improved Would keep Is and Os about even  2. Severe AS pt  was deciding if he wanted conservative management VS thought to TAVR. He seems interested in TAVR.  Given advanced age, a more conservative option is probably best  3. ARF with improving Cr 1.52 down from 1.97 since admit- would continue to hold ACE As per Dr Percival Spanish lotensin 10mg  daily at discharge  4. HTN Stable No change required today  5. Bradycardia He has evidence of conduction system disease on ekg Would minimize beta blocker use  6. panic attack with PTSD from [place] a chaplain which he declined He does not have hope or social support   Cardiology to see as needed over the weekend Please call with questions   Thompson Grayer MD, [facility] 09/13/2014 8:36 AM

## 2014-09-13 NOTE — Discharge Summary (Signed)
Physician Discharge Summary  Antonio Buchanan XBL:[id number] DOB: [birth year] DOA: 09/08/2014  PCP: Donnajean Lopes, MD  Admit date: 09/08/2014 Discharge date: 09/13/2014  Time spent: 20 minutes  Recommendations for Outpatient Follow-up:  1. Follow up with PCP in 1-2 weeks 2. Follow up with Cardiology as scheduled 3. Please repeat renal panel in 1-2 weeks  Discharge Diagnoses:  Principal Problem:   ARF (acute renal failure) Active Problems:   GERD   Diabetes mellitus   HTN (hypertension)   Hyperlipemia   Spinal stenosis   PTSD (post-traumatic stress disorder)   Murmur   Tobacco abuse   Chronic venous hypertension without complications   Aortic stenosis   Chronic constipation   Congestive dilated cardiomyopathy   Rash   Venous stasis dermatitis of left lower extremity   Dehydration   Allergic reaction caused by a drug   Discharge Condition: Improved  Diet recommendation: Heart healthy  Filed Weights   09/11/14 0551 09/12/14 0450 09/13/14 0532  Weight: 73.8 kg (162 lb 11.2 oz) 71.6 kg (157 lb 13.6 oz) 71.1 kg (156 lb 12 oz)    History of present illness:  Please refer to h and p from 6/20 for details. Briefly, pt presented   Hospital Course:  #1 acute renal failure - Possible secondary to acute interstitial nephritis vs eosinophils versus a prerenal azotemia versus post renal azotemia.  - Eosinophils were noted on CBC.  - Fractional excretion of sodium noted to be 2.82% - Possible ATN from recent abx - Renal ultrasound was normal - Antibiotics were held.  - Pt was initially continued on gentle hydration at 75 mL per hour. - Cr has improved since admission  #2 hyperkalemia -Likely secondary to problem #1.  -corrected  #3 left lower extremity erythema/questionable cellulitis/questionable venous stasis dermatitis -Erythema improved during this admit without need for further abx  -Patient had lower extremity Dopplers done prior to admission which were  negative for DVT.  -Patient was seen by dermatologist, and initially placed on Keflex however this was consistent discontinued secondary to concern for probable allergic reaction.  -KOH was done which was negative.  -Patient to follow-up with dermatology as outpatient.  #4 probable allergic reaction - suspect secondary to recent Keflex.  - Rash improved - Pt was continued on IV Pepcid, Benadryl, steroids.  #5 dehydration - Improved with IV fluids  #6 PTSD/depression/anxiety - Continued Wellbutrin - Panic attack noted during this hospitalization - likely contributed to respiratory distress below - Improved with PRN benzo  #7 diabetes mellitus well-controlled - Hemoglobin A1c is 6.1 - Continued to hold oral hypoglycemic agents and continued sliding scale insulin.  #8 hypertension - overall stable.  - Continue on Norvasc and metoprolol.  #9 Constipation - Pt notes improvement in constipation with cathartic  #10 prophylaxis - Pepcid for GI prophylaxis.  - Lovenox for DVT prophylaxis.  #11 Hypoxia - On 6/23, pt noted to have increased sob with tachypnea - ABG with pH of 7.2, mildly elevated pCO2. Question mixed metabolic/respiratory acidosis - CXR with evidence of pulm edema. Given lasix x 1 and IVF was stopped. - Resolved - F/u 2d echo with ef of 35-40% with moderate AS - D dimer was elevated but VQ neg for PE  #12 Acidosis, suspect respiratory and metabolic - On PO bicarbonate - Cont scheduled bronchodilators  #13 Aortic Stenosis - Appreciate input by Cardiology - Recs to hold beta blockers, continue on 10mg  of lotensin on discharge - Pt to follow up closely with Cardiology for consideration for TAVR  Consultations:  Cardiology  Discharge Exam: Filed Vitals:   09/13/14 0410 09/13/14 0532 09/13/14 0955 09/13/14 1224  BP: 173/85  138/65 132/68  Pulse: 65  61 60  Temp: 98.3 F (36.8 C)   98.3 F (36.8 C)  TempSrc: Oral   Oral  Resp: 14   14  Height:       Weight:  71.1 kg (156 lb 12 oz)    SpO2: 97%   98%    General: Awake, in nad Cardiovascular: regular, s1, s2 Respiratory: normal resp effort, no wheezing  Discharge Instructions     Medication List    STOP taking these medications        HYDROcodone-acetaminophen 5-325 MG per tablet  Commonly known as:  NORCO/VICODIN     metoprolol succinate 25 MG 24 hr tablet  Commonly known as:  TOPROL XL      TAKE these medications        ALPRAZolam 0.25 MG tablet  Commonly known as:  XANAX  Take 1 tablet (0.25 mg total) by mouth 2 (two) times daily as needed for anxiety.     amLODipine 5 MG tablet  Commonly known as:  NORVASC  Take 5 mg by mouth as needed (blood pressure).     benazepril 10 MG tablet  Commonly known as:  LOTENSIN  Take 1 tablet (10 mg total) by mouth daily.     buPROPion 100 MG tablet  Commonly known as:  WELLBUTRIN  Take 100 mg by mouth every evening.     docusate sodium 100 MG capsule  Commonly known as:  COLACE  Take 100 mg by mouth daily.     fish oil-omega-3 fatty acids 1000 MG capsule  Take 2 g by mouth every other day.     glipiZIDE 5 MG tablet  Commonly known as:  GLUCOTROL  Take 5 mg by mouth daily before breakfast.     GRAPE SEED EXTRACT PO  Take 1 tablet by mouth daily.     Linaclotide 290 MCG Caps capsule  Commonly known as:  LINZESS  Take 1 capsule (290 mcg total) by mouth daily.     metFORMIN 1000 MG tablet  Commonly known as:  GLUCOPHAGE  Take 500 mg by mouth 4 (four) times daily - after meals and at bedtime.     multivitamin-lutein Caps capsule  Take 1 capsule by mouth daily.     polyethylene glycol packet  Commonly known as:  MIRALAX / GLYCOLAX  Take 17 g by mouth daily.     PROBIOTIC DAILY PO  Take 1 tablet by mouth 2 (two) times a week.       Allergies  Allergen Reactions  . Cephalosporins Hives and Itching  . Lamisil Af Defense [Tolnaftate]     Severe hives   Follow-up Information    Follow up with  Donnajean Lopes, MD. Schedule an appointment as soon as possible for a visit in 1 week.   Specialty:  Internal Medicine   Why:  Hospital follow up   Contact information:   [address] [place] [address] [phone]       Follow up with Kirk Ruths, MD.   Specialty:  Cardiology   Why:  as scheduled, Hospital follow up   Contact information:   [address] [place] [address] [phone]        The results of significant diagnostics from this hospitalization (including imaging, microbiology, ancillary and laboratory) are listed below for reference.    Significant  Diagnostic Studies: Ct Abdomen Pelvis Wo Contrast  09/12/2014   CLINICAL DATA:  Left upper quadrant pain, trauma to left upper quadrant, possible splenic injury  EXAM: CT ABDOMEN AND PELVIS WITHOUT CONTRAST  TECHNIQUE: Multidetector CT imaging of the abdomen and pelvis was performed following the standard protocol without IV contrast.  COMPARISON:  Ultrasound of the abdomen 10/28/2013  FINDINGS: There is small bilateral pleural effusion with bilateral basilar posterior atelectasis. There is mild displaced acute fracture of the left posterior tenth rib please see axial image 24.  Atherosclerotic calcifications of mitral valve and thoracic aorta. Coronary arteries calcifications.  Unenhanced liver shows no biliary ductal dilatation. Again noted cystic lesion in right hepatic lobe anteriorly measures 4.7 by 4 cm. On the priors ultrasound measures 4 cm. Unenhanced pancreas, spleen and adrenal glands are unremarkable. There is no evidence of splenic laceration. No perisplenic fluid or ascites. Atherosclerotic calcifications of abdominal aorta and iliac arteries. At least 2 nonobstructive calcifications are noted in lower pole of the right kidney the largest measures 2.5 mm.  No abdominal aortic aneurysm.  No hydronephrosis or hydroureter.  There is a cyst in midpole of the left kidney measures 1 cm.  No  calcified gallstones are noted within gallbladder.  No pericecal inflammation. The terminal ileum is unremarkable. Moderate stool noted in right colon and transverse colon. Colonic diverticula are noted in left colon. There is a tortuous proximal sigmoid colon. Moderate gas is noted in proximal sigmoid colon. Sigmoid colon diverticula. No evidence of acute diverticulitis. Abundant stool distal sigmoid colon. Measures 6 cm in diameter. There is abundant stool in the rectum measures 8.3 cm in diameter highly suspicious for fecal impaction. Degenerative changes pubic symphysis. There is a second cyst in lower pole of the left kidney measures 1.3 cm.  There is interval moderate compression fracture lower endplate of L1 vertebral body probable chronic in nature. Degenerative changes lumbar spine.  IMPRESSION: 1. Bilateral small pleural effusion with bilateral basilar posterior atelectasis or infiltrate. 2. There is mild displaced fracture of the left posterior tenth rib. 3. No splenic laceration.  No perisplenic fluid. 4. Again noted cystic lesion in right hepatic lobe anteriorly measures 4.7 cm. On the prior exam measures 4.1 x 4 cm. 5. Left renal cysts are noted. 6. Atherosclerotic calcifications of abdominal aorta and iliac arteries. 7. Colonic diverticulosis. No evidence of acute diverticulitis. No pericecal inflammation. 8. There is tortuous proximal sigmoid colon moderate distended with gas. Abundant stool is noted in distal sigmoid colon. Significant stool noted within rectum which measures 8.3 cm in diameter. Findings highly suspicious for fecal impaction. 9. Degenerative changes pubic symphysis. There is interval moderate compression fracture of lower endplate of L1 vertebral body probable chronic in nature. 10. Left nonobstructive nephrolithiasis. No hydronephrosis or hydroureter.   Electronically Signed   By: Lahoma Crocker M.D.   On: 09/12/2014 12:08   Dg Ribs Unilateral W/chest Left  09/08/2014   CLINICAL  DATA:  Left lower flank pain.  Fall today.  EXAM: LEFT RIBS AND CHEST - 3+ VIEW  COMPARISON:  11/27/2006  FINDINGS: Heart is upper limits normal in size. Tortuosity of the thoracic aorta with scattered calcifications. No confluent opacities in the lungs. Scarring or atelectasis at the left base. No visible rib fracture.  IMPRESSION: No visible rib fracture or pneumothorax.  Left base atelectasis or scarring.   Electronically Signed   By: Rolm Baptise M.D.   On: 09/08/2014 17:17   Dg Ankle Complete Left  09/03/2014  CLINICAL DATA:  Leg pain, chronic left lower leg redness  EXAM: LEFT ANKLE COMPLETE - 3+ VIEW  COMPARISON:  None.  FINDINGS: Three views of left ankle submitted. No acute fracture or subluxation. Ankle mortise is preserved. Mild spurring of distal tibia. There is diffuse soft tissue swelling adjacent to lateral and medial malleolus. Small plantar spur of calcaneus. Atherosclerotic vascular calcifications are noted.  IMPRESSION: No acute fracture or subluxation. Diffuse soft tissue swelling. Ankle mortise is preserved. Atherosclerotic vascular calcifications.   Electronically Signed   By: Lahoma Crocker M.D.   On: 09/03/2014 11:16   US Renal  09/10/2014   CLINICAL DATA:  Patient with acute renal failure.  EXAM: RENAL / URINARY TRACT ULTRASOUND COMPLETE  COMPARISON:  Abdominal ultrasound 10/28/2013  FINDINGS: Right Kidney:  Length: 9.9 cm. No hydronephrosis. Probable small cyst within the superior pole measuring 1.1 cm. Normal renal cortical thickness.  Left Kidney:  Length: 10.8 cm. No hydronephrosis. Multiple small hypoechoic lesions, incompletely characterized however potentially representing small cysts.  Bladder:  Appears normal for degree of bladder distention.  IMPRESSION: No hydronephrosis.   Electronically Signed   By: Lovey Newcomer M.D.   On: 09/10/2014 01:53   Nm Pulmonary Perf And Vent  09/11/2014   CLINICAL DATA:  Acute shortness of breath.  EXAM: NUCLEAR MEDICINE VENTILATION - PERFUSION  LUNG SCAN  TECHNIQUE: Ventilation images were obtained in multiple projections using inhaled aerosol Tc-38m DTPA. Perfusion images were obtained in multiple projections after intravenous injection of Tc-24m MAA.  RADIOPHARMACEUTICALS:  45.0 Technetium-41m DTPA aerosol inhalation and 6.0 Technetium-34m MAA IV  COMPARISON:  Chest x-ray 09/11/2014  FINDINGS: Ventilation: Patchy heterogeneous ventilation scan but no segmental ventilation defects.  Perfusion: No segmental or subsegmental perfusion defects to suggest pulmonary embolism.  IMPRESSION: Negative ventilation perfusion lung scan for pulmonary embolism.   Electronically Signed   By: Marijo Sanes M.D.   On: 09/11/2014 18:43   Dg Chest Port 1 View  09/11/2014   CLINICAL DATA:  Short of breath.  EXAM: PORTABLE CHEST - 1 VIEW  COMPARISON:  09/08/2014  FINDINGS: There is cardiac enlargement noted. Aortic atherosclerosis identified. Increase lung volumes with chronic coarsened interstitial markings. There is moderate superimposed pulmonary edema.  IMPRESSION: 1. Pulmonary edema 2. Aortic atherosclerosis.   Electronically Signed   By: Kerby Moors M.D.   On: 09/11/2014 13:26    Microbiology: Recent Results (from the past 240 hour(s))  Culture, Urine     Status: None   Collection Time: 09/08/14  8:11 PM  Result Value Ref Range Status   Specimen Description URINE, CLEAN CATCH  Final   Special Requests NONE  Final   Culture   Final    NO GROWTH 2 DAYS Performed at [facility]    Report Status 09/10/2014 FINAL  Final     Labs: Basic Metabolic Panel:  Recent Labs Lab 09/08/14 1336 09/09/14 0427 09/10/14 0432 09/11/14 0450 09/12/14 0527 09/13/14 0510  NA 138 135 136 136 139 135  K 5.6* 4.7 4.9 4.7 4.3 4.2  CL 103 106 108 108 105 100*  CO2 22 18* 19* 22 23 27   GLUCOSE 136* 202* 238* 157* 113* 170*  BUN 40* 43* 41* 42* 49* 43*  CREATININE 1.97* 1.96* 1.84* 1.56* 1.52* 1.29*  CALCIUM 9.3 8.3* 8.6* 8.2* 8.7* 8.7*  MG 2.0  --   --    --   --   --    Liver Function Tests:  Recent Labs Lab 09/08/14 1336  AST 20  ALT 16*  ALKPHOS 124  BILITOT 0.6  PROT 7.9  ALBUMIN 4.4   No results for input(s): LIPASE, AMYLASE in the last 168 hours. No results for input(s): AMMONIA in the last 168 hours. CBC:  Recent Labs Lab 09/08/14 1336 09/09/14 0427 09/10/14 0432  WBC 8.7 4.2 7.2  NEUTROABS 6.1 3.3  --   HGB 15.2 13.4 13.3  HCT 47.1 41.7 41.5  MCV 82.1 81.6 82.3  PLT 245 217 228   Cardiac Enzymes:  Recent Labs Lab 09/12/14 0830 09/12/14 2000  TROPONINI 0.03 0.04*   BNP: BNP (last 3 results)  Recent Labs  09/11/14 1430  BNP 1219.6*    ProBNP (last 3 results) No results for input(s): PROBNP in the last 8760 hours.  CBG:  Recent Labs Lab 09/12/14 1304 09/12/14 1836 09/12/14 2128 09/13/14 0717 09/13/14 1211  GLUCAP 142* 184* 160* 145* 210*    Signed:  CHIU, STEPHEN K  [facility] 09/13/2014, 5:05 PM

## 2014-09-13 NOTE — Progress Notes (Signed)
Patient being discharged to home with wife. Discharge instructions reviewed with patient and wife utilizing teach back method.

## 2014-09-16 ENCOUNTER — Ambulatory Visit: Payer: Medicare Other | Admitting: Vascular Surgery

## 2014-09-17 DIAGNOSIS — E1151 Type 2 diabetes mellitus with diabetic peripheral angiopathy without gangrene: Secondary | ICD-10-CM | POA: Diagnosis not present

## 2014-09-17 DIAGNOSIS — I350 Nonrheumatic aortic (valve) stenosis: Secondary | ICD-10-CM | POA: Diagnosis not present

## 2014-09-17 DIAGNOSIS — I872 Venous insufficiency (chronic) (peripheral): Secondary | ICD-10-CM | POA: Diagnosis not present

## 2014-09-17 DIAGNOSIS — I10 Essential (primary) hypertension: Secondary | ICD-10-CM | POA: Diagnosis not present

## 2014-09-17 DIAGNOSIS — Z6827 Body mass index (BMI) 27.0-27.9, adult: Secondary | ICD-10-CM | POA: Diagnosis not present

## 2014-09-17 DIAGNOSIS — F419 Anxiety disorder, unspecified: Secondary | ICD-10-CM | POA: Diagnosis not present

## 2014-09-30 ENCOUNTER — Ambulatory Visit: Payer: Medicare Other | Admitting: Vascular Surgery

## 2014-09-30 DIAGNOSIS — L2089 Other atopic dermatitis: Secondary | ICD-10-CM | POA: Diagnosis not present

## 2014-09-30 DIAGNOSIS — I8312 Varicose veins of left lower extremity with inflammation: Secondary | ICD-10-CM | POA: Diagnosis not present

## 2014-09-30 DIAGNOSIS — I872 Venous insufficiency (chronic) (peripheral): Secondary | ICD-10-CM | POA: Diagnosis not present

## 2014-09-30 DIAGNOSIS — I8311 Varicose veins of right lower extremity with inflammation: Secondary | ICD-10-CM | POA: Diagnosis not present

## 2014-10-01 NOTE — Progress Notes (Signed)
HPI: FU AS. Myoview in 2008 showed EF 59 and normal perfusion. Dobutamine echocardiogram in February 2015 was felt consistent with severe aortic stenosis; low normal LV function. Echocardiogram repeated in June of 2015. Ejection fraction 50-55% with moderate aortic stenosis and a mean gradient of 20 mm of mercury; mild biatrial enlargement. FU echo 6/16 showed EF 19-37, grade 1 diastolic dysfunction, moderate AS (mean gradient 25 mmHg), mild TR, mild LAE. Patient admitted in June 2016 with lower extremity cellulitis. Patient given IV fluids for renal insufficiency and developed pulmonary edema. Treatment with IV Lasix and improved. Since discharge  Current Outpatient Prescriptions  Medication Sig Dispense Refill  . ALPRAZolam (XANAX) 0.25 MG tablet Take 1 tablet (0.25 mg total) by mouth 2 (two) times daily as needed for anxiety. 10 tablet 0  . amLODipine (NORVASC) 5 MG tablet Take 5 mg by mouth as needed (blood pressure).     . benazepril (LOTENSIN) 10 MG tablet Take 1 tablet (10 mg total) by mouth daily. 30 tablet 0  . buPROPion (WELLBUTRIN) 100 MG tablet Take 100 mg by mouth every evening.     . docusate sodium (COLACE) 100 MG capsule Take 100 mg by mouth daily.     . fish oil-omega-3 fatty acids 1000 MG capsule Take 2 g by mouth every other day.     Marland Kitchen glipiZIDE (GLUCOTROL) 5 MG tablet Take 5 mg by mouth daily before breakfast.    . GRAPE SEED EXTRACT PO Take 1 tablet by mouth daily.    . Linaclotide (LINZESS) 290 MCG CAPS capsule Take 1 capsule (290 mcg total) by mouth daily. (Patient not taking: Reported on 09/08/2014) 30 capsule 5  . metFORMIN (GLUCOPHAGE) 1000 MG tablet Take 500 mg by mouth 4 (four) times daily - after meals and at bedtime.     . multivitamin-lutein (OCUVITE-LUTEIN) CAPS capsule Take 1 capsule by mouth daily.    . polyethylene glycol (MIRALAX / GLYCOLAX) packet Take 17 g by mouth daily.     . Probiotic Product (PROBIOTIC DAILY PO) Take 1 tablet by mouth 2 (two) times a  week.      No current facility-administered medications for this visit.     Past Medical History  Diagnosis Date  . Diabetes mellitus   . HTN (hypertension)   . Hyperlipemia   . Spinal stenosis   . Thalassemia   . History of Lyme disease   . PTSD (post-traumatic stress disorder)   . Hemorrhoids 02/03/2005    Internal and external  . Diverticulosis of colon (without mention of hemorrhage) 02/03/2005  . Personal history of colonic polyps   . Benign neoplasm of colon   . Polymyalgia rheumatica   . Varicose veins   . Aortic stenosis   . Cancer     left cheek    Past Surgical History  Procedure Laterality Date  . Cataract extraction, bilateral    . Kidney stone surgery    . Hemorroidectomy      History   Social History  . Marital Status: Married    Spouse Name: N/A  . Number of Children: 2  . Years of Education: N/A   Occupational History  . retired Nature conservation officer    Social History Main Topics  . Smoking status: Current Every Day Smoker -- 78 years    Types: Cigars  . Smokeless tobacco: Never Used     Comment: pt states that he does not inhale  . Alcohol Use: No  . Drug Use: No  .  Sexual Activity: Not on file   Other Topics Concern  . Not on file   Social History Narrative    ROS: no fevers or chills, productive cough, hemoptysis, dysphasia, odynophagia, melena, hematochezia, dysuria, hematuria, rash, seizure activity, orthopnea, PND, pedal edema, claudication. Remaining systems are negative.  Physical Exam: Well-developed well-nourished in no acute distress.  Skin is warm and dry.  HEENT is normal.  Neck is supple.  Chest is clear to auscultation with normal expansion.  Cardiovascular exam is regular rate and rhythm.  Abdominal exam nontender or distended. No masses palpated. Extremities show no edema. neuro grossly intact  ECG     This encounter was created in error - please disregard.

## 2014-10-06 ENCOUNTER — Encounter: Payer: Medicare Other | Admitting: Cardiology

## 2014-10-06 DIAGNOSIS — L309 Dermatitis, unspecified: Secondary | ICD-10-CM | POA: Diagnosis not present

## 2014-10-06 DIAGNOSIS — I872 Venous insufficiency (chronic) (peripheral): Secondary | ICD-10-CM | POA: Diagnosis not present

## 2014-10-06 DIAGNOSIS — I8311 Varicose veins of right lower extremity with inflammation: Secondary | ICD-10-CM | POA: Diagnosis not present

## 2014-10-06 DIAGNOSIS — L821 Other seborrheic keratosis: Secondary | ICD-10-CM | POA: Diagnosis not present

## 2014-10-06 DIAGNOSIS — I8312 Varicose veins of left lower extremity with inflammation: Secondary | ICD-10-CM | POA: Diagnosis not present

## 2014-10-07 ENCOUNTER — Telehealth: Payer: Self-pay | Admitting: *Deleted

## 2014-10-07 NOTE — Telephone Encounter (Signed)
Spoke with ruth, Follow up scheduled

## 2014-10-07 NOTE — Telephone Encounter (Signed)
Spoke with pt wife, she states the patient is wanting to go ahead with the cath. Will forward for dr Stanford Breed review and okay to schedule.

## 2014-10-07 NOTE — Telephone Encounter (Signed)
Schedule fuov Kirk Ruths

## 2014-10-09 DIAGNOSIS — L509 Urticaria, unspecified: Secondary | ICD-10-CM | POA: Diagnosis not present

## 2014-10-09 DIAGNOSIS — Z6826 Body mass index (BMI) 26.0-26.9, adult: Secondary | ICD-10-CM | POA: Diagnosis not present

## 2014-10-10 ENCOUNTER — Encounter: Payer: Self-pay | Admitting: Cardiology

## 2014-10-10 ENCOUNTER — Ambulatory Visit (INDEPENDENT_AMBULATORY_CARE_PROVIDER_SITE_OTHER): Payer: Medicare Other | Admitting: Cardiology

## 2014-10-10 VITALS — BP 144/71 | HR 55 | Ht 62.5 in | Wt 147.9 lb

## 2014-10-10 DIAGNOSIS — N179 Acute kidney failure, unspecified: Secondary | ICD-10-CM

## 2014-10-10 DIAGNOSIS — E785 Hyperlipidemia, unspecified: Secondary | ICD-10-CM | POA: Diagnosis not present

## 2014-10-10 DIAGNOSIS — I10 Essential (primary) hypertension: Secondary | ICD-10-CM | POA: Diagnosis not present

## 2014-10-10 DIAGNOSIS — I350 Nonrheumatic aortic (valve) stenosis: Secondary | ICD-10-CM

## 2014-10-10 DIAGNOSIS — I420 Dilated cardiomyopathy: Secondary | ICD-10-CM

## 2014-10-10 LAB — BASIC METABOLIC PANEL WITH GFR
BUN: 36 mg/dL — AB (ref 6–23)
CALCIUM: 9.1 mg/dL (ref 8.4–10.5)
CHLORIDE: 103 meq/L (ref 96–112)
CO2: 24 mEq/L (ref 19–32)
CREATININE: 1.45 mg/dL — AB (ref 0.50–1.35)
GFR, Est African American: 48 mL/min — ABNORMAL LOW
GFR, Est Non African American: 42 mL/min — ABNORMAL LOW
Glucose, Bld: 172 mg/dL — ABNORMAL HIGH (ref 70–99)
Potassium: 5.4 mEq/L — ABNORMAL HIGH (ref 3.5–5.3)
Sodium: 137 mEq/L (ref 135–145)

## 2014-10-10 NOTE — Assessment & Plan Note (Signed)
Long discussion today with patient and family concerning aortic stenosis. He appears to have moderate to severe aortic stenosis with reduced LV function. Ordinarily I would proceed with aortic valve replacement. However he is not having symptoms. He is 81 years of age and has some degree of renal insufficiency and would be at risk for procedures. I would therefore prefer to continue conservative measures unless he develops symptoms. He is in agreement with this and would also like conservative measures if possible. I have explained that there is risks with not proceeding with TAVR including sudden cardiac death. They seem to understand. We will consider pursuing this further in the future if he develops symptoms.

## 2014-10-10 NOTE — Progress Notes (Signed)
HPI: FU AS. Myoview in 2008 showed EF 59 and normal perfusion. Dobutamine echocardiogram in February 2015 was felt consistent with severe aortic stenosis; low normal LV function. Echocardiogram repeated in June of 2015. Ejection fraction 50-55% with moderate aortic stenosis and a mean gradient of 20 mm of mercury; mild biatrial enlargement. Last echocardiogram June 2016 showed ejection fraction 29-79%, grade 1 diastolic dysfunction, severe aortic stenosis with mean gradient 25 mmHg and AVA 0.7 cm2, mild left atrial enlargement and mild tricuspid regurgitation. Recently admitted with cellulitis and worsening renal function felt secondary to acute interstitial nephritis versus prerenal azotemia. Since last seen, patient denies dyspnea, chest pain or palpitations. He states he had a syncopal episode 2 months ago while smoking marijuana.  Current Outpatient Prescriptions  Medication Sig Dispense Refill  . ALPRAZolam (XANAX) 0.25 MG tablet Take 1 tablet (0.25 mg total) by mouth 2 (two) times daily as needed for anxiety. 10 tablet 0  . amLODipine (NORVASC) 5 MG tablet Take 5 mg by mouth as needed (blood pressure).     . benazepril (LOTENSIN) 10 MG tablet Take 1 tablet (10 mg total) by mouth daily. 30 tablet 0  . buPROPion (WELLBUTRIN) 100 MG tablet Take 100 mg by mouth every evening.     . docusate sodium (COLACE) 100 MG capsule Take 100 mg by mouth daily.     . fish oil-omega-3 fatty acids 1000 MG capsule Take 2 g by mouth every other day.     Marland Kitchen glipiZIDE (GLUCOTROL) 5 MG tablet Take 5 mg by mouth daily before breakfast.    . GRAPE SEED EXTRACT PO Take 1 tablet by mouth daily.    . Linaclotide (LINZESS) 290 MCG CAPS capsule Take 1 capsule (290 mcg total) by mouth daily. 30 capsule 5  . metFORMIN (GLUCOPHAGE) 1000 MG tablet Take 500 mg by mouth 4 (four) times daily - after meals and at bedtime.     . multivitamin-lutein (OCUVITE-LUTEIN) CAPS capsule Take 1 capsule by mouth daily.    . polyethylene  glycol (MIRALAX / GLYCOLAX) packet Take 17 g by mouth daily.     . Probiotic Product (PROBIOTIC DAILY PO) Take 1 tablet by mouth 2 (two) times a week.      No current facility-administered medications for this visit.     Past Medical History  Diagnosis Date  . Diabetes mellitus   . HTN (hypertension)   . Hyperlipemia   . Spinal stenosis   . Thalassemia   . History of Lyme disease   . PTSD (post-traumatic stress disorder)   . Hemorrhoids 02/03/2005    Internal and external  . Diverticulosis of colon (without mention of hemorrhage) 02/03/2005  . Personal history of colonic polyps   . Benign neoplasm of colon   . Polymyalgia rheumatica   . Varicose veins   . Aortic stenosis   . Cancer     left cheek    Past Surgical History  Procedure Laterality Date  . Cataract extraction, bilateral    . Kidney stone surgery    . Hemorroidectomy      History   Social History  . Marital Status: Married    Spouse Name: N/A  . Number of Children: 2  . Years of Education: N/A   Occupational History  . retired Nature conservation officer    Social History Main Topics  . Smoking status: Current Every Day Smoker -- 78 years    Types: Cigars  . Smokeless tobacco: Never Used  Comment: pt states that he does not inhale  . Alcohol Use: No  . Drug Use: No  . Sexual Activity: Not on file   Other Topics Concern  . Not on file   Social History Narrative    ROS: no fevers or chills, productive cough, hemoptysis, dysphasia, odynophagia, melena, hematochezia, dysuria, hematuria, rash, seizure activity, orthopnea, PND, pedal edema, claudication. Remaining systems are negative.  Physical Exam: Well-developed well-nourished in no acute distress.  Skin is warm and dry.  HEENT is normal.  Neck is supple.  Chest is clear to auscultation with normal expansion.  Cardiovascular exam is regular rate and rhythm. 3/6 systolic murmur left sternal border. S2 is diminished. Abdominal exam nontender or distended.  No masses palpated. Extremities show erythema left lower extremity, trace edema, varicosities noted. neuro grossly intact

## 2014-10-10 NOTE — Assessment & Plan Note (Signed)
Plan to recheck potassium and renal function.

## 2014-10-10 NOTE — Patient Instructions (Signed)
Your physician recommends that you schedule a follow-up appointment in: 3 MONTHS WITH DR CRENSHAW  Your physician recommends that you HAVE LAB WORK TODAY  

## 2014-10-10 NOTE — Assessment & Plan Note (Signed)
Patient's heart rate runs in the 50s with long first-degree AV block. I will therefore not add a beta blocker. Continue ACE inhibitor.

## 2014-10-10 NOTE — Assessment & Plan Note (Signed)
Management per primary care. 

## 2014-10-10 NOTE — Assessment & Plan Note (Signed)
Continue present medications. 

## 2014-10-13 ENCOUNTER — Other Ambulatory Visit: Payer: Self-pay | Admitting: *Deleted

## 2014-10-13 DIAGNOSIS — E875 Hyperkalemia: Secondary | ICD-10-CM

## 2014-10-13 DIAGNOSIS — K1370 Unspecified lesions of oral mucosa: Secondary | ICD-10-CM | POA: Diagnosis not present

## 2014-10-16 NOTE — Progress Notes (Signed)
   Subjective:    Patient ID: Antonio Buchanan, male    DOB: Jun 16, 1921, 81 y.o.   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 Nails thick brittle [place] friable discolored painful tender 1 through 5 bilateral. No other open wounds no ulcers no secondary infections mild flexible digital contractures are noted. Is decreased sensation Semmes Weinstein testing to the forefoot digits and arch.       Assessment & Plan:  Assessment diabetes history peripheral neuropathy and angiopathy. Painful mycotic dystrophic nails debrided 1 through 5 bilateral.  Reappointed in 3 months for palliative nail care

## 2014-10-21 DIAGNOSIS — E875 Hyperkalemia: Secondary | ICD-10-CM | POA: Diagnosis not present

## 2014-10-22 LAB — BASIC METABOLIC PANEL WITH GFR
BUN: 30 mg/dL — ABNORMAL HIGH (ref 7–25)
CALCIUM: 9.3 mg/dL (ref 8.6–10.3)
CO2: 24 mmol/L (ref 20–31)
CREATININE: 1.13 mg/dL — AB (ref 0.70–1.11)
Chloride: 98 mmol/L (ref 98–110)
GFR, Est African American: 65 mL/min (ref >=60)
GFR, Est Non African American: 57 mL/min — ABNORMAL LOW (ref >=60)
Glucose, Bld: 151 mg/dL — ABNORMAL HIGH (ref 65–99)
Potassium: 5.6 mmol/L — ABNORMAL HIGH (ref 3.5–5.3)
Sodium: 133 mmol/L — ABNORMAL LOW (ref 135–146)

## 2014-10-23 ENCOUNTER — Other Ambulatory Visit: Payer: Self-pay | Admitting: *Deleted

## 2014-10-23 DIAGNOSIS — E875 Hyperkalemia: Secondary | ICD-10-CM

## 2014-11-04 ENCOUNTER — Encounter: Payer: Self-pay | Admitting: Podiatry

## 2014-11-04 ENCOUNTER — Ambulatory Visit (INDEPENDENT_AMBULATORY_CARE_PROVIDER_SITE_OTHER): Payer: Medicare Other | Admitting: Podiatry

## 2014-11-04 DIAGNOSIS — E875 Hyperkalemia: Secondary | ICD-10-CM | POA: Diagnosis not present

## 2014-11-04 DIAGNOSIS — B351 Tinea unguium: Secondary | ICD-10-CM

## 2014-11-04 DIAGNOSIS — M79673 Pain in unspecified foot: Secondary | ICD-10-CM

## 2014-11-04 NOTE — Progress Notes (Signed)
Patient ID: Antonio Buchanan, male   DOB: 12-09-1921, 81 y.o.   MRN: [id number] Complaint:  Visit Type: Patient returns to my office for continued preventative foot care services. Complaint: Patient states" my nails have grown long and thick and become painful to walk and wear shoes" Patient has been diagnosed with DM with neuropathy and angiopathy.. The patient presents for preventative foot care services. No changes to ROS  Podiatric Exam: Vascular: dorsalis pedis and posterior tibial pulses are palpable bilateral. Capillary return is immediate. Temperature gradient is WNL. Skin turgor WNL  The left leg has redness swelling and red skin lesions left leg.   Sensorium: Diminished Semmes Weinstein monofilament test. Normal tactile sensation bilaterally. Nail Exam: Pt has thick disfigured discolored nails with subungual debris noted bilateral entire nail hallux through fifth toenails Ulcer Exam: There is no evidence of ulcer or pre-ulcerative changes or infection. Orthopedic Exam: Muscle tone and strength are WNL. No limitations in general ROM. No crepitus or effusions noted. Foot type and digits show no abnormalities. Bony prominences are unremarkable. Skin: No Porokeratosis. No infection or ulcers  Diagnosis:  Onychomycosis, , Pain in right toe, pain in left toes  Treatment & Plan Procedures and Treatment: Consent by patient was obtained for treatment procedures. The patient understood the discussion of treatment and procedures well. All questions were answered thoroughly reviewed. Debridement of mycotic and hypertrophic toenails, 1 through 5 bilateral and clearing of subungual debris. No ulceration, no infection noted.  Return Visit-Office Procedure: Patient instructed to return to the office for a follow up visit 3 months for continued evaluation and treatment.

## 2014-11-05 ENCOUNTER — Telehealth: Payer: Self-pay | Admitting: Cardiology

## 2014-11-05 LAB — BASIC METABOLIC PANEL
BUN: 27 mg/dL — ABNORMAL HIGH (ref 7–25)
CALCIUM: 9.2 mg/dL (ref 8.6–10.3)
CO2: 23 mmol/L (ref 20–31)
Chloride: 101 mmol/L (ref 98–110)
Creat: 1.06 mg/dL (ref 0.70–1.11)
Glucose, Bld: 108 mg/dL — ABNORMAL HIGH (ref 65–99)
Potassium: 5.3 mmol/L (ref 3.5–5.3)
SODIUM: 136 mmol/L (ref 135–146)

## 2014-11-05 NOTE — Telephone Encounter (Signed)
Left message for pt to call.

## 2014-11-05 NOTE — Telephone Encounter (Signed)
Calling to let you know that he took a blood test on yesterday and bhe is ready to go thru whatever test . Please call   Thanks

## 2014-11-06 ENCOUNTER — Telehealth: Payer: Self-pay | Admitting: Cardiology

## 2014-11-06 ENCOUNTER — Encounter: Payer: Self-pay | Admitting: Cardiology

## 2014-11-06 NOTE — Telephone Encounter (Signed)
This encounter was created in error - please disregard.

## 2014-11-06 NOTE — Telephone Encounter (Signed)
Close this encounter,duplicate name.

## 2014-11-06 NOTE — Telephone Encounter (Signed)
Spoke with pt, Follow up scheduled. Pt has a lot of questions regarding surgery on his valve.

## 2014-11-06 NOTE — Telephone Encounter (Signed)
Please call,he says it is very important.

## 2014-11-06 NOTE — Telephone Encounter (Signed)
Please call,says it is very important.

## 2014-11-08 NOTE — Progress Notes (Signed)
HPI: FU AS. Myoview in 2008 showed EF 59 and normal perfusion. Dobutamine echocardiogram in February 2015 was felt consistent with severe aortic stenosis; low normal LV function. Echocardiogram repeated in June of 2015. Ejection fraction 50-55% with moderate aortic stenosis and a mean gradient of 20 mm of mercury; mild biatrial enlargement. Last echocardiogram June 2016 showed ejection fraction 89-37%, grade 1 diastolic dysfunction, severe aortic stenosis with mean gradient 25 mmHg and AVA 0.7 cm2, mild left atrial enlargement and mild tricuspid regurgitation. At last OV, after discussions with pt and family, we elected conservative measures for his AS given his age and lack of symptoms. Since last seen, Patient denies dyspnea, chest pain, palpitations or syncope.   Current Outpatient Prescriptions  Medication Sig Dispense Refill  . ALPRAZolam (XANAX) 0.25 MG tablet Take 1 tablet (0.25 mg total) by mouth 2 (two) times daily as needed for anxiety. 10 tablet 0  . amLODipine (NORVASC) 5 MG tablet Take 5 mg by mouth as needed (blood pressure).     Marland Kitchen buPROPion (WELLBUTRIN) 100 MG tablet Take 100 mg by mouth every evening.     . docusate sodium (COLACE) 100 MG capsule Take 100 mg by mouth daily.     . fish oil-omega-3 fatty acids 1000 MG capsule Take 2 g by mouth every other day.     Marland Kitchen glipiZIDE (GLUCOTROL) 5 MG tablet Take 5 mg by mouth daily before breakfast.    . GRAPE SEED EXTRACT PO Take 1 tablet by mouth daily.    . Linaclotide (LINZESS) 290 MCG CAPS capsule Take 1 capsule (290 mcg total) by mouth daily. 30 capsule 5  . metFORMIN (GLUCOPHAGE) 1000 MG tablet Take 1,000 mg by mouth 2 (two) times daily with a meal.     . multivitamin-lutein (OCUVITE-LUTEIN) CAPS capsule Take 1 capsule by mouth daily.    . polyethylene glycol (MIRALAX / GLYCOLAX) packet Take 17 g by mouth daily.      No current facility-administered medications for this visit.     Past Medical History  Diagnosis Date    . Diabetes mellitus   . HTN (hypertension)   . Hyperlipemia   . Spinal stenosis   . Thalassemia   . History of Lyme disease   . PTSD (post-traumatic stress disorder)   . Hemorrhoids 02/03/2005    Internal and external  . Diverticulosis of colon (without mention of hemorrhage) 02/03/2005  . Personal history of colonic polyps   . Benign neoplasm of colon   . Polymyalgia rheumatica   . Varicose veins   . Aortic stenosis   . Cancer     left cheek    Past Surgical History  Procedure Laterality Date  . Cataract extraction, bilateral    . Kidney stone surgery    . Hemorroidectomy      Social History   Social History  . Marital Status: Married    Spouse Name: N/A  . Number of Children: 2  . Years of Education: N/A   Occupational History  . retired Nature conservation officer    Social History Main Topics  . Smoking status: Current Every Day Smoker -- 78 years    Types: Cigars  . Smokeless tobacco: Never Used     Comment: pt states that he does not inhale  . Alcohol Use: No  . Drug Use: No  . Sexual Activity: Not on file   Other Topics Concern  . Not on file   Social History Narrative    ROS: no  fevers or chills, productive cough, hemoptysis, dysphasia, odynophagia, melena, hematochezia, dysuria, hematuria, rash, seizure activity, orthopnea, PND, pedal edema, claudication. Remaining systems are negative.  Physical Exam: Well-developed well-nourished in no acute distress.  Skin is warm and dry.  HEENT is normal.  Neck is supple.  Chest is clear to auscultation with normal expansion.  Cardiovascular exam is regular rate and rhythm. 3/6 systolic murmur Abdominal exam nontender or distended. No masses palpated. Extremities show chronic skin changes and erythema and 1+ edema on the right. No edema on the left. neuro grossly intact

## 2014-11-14 ENCOUNTER — Encounter: Payer: Self-pay | Admitting: Cardiology

## 2014-11-14 ENCOUNTER — Ambulatory Visit (INDEPENDENT_AMBULATORY_CARE_PROVIDER_SITE_OTHER): Payer: Medicare Other | Admitting: Cardiology

## 2014-11-14 VITALS — BP 142/78 | HR 70 | Ht 62.0 in | Wt 155.9 lb

## 2014-11-14 DIAGNOSIS — E785 Hyperlipidemia, unspecified: Secondary | ICD-10-CM | POA: Diagnosis not present

## 2014-11-14 DIAGNOSIS — I10 Essential (primary) hypertension: Secondary | ICD-10-CM

## 2014-11-14 DIAGNOSIS — I420 Dilated cardiomyopathy: Secondary | ICD-10-CM

## 2014-11-14 DIAGNOSIS — I350 Nonrheumatic aortic (valve) stenosis: Secondary | ICD-10-CM | POA: Diagnosis not present

## 2014-11-14 NOTE — Assessment & Plan Note (Signed)
Blood pressure controlled. Continue present medications. 

## 2014-11-14 NOTE — Patient Instructions (Signed)
Your physician recommends that you schedule a follow-up appointment in: AS SCHEDULED  

## 2014-11-14 NOTE — Assessment & Plan Note (Signed)
Beta blocker has not been added because of long first-degree AV block and baseline bradycardia. ACE inhibitor discontinued previously because of renal insufficiency which may have been related to antibiotic use. We will consider trying low-dose ACE inhibitor at next office visit.

## 2014-11-14 NOTE — Assessment & Plan Note (Signed)
Management per primary care. 

## 2014-11-14 NOTE — Assessment & Plan Note (Signed)
Another long discussion today with patient and family (wife, daughter; son via phone) concerning aortic stenosis. He appears to have moderate to severe aortic stenosis with reduced LV function. We discussed options of referral for consideration of TAVR now versus conservative measures and following for occurrence of symptoms and then referring at that time. Ordinarily I would proceed with aortic valve replacement now given reduced LV function. However he is asymptomatic. He is 81 years of age and any procedure would carry risk. I would therefore prefer to continue conservative measures unless he develops symptoms. He is in agreement with this and would also like conservative measures if possible. I have explained that there is risk with not proceeding with TAVR including sudden cardiac death. They seem to understand. We will see him back in October. We will most likely repeat his echo at that point. He will contact us if he develops dyspnea, chest pain or syncope. Approximately 45 minutes spent in discussions with patient and family today.

## 2014-11-21 DIAGNOSIS — Z23 Encounter for immunization: Secondary | ICD-10-CM | POA: Diagnosis not present

## 2014-11-21 DIAGNOSIS — Z961 Presence of intraocular lens: Secondary | ICD-10-CM | POA: Diagnosis not present

## 2014-11-21 DIAGNOSIS — H1130 Conjunctival hemorrhage, unspecified eye: Secondary | ICD-10-CM | POA: Diagnosis not present

## 2014-12-25 DIAGNOSIS — Z6827 Body mass index (BMI) 27.0-27.9, adult: Secondary | ICD-10-CM | POA: Diagnosis not present

## 2014-12-25 DIAGNOSIS — H6590 Unspecified nonsuppurative otitis media, unspecified ear: Secondary | ICD-10-CM | POA: Diagnosis not present

## 2015-01-02 ENCOUNTER — Encounter: Payer: Self-pay | Admitting: *Deleted

## 2015-01-05 NOTE — Progress Notes (Signed)
HPI: FU AS. Myoview in 2008 showed EF 59 and normal perfusion. Dobutamine echocardiogram in February 2015 was felt consistent with severe aortic stenosis; low normal LV function. Echocardiogram repeated in June of 2015. Ejection fraction 50-55% with moderate aortic stenosis and a mean gradient of 20 mm of mercury; mild biatrial enlargement. Last echocardiogram June 2016 showed ejection fraction 65-46%, grade 1 diastolic dysfunction, severe aortic stenosis with mean gradient 25 mmHg and AVA 0.7 cm2, mild left atrial enlargement and mild tricuspid regurgitation. At last OV, after discussions with pt and family, we elected conservative measures for his AS given his age and lack of symptoms. Since last seen, He denies dyspnea, chest pain, palpitations or syncope. No new pedal edema.  Current Outpatient Prescriptions  Medication Sig Dispense Refill  . amLODipine (NORVASC) 5 MG tablet Take 5 mg by mouth as needed (blood pressure).     Marland Kitchen buPROPion (WELLBUTRIN) 100 MG tablet Take 100 mg by mouth every evening.     . docusate sodium (COLACE) 100 MG capsule Take 100 mg by mouth daily.     Marland Kitchen escitalopram (LEXAPRO) 10 MG tablet Take 10 mg by mouth daily.    . fish oil-omega-3 fatty acids 1000 MG capsule Take 2 g by mouth every other day.     Marland Kitchen glipiZIDE (GLUCOTROL) 5 MG tablet Take 5 mg by mouth daily before breakfast.    . GRAPE SEED EXTRACT PO Take 1 tablet by mouth daily.    . Linaclotide (LINZESS) 290 MCG CAPS capsule Take 1 capsule (290 mcg total) by mouth daily. 30 capsule 5  . Melatonin 10 MG TABS Take 1 capsule by mouth daily.    . metFORMIN (GLUCOPHAGE) 1000 MG tablet Take 1,000 mg by mouth 2 (two) times daily with a meal.     . multivitamin-lutein (OCUVITE-LUTEIN) CAPS capsule Take 1 capsule by mouth daily.    . polyethylene glycol (MIRALAX / GLYCOLAX) packet Take 17 g by mouth daily.      No current facility-administered medications for this visit.     Past Medical History  Diagnosis  Date  . Diabetes mellitus   . HTN (hypertension)   . Hyperlipemia   . Spinal stenosis   . Thalassemia   . History of Lyme disease   . PTSD (post-traumatic stress disorder)   . Hemorrhoids 02/03/2005    Internal and external  . Diverticulosis of colon (without mention of hemorrhage) 02/03/2005  . Personal history of colonic polyps   . Benign neoplasm of colon   . Polymyalgia rheumatica ([place])   . Varicose veins   . Aortic stenosis   . Cancer [facility])     left cheek    Past Surgical History  Procedure Laterality Date  . Cataract extraction, bilateral    . Kidney stone surgery    . Hemorroidectomy      Social History   Social History  . Marital Status: Married    Spouse Name: N/A  . Number of Children: 2  . Years of Education: N/A   Occupational History  . retired Nature conservation officer    Social History Main Topics  . Smoking status: Current Every Day Smoker -- 78 years    Types: Cigars  . Smokeless tobacco: Never Used     Comment: pt states that he does not inhale  . Alcohol Use: No  . Drug Use: No  . Sexual Activity: Not on file   Other Topics Concern  . Not on file   Social  History Narrative    ROS: no fevers or chills, productive cough, hemoptysis, dysphasia, odynophagia, melena, hematochezia, dysuria, hematuria, rash, seizure activity, orthopnea, PND, pedal edema, claudication. Remaining systems are negative.  Physical Exam: Well-developed well-nourished in no acute distress.  Skin is warm and dry.  HEENT is normal.  Neck is supple.  Chest is clear to auscultation with normal expansion.  Cardiovascular exam is regular rate and rhythm. 3/6 systolic murmur left sternal border. S2 is diminished. Abdominal exam nontender or distended. No masses palpated. Extremities show no edema. neuro grossly intact

## 2015-01-06 ENCOUNTER — Encounter: Payer: Self-pay | Admitting: Cardiology

## 2015-01-06 ENCOUNTER — Ambulatory Visit (INDEPENDENT_AMBULATORY_CARE_PROVIDER_SITE_OTHER): Payer: Medicare Other | Admitting: Cardiology

## 2015-01-06 ENCOUNTER — Encounter: Payer: Self-pay | Admitting: *Deleted

## 2015-01-06 VITALS — BP 130/60 | HR 50 | Ht 64.5 in | Wt 153.7 lb

## 2015-01-06 DIAGNOSIS — I350 Nonrheumatic aortic (valve) stenosis: Secondary | ICD-10-CM

## 2015-01-06 DIAGNOSIS — I420 Dilated cardiomyopathy: Secondary | ICD-10-CM | POA: Diagnosis not present

## 2015-01-06 MED ORDER — LISINOPRIL 2.5 MG PO TABS: 2.5000 mg | ORAL_TABLET | Freq: Every day | ORAL | Status: DC

## 2015-01-06 NOTE — Assessment & Plan Note (Signed)
Last echocardiogram showed ejection fraction 35-40%. Will not add a beta blocker given resting bradycardia and long first degree AV block. I will try low-dose ACE inhibitor. Begin lisinopril 2.5 mg daily. Check potassium and kidney function in one week.

## 2015-01-06 NOTE — Assessment & Plan Note (Signed)
Blood pressure controlled. Continue present medications. 

## 2015-01-06 NOTE — Assessment & Plan Note (Addendum)
Another long discussion today with patient and daughter concerning aortic stenosis. He appears to have moderate to severe aortic stenosis with reduced LV function. We discussed options of referral for consideration of TAVR now versus conservative measures and following for occurrence of symptoms and then referring at that time. Ordinarily I would proceed with aortic valve replacement now given reduced LV function. However he is asymptomatic. He is 81 years of age and any procedure would carry risk. I would therefore prefer to continue conservative measures unless he develops symptoms. He is in agreement with this and would also like conservative measures if possible. I have explained that there is risk with not proceeding with TAVR including sudden cardiac death. They seem to understand. He will contact us if he develops dyspnea, chest pain or syncope. Repeat echo.

## 2015-01-06 NOTE — Patient Instructions (Signed)
Medication Instructions:   START LISINOPRIL 2.5 MG ONCE DAILY  Labwork:  Your physician recommends that you return for lab work in: [place]  Testing/Procedures:  Your physician has requested that you have an echocardiogram. Echocardiography is a painless test that uses sound waves to create images of your heart. It provides your doctor with information about the size and shape of your heart and how well your heart's chambers and valves are working. This procedure takes approximately one hour. There are no restrictions for this procedure.    Follow-Up:  Your physician wants you to follow-up in: [place] will receive a reminder letter in the mail two months in advance. If you don't receive a letter, please call our office to schedule the follow-up appointment.

## 2015-01-08 ENCOUNTER — Telehealth: Payer: Self-pay | Admitting: Cardiology

## 2015-01-08 NOTE — Telephone Encounter (Signed)
Please make sure pts meds are correct, lotensin and atenolol not listed on recent ov [facility]

## 2015-01-08 NOTE — Telephone Encounter (Signed)
Please call,need to talk to you about his medicine.

## 2015-01-08 NOTE — Telephone Encounter (Signed)
Pt called wanting to know he should start the new medication prescribed by Dr. Stanford Breed on 10/18, Lisinopril 2.5mg  as he is already on an ACE inhibitor and didn't know if Dr. Stanford Breed knew.  Pt currently taking Atenolol 25 mg QD and Benazapril HCL 40 mg QD.  Review pt question with DOD, [place], orders for pt to continue taking medications he is currently on and not to start Lisinopril.  Pt medication list updated  Pt made aware no questions at this time.

## 2015-01-09 NOTE — Telephone Encounter (Signed)
Spoke with pt, encouraged pt to make sure to bring all of his medications to appointment so duplicate medications are not prescribed.

## 2015-01-22 ENCOUNTER — Ambulatory Visit (HOSPITAL_COMMUNITY): Payer: Medicare Other | Attending: Internal Medicine

## 2015-01-22 ENCOUNTER — Other Ambulatory Visit: Payer: Self-pay

## 2015-01-22 DIAGNOSIS — E785 Hyperlipidemia, unspecified: Secondary | ICD-10-CM | POA: Insufficient documentation

## 2015-01-22 DIAGNOSIS — F17200 Nicotine dependence, unspecified, uncomplicated: Secondary | ICD-10-CM | POA: Insufficient documentation

## 2015-01-22 DIAGNOSIS — E119 Type 2 diabetes mellitus without complications: Secondary | ICD-10-CM | POA: Diagnosis not present

## 2015-01-22 DIAGNOSIS — I059 Rheumatic mitral valve disease, unspecified: Secondary | ICD-10-CM | POA: Insufficient documentation

## 2015-01-22 DIAGNOSIS — R002 Palpitations: Secondary | ICD-10-CM | POA: Insufficient documentation

## 2015-01-22 DIAGNOSIS — I10 Essential (primary) hypertension: Secondary | ICD-10-CM | POA: Insufficient documentation

## 2015-01-22 DIAGNOSIS — I517 Cardiomegaly: Secondary | ICD-10-CM | POA: Diagnosis not present

## 2015-01-22 DIAGNOSIS — I350 Nonrheumatic aortic (valve) stenosis: Secondary | ICD-10-CM | POA: Diagnosis not present

## 2015-02-04 DIAGNOSIS — I10 Essential (primary) hypertension: Secondary | ICD-10-CM | POA: Diagnosis not present

## 2015-02-04 DIAGNOSIS — E1151 Type 2 diabetes mellitus with diabetic peripheral angiopathy without gangrene: Secondary | ICD-10-CM | POA: Diagnosis not present

## 2015-02-04 DIAGNOSIS — Z6828 Body mass index (BMI) 28.0-28.9, adult: Secondary | ICD-10-CM | POA: Diagnosis not present

## 2015-02-04 DIAGNOSIS — H6122 Impacted cerumen, left ear: Secondary | ICD-10-CM | POA: Diagnosis not present

## 2015-02-04 DIAGNOSIS — I739 Peripheral vascular disease, unspecified: Secondary | ICD-10-CM | POA: Diagnosis not present

## 2015-02-04 DIAGNOSIS — I350 Nonrheumatic aortic (valve) stenosis: Secondary | ICD-10-CM | POA: Diagnosis not present

## 2015-02-11 DIAGNOSIS — Z6828 Body mass index (BMI) 28.0-28.9, adult: Secondary | ICD-10-CM | POA: Diagnosis not present

## 2015-02-11 DIAGNOSIS — H6122 Impacted cerumen, left ear: Secondary | ICD-10-CM | POA: Diagnosis not present

## 2015-02-11 DIAGNOSIS — H9202 Otalgia, left ear: Secondary | ICD-10-CM | POA: Diagnosis not present

## 2015-03-04 DIAGNOSIS — H7202 Central perforation of tympanic membrane, left ear: Secondary | ICD-10-CM | POA: Diagnosis not present

## 2015-03-04 DIAGNOSIS — H6612 Chronic tubotympanic suppurative otitis media, left ear: Secondary | ICD-10-CM | POA: Diagnosis not present

## 2015-03-04 DIAGNOSIS — H903 Sensorineural hearing loss, bilateral: Secondary | ICD-10-CM | POA: Diagnosis not present

## 2015-03-10 ENCOUNTER — Ambulatory Visit (INDEPENDENT_AMBULATORY_CARE_PROVIDER_SITE_OTHER): Payer: Medicare Other | Admitting: Podiatry

## 2015-03-10 ENCOUNTER — Encounter: Payer: Self-pay | Admitting: Podiatry

## 2015-03-10 DIAGNOSIS — B351 Tinea unguium: Secondary | ICD-10-CM

## 2015-03-10 DIAGNOSIS — E1140 Type 2 diabetes mellitus with diabetic neuropathy, unspecified: Secondary | ICD-10-CM

## 2015-03-10 DIAGNOSIS — M79673 Pain in unspecified foot: Secondary | ICD-10-CM

## 2015-03-10 NOTE — Progress Notes (Signed)
Patient ID: Antonio Buchanan, male   DOB: [birth year], 81 y.o.   MRN: [id number] Complaint:  Visit Type: Patient returns to my office for continued preventative foot care services. Complaint: Patient states" my nails have grown long and thick and become painful to walk and wear shoes" Patient has been diagnosed with DM with neuropathy and angiopathy.. The patient presents for preventative foot care services. No changes to ROS  Podiatric Exam: Vascular: dorsalis pedis and posterior tibial pulses are palpable bilateral. Capillary return is immediate. Temperature gradient is WNL. Skin turgor WNL  The left leg has redness swelling and red skin lesions left leg.   Sensorium: Diminished Semmes Weinstein monofilament test. Normal tactile sensation bilaterally. Nail Exam: Pt has thick disfigured discolored nails with subungual debris noted bilateral entire nail hallux through fifth toenails Ulcer Exam: There is no evidence of ulcer or pre-ulcerative changes or infection. Orthopedic Exam: Muscle tone and strength are WNL. No limitations in general ROM. No crepitus or effusions noted. Foot type and digits show no abnormalities. Bony prominences are unremarkable. Skin: No Porokeratosis. No infection or ulcers  Diagnosis:  Onychomycosis, , Pain in right toe, pain in left toes  Treatment & Plan Procedures and Treatment: Consent by patient was obtained for treatment procedures. The patient understood the discussion of treatment and procedures well. All questions were answered thoroughly reviewed. Debridement of mycotic and hypertrophic toenails, 1 through 5 bilateral and clearing of subungual debris. No ulceration, no infection noted.  Return Visit-Office Procedure: Patient instructed to return to the office for a follow up visit 3 months for continued evaluation and treatment.   Gardiner Barefoot DPM

## 2015-04-14 DIAGNOSIS — H7202 Central perforation of tympanic membrane, left ear: Secondary | ICD-10-CM | POA: Diagnosis not present

## 2015-04-14 DIAGNOSIS — H6612 Chronic tubotympanic suppurative otitis media, left ear: Secondary | ICD-10-CM | POA: Diagnosis not present

## 2015-04-14 DIAGNOSIS — H903 Sensorineural hearing loss, bilateral: Secondary | ICD-10-CM | POA: Diagnosis not present

## 2015-04-16 DIAGNOSIS — I10 Essential (primary) hypertension: Secondary | ICD-10-CM | POA: Diagnosis not present

## 2015-04-16 DIAGNOSIS — I872 Venous insufficiency (chronic) (peripheral): Secondary | ICD-10-CM | POA: Diagnosis not present

## 2015-04-16 DIAGNOSIS — L509 Urticaria, unspecified: Secondary | ICD-10-CM | POA: Diagnosis not present

## 2015-04-16 DIAGNOSIS — E875 Hyperkalemia: Secondary | ICD-10-CM | POA: Diagnosis not present

## 2015-04-16 DIAGNOSIS — E1151 Type 2 diabetes mellitus with diabetic peripheral angiopathy without gangrene: Secondary | ICD-10-CM | POA: Diagnosis not present

## 2015-04-16 DIAGNOSIS — Z6828 Body mass index (BMI) 28.0-28.9, adult: Secondary | ICD-10-CM | POA: Diagnosis not present

## 2015-04-23 DIAGNOSIS — L309 Dermatitis, unspecified: Secondary | ICD-10-CM | POA: Diagnosis not present

## 2015-04-23 DIAGNOSIS — I739 Peripheral vascular disease, unspecified: Secondary | ICD-10-CM | POA: Diagnosis not present

## 2015-04-23 DIAGNOSIS — Z6828 Body mass index (BMI) 28.0-28.9, adult: Secondary | ICD-10-CM | POA: Diagnosis not present

## 2015-04-23 DIAGNOSIS — I872 Venous insufficiency (chronic) (peripheral): Secondary | ICD-10-CM | POA: Diagnosis not present

## 2015-04-23 DIAGNOSIS — I10 Essential (primary) hypertension: Secondary | ICD-10-CM | POA: Diagnosis not present

## 2015-04-23 DIAGNOSIS — E875 Hyperkalemia: Secondary | ICD-10-CM | POA: Diagnosis not present

## 2015-05-04 DIAGNOSIS — D225 Melanocytic nevi of trunk: Secondary | ICD-10-CM | POA: Diagnosis not present

## 2015-05-04 DIAGNOSIS — I872 Venous insufficiency (chronic) (peripheral): Secondary | ICD-10-CM | POA: Diagnosis not present

## 2015-05-04 DIAGNOSIS — L309 Dermatitis, unspecified: Secondary | ICD-10-CM | POA: Diagnosis not present

## 2015-05-04 DIAGNOSIS — I8311 Varicose veins of right lower extremity with inflammation: Secondary | ICD-10-CM | POA: Diagnosis not present

## 2015-05-04 DIAGNOSIS — I8312 Varicose veins of left lower extremity with inflammation: Secondary | ICD-10-CM | POA: Diagnosis not present

## 2015-06-26 ENCOUNTER — Encounter: Payer: Self-pay | Admitting: Vascular Surgery

## 2015-07-07 ENCOUNTER — Ambulatory Visit (INDEPENDENT_AMBULATORY_CARE_PROVIDER_SITE_OTHER): Payer: Medicare Other | Admitting: Podiatry

## 2015-07-07 ENCOUNTER — Encounter: Payer: Self-pay | Admitting: Podiatry

## 2015-07-07 ENCOUNTER — Ambulatory Visit (INDEPENDENT_AMBULATORY_CARE_PROVIDER_SITE_OTHER): Payer: Medicare Other | Admitting: Vascular Surgery

## 2015-07-07 ENCOUNTER — Encounter: Payer: Self-pay | Admitting: Vascular Surgery

## 2015-07-07 VITALS — BP 154/73 | HR 50 | Temp 97.4°F | Resp 18 | Ht 64.5 in | Wt 161.2 lb

## 2015-07-07 DIAGNOSIS — B351 Tinea unguium: Secondary | ICD-10-CM

## 2015-07-07 DIAGNOSIS — E1140 Type 2 diabetes mellitus with diabetic neuropathy, unspecified: Secondary | ICD-10-CM | POA: Diagnosis not present

## 2015-07-07 DIAGNOSIS — M79673 Pain in unspecified foot: Secondary | ICD-10-CM | POA: Diagnosis not present

## 2015-07-07 DIAGNOSIS — I872 Venous insufficiency (chronic) (peripheral): Secondary | ICD-10-CM

## 2015-07-07 NOTE — Progress Notes (Signed)
[facility]  Patient name: Antonio Buchanan MRN: [id number] DOB: [birth year] Sex: male  REASON FOR VISIT: Follow up of venous hypertension  HPI: Antonio Buchanan is a 82 y.o. male here today for follow-up of venous hypertension of his left leg. He has a long history of this and has disability from the government related to this. He needs a yearly office visit to document this. Since my last visit with him he did have an episode of phlebitis and was admitted to the hospital for this. He had renal insufficiency was felt to be related to antibiotic therapy and this has resolved. He does look quite good today. He is here today with his son-in-law. He is extremely diligent regarding foot care. He quit smoking since my last visit with him. He still reports that he started age 40 and 89 but decided last year to quit. No other major medical problems  Past Medical History  Diagnosis Date  . Diabetes mellitus   . HTN (hypertension)   . Hyperlipemia   . Spinal stenosis   . Thalassemia   . History of Lyme disease   . PTSD (post-traumatic stress disorder)   . Hemorrhoids 02/03/2005    Internal and external  . Diverticulosis of colon (without mention of hemorrhage) 02/03/2005  . Personal history of colonic polyps   . Benign neoplasm of colon   . Polymyalgia rheumatica ([place])   . Varicose veins   . Aortic stenosis   . Cancer ([place])     left cheek  . Phlebitis 06-24-2014    left leg    Family History  Problem Relation Age of Onset  . Diabetes Father   . Leukemia Mother   . Hypertension Mother   . Arthritis Mother   . Diabetes Sister     SOCIAL HISTORY: Social History  Substance Use Topics  . Smoking status: Current Every Day Smoker -- 78 years    Types: Cigars  . Smokeless tobacco: Never Used     Comment: pt states that he does not inhale  . Alcohol Use: No    Allergies  Allergen Reactions  . Cephalosporins Hives and Itching  .  Lamisil Af Defense [Tolnaftate]     Severe hives    Current Outpatient Prescriptions  Medication Sig Dispense Refill  . amLODipine (NORVASC) 5 MG tablet Take 5 mg by mouth as needed (blood pressure).     Marland Kitchen atenolol (TENORMIN) 25 MG tablet Take 25 mg by mouth daily.    . benazepril (LOTENSIN) 40 MG tablet Take 40 mg by mouth daily.    Marland Kitchen buPROPion (WELLBUTRIN) 100 MG tablet Take 100 mg by mouth every evening.     . docusate sodium (COLACE) 100 MG capsule Take 100 mg by mouth daily.     Marland Kitchen escitalopram (LEXAPRO) 10 MG tablet Take 10 mg by mouth daily.    . fish oil-omega-3 fatty acids 1000 MG capsule Take 2 g by mouth every other day.     Marland Kitchen glipiZIDE (GLUCOTROL) 5 MG tablet Take 5 mg by mouth daily before breakfast.    . GRAPE SEED EXTRACT PO Take 1 tablet by mouth daily.    . Linaclotide (LINZESS) 290 MCG CAPS capsule Take 1 capsule (290 mcg total) by mouth daily. 30 capsule 5  . Melatonin 10 MG TABS Take 1 capsule by mouth daily.    . metFORMIN (GLUCOPHAGE) 1000 MG tablet Take 1,000 mg by mouth 2 (two) times daily with a meal.     .  multivitamin-lutein (OCUVITE-LUTEIN) CAPS capsule Take 1 capsule by mouth daily.    . polyethylene glycol (MIRALAX / GLYCOLAX) packet Take 17 g by mouth daily.      No current facility-administered medications for this visit.    REVIEW OF SYSTEMS:  [X]  denotes positive finding, [ ]  denotes negative finding Cardiac  Comments:  Chest pain or chest pressure:    Shortness of breath upon exertion:    Short of breath when lying flat:    Irregular heart rhythm:        Vascular    Pain in calf, thigh, or hip brought on by ambulation: x   Pain in feet at night that wakes you up from your sleep:  x   Blood clot in your veins: x   Leg swelling:  x       Pulmonary    Oxygen at home:    Productive cough:     Wheezing:         Neurologic    Sudden weakness in arms or legs:     Sudden numbness in arms or legs:     Sudden onset of difficulty speaking or  slurred speech:    Temporary loss of vision in one eye:     Problems with dizziness:         Gastrointestinal    Blood in stool:     Vomited blood:         Genitourinary    Burning when urinating:     Blood in urine:        Psychiatric    Major depression:  x       Hematologic    Bleeding problems:    Problems with blood clotting too easily:        Skin    Rashes or ulcers: x       Constitutional    Fever or chills:      PHYSICAL EXAM: Filed Vitals:   07/07/15 1509 07/07/15 1514  BP: 152/76 154/73  Pulse: 50   Temp: 97.4 F (36.3 C)   TempSrc: Oral   Resp: 18   Height: 5' 4.5" (1.638 m)   Weight: 161 lb 3.2 oz (73.12 kg)   SpO2: 98%     GENERAL: The patient is a well-nourished male, in no acute distress. The vital signs are documented above. VASCULAR: 2+ dorsalis pedis pulses on his left foot. PULMONARY: There is good air exchange  MUSCULOSKELETAL: There are no major deformities or cyanosis. NEUROLOGIC: No focal weakness or paresthesias are detected. SKIN: There are no ulcers or rashes noted. Does have marked changes of venous hypertension with discoloration and the telangiectasia. PSYCHIATRIC: The patient has a normal affect.   MEDICAL ISSUES: Will overall. Again had long discussion with patient. He has normal arterial flow and therefore is minimal risk for limb loss. Explain the continued importance of elevated and neck compression. His compression garments are quite worn and therefore he has purchased a new pair of knee-high 20-30 mmHg compression garments today and is very compliant in the use of these. We will see him again in one year for continued follow-up    Antonio Buchanan [facility] [employer]: [phone]

## 2015-07-07 NOTE — Progress Notes (Signed)
Patient ID: Antonio Buchanan, male   DOB: Aug 13, 1921, 82 y.o.   MRN: [id number] Complaint:  Visit Type: Patient returns to my office for continued preventative foot care services. Complaint: Patient states" my nails have grown long and thick and become painful to walk and wear shoes" Patient has been diagnosed with DM with neuropathy and angiopathy.. The patient presents for preventative foot care services. No changes to ROS  Podiatric Exam: Vascular: dorsalis pedis and posterior tibial pulses are palpable bilateral. Capillary return is immediate. Temperature gradient is WNL. Skin turgor WNL  The left leg has redness swelling and red skin lesions left leg.   Sensorium: Diminished Semmes Weinstein monofilament test. Normal tactile sensation bilaterally. Nail Exam: Pt has thick disfigured discolored nails with subungual debris noted bilateral entire nail hallux through fifth toenails Ulcer Exam: There is no evidence of ulcer or pre-ulcerative changes or infection. Orthopedic Exam: Muscle tone and strength are WNL. No limitations in general ROM. No crepitus or effusions noted. Foot type and digits show no abnormalities. Bony prominences are unremarkable. Skin: No Porokeratosis. No infection or ulcers  Diagnosis:  Onychomycosis, , Pain in right toe, pain in left toes  Treatment & Plan Procedures and Treatment: Consent by patient was obtained for treatment procedures. The patient understood the discussion of treatment and procedures well. All questions were answered thoroughly reviewed. Debridement of mycotic and hypertrophic toenails, 1 through 5 bilateral and clearing of subungual debris. No ulceration, no infection noted.  Return Visit-Office Procedure: Patient instructed to return to the office for a follow up visit 3 months for continued evaluation and treatment.   Gardiner Barefoot DPM

## 2015-07-07 NOTE — Progress Notes (Signed)
Filed Vitals:   07/07/15 1509 07/07/15 1514  BP: 152/76 154/73  Pulse: 50   Temp: 97.4 F (36.3 C)   TempSrc: Oral   Resp: 18   Height: 5' 4.5" (1.638 m)   Weight: 161 lb 3.2 oz (73.12 kg)   SpO2: 98%

## 2015-10-06 ENCOUNTER — Ambulatory Visit (INDEPENDENT_AMBULATORY_CARE_PROVIDER_SITE_OTHER): Payer: Medicare Other | Admitting: Podiatry

## 2015-10-06 ENCOUNTER — Encounter: Payer: Self-pay | Admitting: Podiatry

## 2015-10-06 DIAGNOSIS — M79673 Pain in unspecified foot: Secondary | ICD-10-CM | POA: Diagnosis not present

## 2015-10-06 DIAGNOSIS — B351 Tinea unguium: Secondary | ICD-10-CM | POA: Diagnosis not present

## 2015-10-06 DIAGNOSIS — E1140 Type 2 diabetes mellitus with diabetic neuropathy, unspecified: Secondary | ICD-10-CM

## 2015-10-06 NOTE — Progress Notes (Signed)
Patient ID: Antonio Buchanan, male   DOB: May 06, 1921, 82 y.o.   MRN: [id number] Complaint:  Visit Type: Patient returns to my office for continued preventative foot care services. Complaint: Patient states" my nails have grown long and thick and become painful to walk and wear shoes" Patient has been diagnosed with DM with neuropathy and angiopathy.. The patient presents for preventative foot care services. No changes to ROS  Podiatric Exam: Vascular: dorsalis pedis and posterior tibial pulses are palpable bilateral. Capillary return is immediate. Temperature gradient is WNL. Skin turgor WNL  The left leg has redness swelling and red skin lesions left leg.   Sensorium: Diminished Semmes Weinstein monofilament test. Normal tactile sensation bilaterally. Nail Exam: Pt has thick disfigured discolored nails with subungual debris noted bilateral entire nail hallux through fifth toenails Ulcer Exam: There is no evidence of ulcer or pre-ulcerative changes or infection. Orthopedic Exam: Muscle tone and strength are WNL. No limitations in general ROM. No crepitus or effusions noted. Foot type and digits show no abnormalities. Bony prominences are unremarkable. Skin: No Porokeratosis. No infection or ulcers.  Peeling and redness both heels  Diagnosis:  Onychomycosis, , Pain in right toe, pain in left toes  Treatment & Plan Procedures and Treatment: Consent by patient was obtained for treatment procedures. The patient understood the discussion of treatment and procedures well. All questions were answered thoroughly reviewed. Debridement of mycotic and hypertrophic toenails, 1 through 5 bilateral and clearing of subungual debris. No ulceration, no infection noted.  Return Visit-Office Procedure: Patient instructed to return to the office for a follow up visit 3 months for continued evaluation and treatment.   Gardiner Barefoot DPM

## 2015-10-13 ENCOUNTER — Telehealth: Payer: Self-pay | Admitting: Vascular Surgery

## 2015-10-13 NOTE — Telephone Encounter (Signed)
Pt's daughter, Minus Liberty, stopped by to drop off paperwork that needs to be filled out for Mr. Geigel' insurance carrier in relation to worker's comp. The letter from the [place] [employer] is asking for documentation from his last office visit and it's also asking for the time and date of his upcoming appointment and our address. When the document is complete with his prior appointment information attached, please call Minus Liberty so she can come pick it up. A DPR with her name is listed in his chart.  Elia's ph# [phone]  Thank you.

## 2015-12-14 ENCOUNTER — Other Ambulatory Visit: Payer: Self-pay | Admitting: Cardiology

## 2015-12-14 DIAGNOSIS — I350 Nonrheumatic aortic (valve) stenosis: Secondary | ICD-10-CM

## 2015-12-14 NOTE — Telephone Encounter (Signed)
Rx request sent to pharmacy.  

## 2015-12-17 DIAGNOSIS — Z23 Encounter for immunization: Secondary | ICD-10-CM | POA: Diagnosis not present

## 2015-12-30 NOTE — Progress Notes (Signed)
HPI: FU AS. Myoview in 2008 showed EF 59 and normal perfusion. There has been a question of severity of aortic stenosis in the past. Dobutamine echocardiogram in February 2015 was felt consistent with severe aortic stenosis; low normal LV function. Last echocardiogram November 2016 showed ejection fraction 99991111, grade 1 diastolic dysfunction and moderate aortic stenosis. Since last seen, the patient denies any dyspnea on exertion, orthopnea, PND, pedal edema, palpitations, syncope or chest pain.   Current Outpatient Prescriptions  Medication Sig Dispense Refill  . amLODipine (NORVASC) 5 MG tablet Take 5 mg by mouth as needed (blood pressure).     Marland Kitchen aspirin 81 MG tablet Take 81 mg by mouth daily.    Marland Kitchen atenolol (TENORMIN) 25 MG tablet Take 12.5 mg by mouth daily.     . benazepril (LOTENSIN) 40 MG tablet Take 20 mg by mouth daily.     Marland Kitchen buPROPion (WELLBUTRIN) 100 MG tablet Take 50 mg by mouth every evening.     . docusate sodium (COLACE) 100 MG capsule Take 100 mg by mouth daily.     Marland Kitchen escitalopram (LEXAPRO) 10 MG tablet Take 5 mg by mouth daily.     . fish oil-omega-3 fatty acids 1000 MG capsule Take 2 g by mouth every other day.     Marland Kitchen glipiZIDE (GLUCOTROL) 5 MG tablet Take 5 mg by mouth daily before breakfast.    . GRAPE SEED EXTRACT PO Take 1 tablet by mouth daily.    . Linaclotide (LINZESS) 290 MCG CAPS capsule Take 1 capsule (290 mcg total) by mouth daily. 30 capsule 5  . lisinopril (PRINIVIL,ZESTRIL) 2.5 MG tablet Take 1 tablet (2.5 mg total) by mouth daily. Please schedule appointment for refills. (Patient taking differently: Take 1.25 mg by mouth daily. Please schedule appointment for refills.) 90 tablet 0  . Melatonin 10 MG TABS Take 1 capsule by mouth daily.    . metFORMIN (GLUCOPHAGE) 1000 MG tablet Take 1,000 mg by mouth 2 (two) times daily with a meal.     . multivitamin-lutein (OCUVITE-LUTEIN) CAPS capsule Take 1 capsule by mouth daily.    . polyethylene glycol (MIRALAX /  GLYCOLAX) packet Take 17 g by mouth daily.      No current facility-administered medications for this visit.      Past Medical History:  Diagnosis Date  . Aortic stenosis   . Benign neoplasm of colon   . Cancer ([place])    left cheek  . Diabetes mellitus   . Diverticulosis of colon (without mention of hemorrhage) 02/03/2005  . Hemorrhoids 02/03/2005   Internal and external  . History of Lyme disease   . HTN (hypertension)   . Hyperlipemia   . Personal history of colonic polyps   . Phlebitis 06-24-2014   left leg  . Polymyalgia rheumatica ([place])   . PTSD (post-traumatic stress disorder)   . Spinal stenosis   . Thalassemia   . Varicose veins     Past Surgical History:  Procedure Laterality Date  . CATARACT EXTRACTION, BILATERAL    . HEMORROIDECTOMY    . KIDNEY STONE SURGERY      Social History   Social History  . Marital status: Married    Spouse name: N/A  . Number of children: 2  . Years of education: N/A   Occupational History  . retired Nature conservation officer    Social History Main Topics  . Smoking status: Current Every Day Smoker    Years: 78.00    Types: Cigars  .  Smokeless tobacco: Never Used     Comment: pt states that he does not inhale  . Alcohol use No  . Drug use: No  . Sexual activity: Not on file   Other Topics Concern  . Not on file   Social History Narrative  . No narrative on file    Family History  Problem Relation Age of Onset  . Diabetes Father   . Leukemia Mother   . Hypertension Mother   . Arthritis Mother   . Diabetes Sister     ROS: no fevers or chills, productive cough, hemoptysis, dysphasia, odynophagia, melena, hematochezia, dysuria, hematuria, rash, seizure activity, orthopnea, PND, pedal edema, claudication. Remaining systems are negative.  Physical Exam: Well-developed well-nourished in no acute distress.  Skin is warm and dry.  HEENT is normal.  Neck is supple.  Chest is clear to auscultation with normal expansion.    Cardiovascular exam is regular rate and rhythm. 2/6 systolic murmur LSB; S2 is diminished Abdominal exam nontender or distended. No masses palpated. Extremities show Chronic skin changes left lower extremity neuro grossly intact  ECG-Sinus rhythm with first-degree AV block, cannot rule out prior septal infarct. Nonspecific ST changes.  A/P  1 aortic stenosis-moderate on most recent echocardiogram although there has been some discrepancy in the past. He remains asymptomatic with no chest pain, dyspnea or syncope. We will plan to repeat echocardiogram. Given age I would like to avoid procedures if possible although could consider TAVR if necessary.   2 hypertension-blood pressure is mildly elevated. However he is only taking 20 mg of benazepril instead of prescribed 40 mg. He is also taking lisinopril 2.5 mg daily. Discontinue lisinopril and increase benazepril to 40 mg daily. Check BMET one week.  3 cardiomyopathy-previous LV function decreased but most recent echocardiogram shows improvement. Continue ACE inhibitor and low dose beta blocker.   Kirk Ruths, MD

## 2016-01-01 ENCOUNTER — Ambulatory Visit (INDEPENDENT_AMBULATORY_CARE_PROVIDER_SITE_OTHER): Payer: Medicare Other | Admitting: Cardiology

## 2016-01-01 ENCOUNTER — Encounter: Payer: Self-pay | Admitting: Cardiology

## 2016-01-01 VITALS — BP 156/78 | HR 61 | Ht 64.0 in | Wt 160.0 lb

## 2016-01-01 DIAGNOSIS — I350 Nonrheumatic aortic (valve) stenosis: Secondary | ICD-10-CM

## 2016-01-01 DIAGNOSIS — I420 Dilated cardiomyopathy: Secondary | ICD-10-CM

## 2016-01-01 DIAGNOSIS — I10 Essential (primary) hypertension: Secondary | ICD-10-CM | POA: Diagnosis not present

## 2016-01-01 MED ORDER — BENAZEPRIL HCL 40 MG PO TABS: 40.0000 mg | ORAL_TABLET | Freq: Every day | ORAL | 3 refills | Status: DC

## 2016-01-01 NOTE — Patient Instructions (Signed)
Medication Instructions:   INCREASE BENAZEPRIL TO 40 MG ONCE DAILY  Labwork:  Your physician recommends that you return for lab work IN ONE WEEK  Testing/Procedures:  Your physician has requested that you have an echocardiogram. Echocardiography is a painless test that uses sound waves to create images of your heart. It provides your doctor with information about the size and shape of your heart and how well your heart's chambers and valves are working. This procedure takes approximately one hour. There are no restrictions for this procedure.    Follow-Up:  Your physician wants you to follow-up in: [place] will receive a reminder letter in the mail two months in advance. If you don't receive a letter, please call our office to schedule the follow-up appointment.   If you need a refill on your cardiac medications before your next appointment, please call your pharmacy.

## 2016-01-11 ENCOUNTER — Other Ambulatory Visit: Payer: Self-pay | Admitting: *Deleted

## 2016-01-11 MED ORDER — ATENOLOL 25 MG PO TABS: 12.5000 mg | ORAL_TABLET | Freq: Every day | ORAL | 0 refills | Status: DC

## 2016-01-11 MED ORDER — ATENOLOL 25 MG PO TABS: 12.5000 mg | ORAL_TABLET | Freq: Every day | ORAL | 3 refills | Status: DC

## 2016-01-12 ENCOUNTER — Other Ambulatory Visit: Payer: Self-pay | Admitting: Cardiology

## 2016-01-12 ENCOUNTER — Other Ambulatory Visit: Payer: Self-pay | Admitting: *Deleted

## 2016-01-12 MED ORDER — ATENOLOL 25 MG PO TABS: 12.5000 mg | ORAL_TABLET | Freq: Every day | ORAL | 2 refills | Status: DC

## 2016-01-12 MED ORDER — METOPROLOL SUCCINATE ER 25 MG PO TB24: 25.0000 mg | ORAL_TABLET | Freq: Every day | ORAL | 3 refills | Status: DC

## 2016-01-12 NOTE — Telephone Encounter (Signed)
Pt daughter is calling because father medicine Atenolol 25mg  is on back order at his current pharmacy and she wants to know if she can get a week supply or have a new RX sent to another pharmacy. Or maybe if we have samples available. He totally out of the medication now. He normally gets a 90 day supply

## 2016-01-12 NOTE — Telephone Encounter (Signed)
Discussed with pharmacist - Erasmo Downer. Order received may discontinue atenolol 25 mg (1/2 tablet ) which equal 12.5 mg dose daily Start metoprolol succinate  25 mg  (1/2 tablet) equal 12.5mg  dose daily  Contacted patient 's daughter. Instruction given , new prescription e-sent to mail order , daughter request . Daughter states she has medication until new bottle comes from mail order.

## 2016-01-15 ENCOUNTER — Telehealth: Payer: Self-pay | Admitting: Cardiology

## 2016-01-15 ENCOUNTER — Telehealth: Payer: Self-pay

## 2016-01-15 NOTE — Telephone Encounter (Signed)
Received a form from [facility] stating patient is on Metoprolol and Atenolol. Gave patient a call and patient stated he is only taking metoprolol. And that Atenolol has been discontinued and off the market.

## 2016-01-15 NOTE — Telephone Encounter (Signed)
Please call,pt wants a lab order for November.

## 2016-01-15 NOTE — Telephone Encounter (Signed)
Spoke with daughter she states that she spoke with pt and stated she wanted to verify that pt needed lab work done, informed daughter of lab work pending and she will take pt accordingly for lab work

## 2016-01-15 NOTE — Telephone Encounter (Signed)
New message  ° ° °Daughter calling back to speak with nurse.   °

## 2016-01-15 NOTE — Telephone Encounter (Signed)
Spoke with pt, lab orders mailed to the patient.

## 2016-01-20 ENCOUNTER — Other Ambulatory Visit (HOSPITAL_COMMUNITY): Payer: Medicare Other

## 2016-01-20 ENCOUNTER — Other Ambulatory Visit: Payer: Self-pay

## 2016-01-20 ENCOUNTER — Ambulatory Visit (HOSPITAL_COMMUNITY): Payer: Medicare Other | Attending: Cardiovascular Disease

## 2016-01-20 DIAGNOSIS — I059 Rheumatic mitral valve disease, unspecified: Secondary | ICD-10-CM | POA: Insufficient documentation

## 2016-01-20 DIAGNOSIS — Z720 Tobacco use: Secondary | ICD-10-CM | POA: Insufficient documentation

## 2016-01-20 DIAGNOSIS — I071 Rheumatic tricuspid insufficiency: Secondary | ICD-10-CM | POA: Diagnosis not present

## 2016-01-20 DIAGNOSIS — E119 Type 2 diabetes mellitus without complications: Secondary | ICD-10-CM | POA: Diagnosis not present

## 2016-01-20 DIAGNOSIS — I350 Nonrheumatic aortic (valve) stenosis: Secondary | ICD-10-CM | POA: Diagnosis not present

## 2016-01-20 DIAGNOSIS — I119 Hypertensive heart disease without heart failure: Secondary | ICD-10-CM | POA: Diagnosis not present

## 2016-01-20 DIAGNOSIS — I77810 Thoracic aortic ectasia: Secondary | ICD-10-CM | POA: Diagnosis not present

## 2016-01-20 DIAGNOSIS — E785 Hyperlipidemia, unspecified: Secondary | ICD-10-CM | POA: Insufficient documentation

## 2016-01-26 ENCOUNTER — Encounter: Payer: Self-pay | Admitting: Podiatry

## 2016-01-26 ENCOUNTER — Ambulatory Visit (INDEPENDENT_AMBULATORY_CARE_PROVIDER_SITE_OTHER): Payer: Medicare Other | Admitting: Podiatry

## 2016-01-26 VITALS — Ht 64.0 in | Wt 160.0 lb

## 2016-01-26 DIAGNOSIS — M79676 Pain in unspecified toe(s): Secondary | ICD-10-CM | POA: Diagnosis not present

## 2016-01-26 DIAGNOSIS — E1140 Type 2 diabetes mellitus with diabetic neuropathy, unspecified: Secondary | ICD-10-CM

## 2016-01-26 DIAGNOSIS — B351 Tinea unguium: Secondary | ICD-10-CM

## 2016-01-26 DIAGNOSIS — L309 Dermatitis, unspecified: Secondary | ICD-10-CM

## 2016-01-26 MED ORDER — AMMONIUM LACTATE 12 % EX CREA: TOPICAL_CREAM | CUTANEOUS | 0 refills | Status: DC | PRN

## 2016-01-26 NOTE — Progress Notes (Signed)
Patient ID: Antonio Buchanan, male   DOB: 1921-09-14, 82 y.o.   MRN: [id number] Complaint:  Visit Type: Patient returns to my office for continued preventative foot care services. Complaint: Patient states" my nails have grown long and thick and become painful to walk and wear shoes" Patient has been diagnosed with DM with neuropathy and angiopathy.. The patient presents for preventative foot care services. No changes to ROS  Podiatric Exam: Vascular: dorsalis pedis and posterior tibial pulses are palpable bilateral. Capillary return is immediate. Temperature gradient is WNL. Skin turgor WNL  The left leg has redness swelling and red skin lesions left leg.   Sensorium: Diminished Semmes Weinstein monofilament test. Normal tactile sensation bilaterally. Nail Exam: Pt has thick disfigured discolored nails with subungual debris noted bilateral entire nail hallux through fifth toenails Ulcer Exam: There is no evidence of ulcer or pre-ulcerative changes or infection. Orthopedic Exam: Muscle tone and strength are WNL. No limitations in general ROM. No crepitus or effusions noted. Foot type and digits show no abnormalities. Bony prominences are unremarkable. Skin: No Porokeratosis. No infection or ulcers.  Peeling and redness both heels.  Diagnosis:  Onychomycosis, , Pain in right toe, pain in left toes  Treatment & Plan Procedures and Treatment: Consent by patient was obtained for treatment procedures. The patient understood the discussion of treatment and procedures well. All questions were answered thoroughly reviewed. Debridement of mycotic and hypertrophic toenails, 1 through 5 bilateral and clearing of subungual debris. No ulceration, no infection noted. Prescirbe lac-hydrin.  Return Visit-Office Procedure: Patient instructed to return to the office for a follow up visit 3 months for continued evaluation and treatment.   Gardiner Barefoot DPM

## 2016-02-18 ENCOUNTER — Other Ambulatory Visit: Payer: Medicare Other

## 2016-02-18 ENCOUNTER — Other Ambulatory Visit: Payer: Self-pay | Admitting: Cardiology

## 2016-02-18 DIAGNOSIS — I350 Nonrheumatic aortic (valve) stenosis: Secondary | ICD-10-CM | POA: Diagnosis not present

## 2016-02-18 LAB — BASIC METABOLIC PANEL
BUN/Creatinine Ratio: 21 (ref 10–24)
BUN: 29 mg/dL (ref 10–36)
CHLORIDE: 105 mmol/L (ref 96–106)
CO2: 25 mmol/L (ref 18–29)
Calcium: 8.9 mg/dL (ref 8.6–10.2)
Creatinine, Ser: 1.35 mg/dL — ABNORMAL HIGH (ref 0.76–1.27)
GFR calc Af Amer: 52 mL/min/{1.73_m2} — ABNORMAL LOW (ref >59)
GFR, EST NON AFRICAN AMERICAN: 45 mL/min/{1.73_m2} — AB (ref >59)
Glucose: 191 mg/dL — ABNORMAL HIGH (ref 65–99)
POTASSIUM: 5.3 mmol/L — AB (ref 3.5–5.2)
Sodium: 139 mmol/L (ref 134–144)

## 2016-04-28 DIAGNOSIS — M353 Polymyalgia rheumatica: Secondary | ICD-10-CM | POA: Diagnosis not present

## 2016-04-28 DIAGNOSIS — I10 Essential (primary) hypertension: Secondary | ICD-10-CM | POA: Diagnosis not present

## 2016-04-28 DIAGNOSIS — I739 Peripheral vascular disease, unspecified: Secondary | ICD-10-CM | POA: Diagnosis not present

## 2016-04-28 DIAGNOSIS — F419 Anxiety disorder, unspecified: Secondary | ICD-10-CM | POA: Diagnosis not present

## 2016-04-28 DIAGNOSIS — E1151 Type 2 diabetes mellitus with diabetic peripheral angiopathy without gangrene: Secondary | ICD-10-CM | POA: Diagnosis not present

## 2016-04-28 DIAGNOSIS — Z6828 Body mass index (BMI) 28.0-28.9, adult: Secondary | ICD-10-CM | POA: Diagnosis not present

## 2016-05-06 DIAGNOSIS — E875 Hyperkalemia: Secondary | ICD-10-CM | POA: Diagnosis not present

## 2016-05-24 ENCOUNTER — Encounter: Payer: Self-pay | Admitting: Podiatry

## 2016-05-24 ENCOUNTER — Ambulatory Visit (INDEPENDENT_AMBULATORY_CARE_PROVIDER_SITE_OTHER): Payer: Medicare Other | Admitting: Podiatry

## 2016-05-24 DIAGNOSIS — M79676 Pain in unspecified toe(s): Secondary | ICD-10-CM

## 2016-05-24 DIAGNOSIS — E1140 Type 2 diabetes mellitus with diabetic neuropathy, unspecified: Secondary | ICD-10-CM

## 2016-05-24 DIAGNOSIS — B351 Tinea unguium: Secondary | ICD-10-CM | POA: Diagnosis not present

## 2016-05-24 NOTE — Progress Notes (Signed)
Patient ID: Antonio Buchanan, male   DOB: [birth year], 83 y.o.   MRN: [id number] Complaint:  Visit Type: Patient returns to my office for continued preventative foot care services. Complaint: Patient states" my nails have grown long and thick and become painful to walk and wear shoes" Patient has been diagnosed with DM with neuropathy . The patient presents for preventative foot care services. No changes to ROS  Podiatric Exam: Vascular: dorsalis pedis and posterior tibial pulses are palpable bilateral. Capillary return is immediate. Temperature gradient is WNL. Skin turgor WNL  The left leg has redness swelling and red skin lesions left leg. Venous stasis left leg.  Sensorium: Diminished Semmes Weinstein monofilament test. Normal tactile sensation bilaterally. Nail Exam: Pt has thick disfigured discolored nails with subungual debris noted bilateral entire nail hallux through fifth toenails Ulcer Exam: There is no evidence of ulcer or pre-ulcerative changes or infection. Orthopedic Exam: Muscle tone and strength are WNL. No limitations in general ROM. No crepitus or effusions noted. HAV  B/L. Skin: No Porokeratosis. No infection or ulcers.  Peeling and redness both heels.  Diagnosis:  Onychomycosis, , Pain in right toe, pain in left toes  Treatment & Plan Procedures and Treatment: Consent by patient was obtained for treatment procedures. The patient understood the discussion of treatment and procedures well. All questions were answered thoroughly reviewed. Debridement of mycotic and hypertrophic toenails, 1 through 5 bilateral and clearing of subungual debris. No ulceration, no infection noted. Initiate diabetic paperwork for DPN and HAV Return Visit-Office Procedure: Patient instructed to return to the office for a follow up visit 3 months for continued evaluation and treatment.   Gardiner Barefoot DPM

## 2016-06-09 DIAGNOSIS — Z8739 Personal history of other diseases of the musculoskeletal system and connective tissue: Secondary | ICD-10-CM | POA: Insufficient documentation

## 2016-06-09 DIAGNOSIS — M19042 Primary osteoarthritis, left hand: Secondary | ICD-10-CM

## 2016-06-09 DIAGNOSIS — Z8639 Personal history of other endocrine, nutritional and metabolic disease: Secondary | ICD-10-CM | POA: Insufficient documentation

## 2016-06-09 DIAGNOSIS — Z862 Personal history of diseases of the blood and blood-forming organs and certain disorders involving the immune mechanism: Secondary | ICD-10-CM | POA: Insufficient documentation

## 2016-06-09 DIAGNOSIS — M19041 Primary osteoarthritis, right hand: Secondary | ICD-10-CM | POA: Insufficient documentation

## 2016-06-09 DIAGNOSIS — M353 Polymyalgia rheumatica: Secondary | ICD-10-CM | POA: Insufficient documentation

## 2016-06-09 DIAGNOSIS — M47816 Spondylosis without myelopathy or radiculopathy, lumbar region: Secondary | ICD-10-CM | POA: Insufficient documentation

## 2016-06-09 DIAGNOSIS — Z8669 Personal history of other diseases of the nervous system and sense organs: Secondary | ICD-10-CM | POA: Insufficient documentation

## 2016-06-09 DIAGNOSIS — I872 Venous insufficiency (chronic) (peripheral): Secondary | ICD-10-CM | POA: Insufficient documentation

## 2016-06-09 DIAGNOSIS — M5030 Other cervical disc degeneration, unspecified cervical region: Secondary | ICD-10-CM | POA: Insufficient documentation

## 2016-06-09 NOTE — Progress Notes (Signed)
Office Visit Note  Patient: Antonio Buchanan             Date of Birth: 09-May-1921           MRN: [id number]             PCP: Donnajean Lopes, MD Referring: Leanna Battles, MD Visit Date: 06/16/2016 Occupation: '@[email or web address]' @    Subjective:  Muscle pain.   History of Present Illness: Antonio Buchanan is a 83 y.o. male seen in consultation per request of his PCP. Patient was initially seen by me in April 2014 and was diagnosed with polymyalgia rheumatica. He was started on prednisone taper. He gradually came off prednisone over several months and was under my care until January 2015. He also has long-standing history of disc disease of lumbar spine and spinal stenosis. He has had several falls and lower extremity weakness. In 2005 he was diagnosed with Lyme disease while he was living in [place] and was treated with IV antibiotics for a long time. In 2007 he fell and fractured his fibula and had persistent pain since then. He was under care of Dr. Mayer Camel for this disease of C-spine and lumbar spine. In January 2014 he developed a statin-induced myopathy and was taken off Zocor. In March 2014 he developed swelling in his hands. On exam he was noted to have proximal muscle weakness and tenderness and was diagnosed with polymyalgia rheumatica. He was started on prednisone 15 mg by mouth daily and taper gradually. All his autoimmune workup was negative. He went into remission after finishing his prednisone dose in 2015. He was accompanied by his daughter today. According to him and his symptoms a started back in February 2018. At that time he was complaining of increased pain in his arms and lower extremities. He was seen at Dr. Amie Critchley office and had some blood work. I do not have his initial labs. He was placed on prednisone 10 mg by mouth daily for 2 weeks and then decrease to 9 mg by mouth daily and now he is on 8 mg by mouth daily. He responded very well to prednisone. He  still having some discomfort in his left knee joint. He denies any discomfort in his neck and lower back.  Activities of Daily Living:  Patient reports morning stiffness for 0 minute.   Patient Denies nocturnal pain.  Difficulty dressing/grooming: Denies Difficulty climbing stairs: Reports Difficulty getting out of chair: Reports Difficulty using hands for taps, buttons, cutlery, and/or writing: Denies   Review of Systems  Constitutional: Positive for fatigue. Negative for night sweats and weakness ( ).  HENT: Negative for mouth sores, mouth dryness and nose dryness.   Eyes: Negative for redness and dryness.  Respiratory: Negative for shortness of breath and difficulty breathing.   Cardiovascular: Positive for hypertension. Negative for chest pain, palpitations, irregular heartbeat and swelling in legs/feet.  Gastrointestinal: Negative for constipation and diarrhea.  Endocrine: Negative for increased urination.  Musculoskeletal: Positive for arthralgias, joint pain, myalgias, morning stiffness and myalgias. Negative for joint swelling, muscle weakness and muscle tenderness.  Skin: Negative for color change, rash, hair loss, nodules/bumps, skin tightness, ulcers and sensitivity to sunlight.  Allergic/Immunologic: Negative for susceptible to infections.  Neurological: Negative for dizziness, fainting, memory loss and night sweats.  Hematological: Negative for swollen glands.  Psychiatric/Behavioral: Positive for depressed mood and sleep disturbance. The patient is nervous/anxious.     PMFS History:  Patient Active Problem List   Diagnosis Date Noted  . Polymyalgia rheumatica (  [place]) 06/09/2016  . Spondylosis of lumbar region without myelopathy or radiculopathy 06/09/2016  . DJD (degenerative joint disease), cervical 06/09/2016  . Primary osteoarthritis of both hands 06/09/2016  . History of scoliosis 06/09/2016  . Venous insufficiency 06/09/2016  . History of thalassemia 06/09/2016    . Rash 09/08/2014  . ARF (acute renal failure) ([place]) 09/08/2014  . Venous stasis dermatitis of left lower extremity 09/08/2014  . Dehydration 09/08/2014  . Allergic reaction caused by a drug 09/08/2014  . Congestive dilated cardiomyopathy ([place]) 08/29/2014  . Chronic constipation 03/26/2012  . Aortic stenosis 03/01/2012  . Chronic venous hypertension without complications 14/48/1856  . Murmur 02/24/2011  . Tobacco abuse 02/24/2011  . Diabetes mellitus ([place])   . HTN (hypertension)   . Hyperlipemia   . Spinal stenosis   . Thalassemia   . History of Lyme disease   . PTSD (post-traumatic stress disorder)   . GERD 04/06/2009  . NAUSEA 04/06/2009  . FLATULENCE-GAS-BLOATING 04/06/2009  . ABDOMINAL PAIN-EPIGASTRIC 04/06/2009    Past Medical History:  Diagnosis Date  . Aortic stenosis   . Benign neoplasm of colon   . Cancer ([place])    left cheek  . Diabetes mellitus   . Diverticulosis of colon (without mention of hemorrhage) 02/03/2005  . Hemorrhoids 02/03/2005   Internal and external  . History of Lyme disease   . HTN (hypertension)   . Hyperlipemia   . Personal history of colonic polyps   . Phlebitis 06-24-2014   left leg  . Polymyalgia rheumatica ([place])   . PTSD (post-traumatic stress disorder)   . Spinal stenosis   . Thalassemia   . Varicose veins     Family History  Problem Relation Age of Onset  . Diabetes Father   . Leukemia Mother   . Hypertension Mother   . Arthritis Mother   . Diabetes Sister    Past Surgical History:  Procedure Laterality Date  . CATARACT EXTRACTION, BILATERAL    . HEMORROIDECTOMY    . KIDNEY STONE SURGERY     Social History   Social History Narrative  . No narrative on file     Objective: Vital Signs: There were no vitals taken for this visit.   Physical Exam  Constitutional: He is oriented to person, place, and time. He appears well-developed and well-nourished.  HENT:  Head: Normocephalic and atraumatic.  Eyes: Conjunctivae  and EOM are normal. Pupils are equal, round, and reactive to light.  Neck: Normal range of motion. Neck supple.  Cardiovascular: Normal rate, regular rhythm and normal heart sounds.   Pulmonary/Chest: Effort normal and breath sounds normal.  Abdominal: Soft. Bowel sounds are normal.  Neurological: He is alert and oriented to person, place, and time.  Skin: Skin is warm and dry. Capillary refill takes less than 2 seconds.  Psychiatric: He has a normal mood and affect. His behavior is normal.  Nursing note and vitals reviewed.    Musculoskeletal Exam: C-spine good range of motion lumbar spine limited range of motion with pain. Shoulder joints are good range of motion his elbow joint extension was incomplete. Wrist joints are good range of motion there was some PIP/DIP thickening in the hands consistent with osteoarthritis. Hip joints are good range of motion his left knee joint has incomplete extension. No warmth swelling or effusion was noted. Ankles and MTPs PIPs are good range of motion with no synovitis.  CDAI Exam: No CDAI exam completed.    Investigation: Findings:  March 2014:  X-ray of  the C-spine showed C5-6 severe narrowing.  March 2014:  Left wrist joint showed CMC narrowing.  March 2014:  MRI of his C-spine showed disk disease and mild spinal stenosis.  MRI of the lumbar spine showed in 2010 disk disease and severe spinal stenosis.   06/2012 I obtained x-rays of bilateral hands, AP and oblique views, which showed left intercarpal joint space narrowing, metacarpocarpal joint space narrowing, left 2nd and 3rd MCP narrowing without any erosive changes, and 2nd and 3rd PIP and DIP narrowing.  Severe narrowing with some ankylosis of several of his PIP joints.  These findings could be consistent with inflammatory osteoarthritis, spondyloarthropathy, or rheumatoid arthritis.  Labs from 06/26/2012 show CCP negative, rheumatoid factor negative, vitamin D  at 43.  SPEP M-spike is negative.  ANA  is negative.  CBC is normal except for mild anemia.  Sed rate is elevated at 49 but may be related to the decreased red blood cell hemoglobin count. CMP is normal except sodium is low at 129.  Glucose is elevated at 310.  Serum immunoglobulin fixation test was requested, but test was not performed.  Hepatitis panel is negative.  G6PD is negative.  ANA is negative.  HLA-B27 is negative.  05/06/2016 BMP potassium 5.6, glucose 213 04/28/2016 CBC normal    Imaging: No results found.  Speciality Comments: No specialty comments available.    Procedures:  No procedures performed Allergies: Cephalosporins and Lamisil af defense [tolnaftate]   Assessment / Plan:     Visit Diagnoses: Polymyalgia rheumatica ([place]): Patient has some known history of polymyalgia in the past and had been in remission for a long time. He had recurrence of symptoms in the beginning of January. He was started on prednisone by his PCP and had been tapering it. He is currently down to 8 mg by mouth daily. On clinical examination today did not have any features of polymyalgia seems to have responded very well to prednisone. We had detailed discussion regarding polymyalgia rheumatica and tapering prednisone by 1 mg every month now. We discussed that at length and also the whole a schedule was given to him. Prednisone 5 mg and 1 mg tablets were called in. He will continue to get his labs monitored through Dr. Buel Ream office.  Left knee joint discomfort: He has limited extension. I offered x-rays but he declined. He probably has some underlying osteoarthritis.   Spondylosis of lumbar region without myelopathy or radiculopathy: He is not having a lot of symptoms currently  DJD (degenerative joint disease), cervical: Decreased range of motion  Primary osteoarthritis of both hands and joint protection and muscle strengthening discussed.  History of scoliosis  Venous insufficiency  History of diabetes mellitus  History  of Lyme disease - treated with IV ABX   History of thalassemia    Orders: No orders of the defined types were placed in this encounter.  No orders of the defined types were placed in this encounter.   Face-to-face time spent with patient was 50 minutes. 50% of time was spent in counseling and coordination of care.  Follow-Up Instructions: No Follow-up on file.   Amy Littrell, RT  Note - This record has been created using [employer].  Chart creation errors have been sought, but may not always  have been located. Such creation errors do not reflect on  the standard of medical care.

## 2016-06-16 ENCOUNTER — Encounter: Payer: Self-pay | Admitting: Rheumatology

## 2016-06-16 ENCOUNTER — Ambulatory Visit (INDEPENDENT_AMBULATORY_CARE_PROVIDER_SITE_OTHER): Payer: Medicare Other | Admitting: Rheumatology

## 2016-06-16 VITALS — BP 139/79 | HR 68 | Resp 12 | Ht 65.0 in | Wt 164.0 lb

## 2016-06-16 DIAGNOSIS — I350 Nonrheumatic aortic (valve) stenosis: Secondary | ICD-10-CM

## 2016-06-16 DIAGNOSIS — Z8619 Personal history of other infectious and parasitic diseases: Secondary | ICD-10-CM

## 2016-06-16 DIAGNOSIS — M19042 Primary osteoarthritis, left hand: Secondary | ICD-10-CM

## 2016-06-16 DIAGNOSIS — Z862 Personal history of diseases of the blood and blood-forming organs and certain disorders involving the immune mechanism: Secondary | ICD-10-CM

## 2016-06-16 DIAGNOSIS — M19041 Primary osteoarthritis, right hand: Secondary | ICD-10-CM

## 2016-06-16 DIAGNOSIS — M25562 Pain in left knee: Secondary | ICD-10-CM

## 2016-06-16 DIAGNOSIS — M47816 Spondylosis without myelopathy or radiculopathy, lumbar region: Secondary | ICD-10-CM

## 2016-06-16 DIAGNOSIS — M5030 Other cervical disc degeneration, unspecified cervical region: Secondary | ICD-10-CM

## 2016-06-16 DIAGNOSIS — Z8739 Personal history of other diseases of the musculoskeletal system and connective tissue: Secondary | ICD-10-CM | POA: Diagnosis not present

## 2016-06-16 DIAGNOSIS — M353 Polymyalgia rheumatica: Secondary | ICD-10-CM

## 2016-06-16 DIAGNOSIS — Z8639 Personal history of other endocrine, nutritional and metabolic disease: Secondary | ICD-10-CM

## 2016-06-16 DIAGNOSIS — I872 Venous insufficiency (chronic) (peripheral): Secondary | ICD-10-CM | POA: Diagnosis not present

## 2016-06-16 DIAGNOSIS — Z8669 Personal history of other diseases of the nervous system and sense organs: Secondary | ICD-10-CM

## 2016-06-16 DIAGNOSIS — M47812 Spondylosis without myelopathy or radiculopathy, cervical region: Secondary | ICD-10-CM

## 2016-06-16 MED ORDER — PREDNISONE 1 MG PO TABS: 1.0000 mg | ORAL_TABLET | Freq: Every day | ORAL | 0 refills | Status: DC

## 2016-06-16 MED ORDER — PREDNISONE 5 MG PO TABS: 5.0000 mg | ORAL_TABLET | Freq: Every day | ORAL | 0 refills | Status: DC

## 2016-06-16 NOTE — Patient Instructions (Signed)
Continue prednisone 8 mg daily for one month.  Then decrease prednisone dose by 1 mg every month.

## 2016-06-17 ENCOUNTER — Encounter: Payer: Self-pay | Admitting: Pharmacist

## 2016-06-17 NOTE — Progress Notes (Signed)
Pharmacy Note I spoke to patient and his daughter during 06/16/16 office visit.  Patient is currently taking prednisone 8 mg daily and was prescribed prednisone taper for PMR.  I reviewed instructions of the prednisone taper with patient and daughter.  He should continue prednisone 8 mg daily for one month total, then reduce dose to 7 mg daily for one month, then continue to reduce prednisone by 1 mg each month.  Patient voiced understanding and confirms he plans to get a calendar to keep track of when he is due to reduce prednisone dose.  I counseled patient on the purpose, proper use, and adverse effects of prednisone.  Advised patient of importance of monitoring his blood pressure and blood sugar closely while on prednisone and keeping close follow up with PCP for both of these.  Patient's daughter will confirm that patient has appointment with PCP.  Also advised patient to take prednisone in the morning with food.  Patient denies any further questions or concerns regarding his medications at this time.    Elisabeth Most, Pharm.D., BCPS, CPP Clinical Pharmacist Pager: [phone] Phone: [phone] 06/17/2016 7:29 AM

## 2016-06-27 DIAGNOSIS — L821 Other seborrheic keratosis: Secondary | ICD-10-CM | POA: Diagnosis not present

## 2016-06-27 DIAGNOSIS — L308 Other specified dermatitis: Secondary | ICD-10-CM | POA: Diagnosis not present

## 2016-06-30 ENCOUNTER — Encounter: Payer: Self-pay | Admitting: Vascular Surgery

## 2016-07-11 ENCOUNTER — Telehealth: Payer: Self-pay | Admitting: Radiology

## 2016-07-11 ENCOUNTER — Telehealth (INDEPENDENT_AMBULATORY_CARE_PROVIDER_SITE_OTHER): Payer: Self-pay | Admitting: Rheumatology

## 2016-07-11 NOTE — Telephone Encounter (Signed)
Patients daughter states father has appt for his knee with [facility]. Was worried about this. Just wants advice on whom patient would be best to see. Daughter [phone]

## 2016-07-11 NOTE — Telephone Encounter (Signed)
I can not advised them whether or not to go to this clinic Dr Estanislado Pandy has not seen patient for PMR since 2015. I have called daughter, if he has trouble with PMR or knees, can make appt with Dr D would be happy to see him.

## 2016-07-11 NOTE — Telephone Encounter (Signed)
Patient is c/o  Knees painful would like to make appointment.

## 2016-07-12 ENCOUNTER — Ambulatory Visit: Payer: Medicare Other | Admitting: Vascular Surgery

## 2016-07-12 NOTE — Telephone Encounter (Signed)
Ok to schedule, per Amy. Tried calling daughter to schedule appt and her voicemail box was full. Will try again later today.

## 2016-07-12 NOTE — Telephone Encounter (Signed)
Patient was last seen by Dr. Estanislado Pandy on 04/11/13. Ok to schedule since it has been over 3 years?

## 2016-07-15 DIAGNOSIS — Z1389 Encounter for screening for other disorder: Secondary | ICD-10-CM | POA: Diagnosis not present

## 2016-07-15 DIAGNOSIS — I10 Essential (primary) hypertension: Secondary | ICD-10-CM | POA: Diagnosis not present

## 2016-07-15 DIAGNOSIS — M48061 Spinal stenosis, lumbar region without neurogenic claudication: Secondary | ICD-10-CM | POA: Diagnosis not present

## 2016-07-15 DIAGNOSIS — I350 Nonrheumatic aortic (valve) stenosis: Secondary | ICD-10-CM | POA: Diagnosis not present

## 2016-07-15 DIAGNOSIS — M353 Polymyalgia rheumatica: Secondary | ICD-10-CM | POA: Diagnosis not present

## 2016-07-15 DIAGNOSIS — Z6830 Body mass index (BMI) 30.0-30.9, adult: Secondary | ICD-10-CM | POA: Diagnosis not present

## 2016-07-15 DIAGNOSIS — M25562 Pain in left knee: Secondary | ICD-10-CM | POA: Diagnosis not present

## 2016-07-15 DIAGNOSIS — E1151 Type 2 diabetes mellitus with diabetic peripheral angiopathy without gangrene: Secondary | ICD-10-CM | POA: Diagnosis not present

## 2016-07-26 DIAGNOSIS — M1712 Unilateral primary osteoarthritis, left knee: Secondary | ICD-10-CM | POA: Diagnosis not present

## 2016-07-26 DIAGNOSIS — M542 Cervicalgia: Secondary | ICD-10-CM | POA: Diagnosis not present

## 2016-08-01 ENCOUNTER — Encounter: Payer: Self-pay | Admitting: Vascular Surgery

## 2016-08-02 DIAGNOSIS — M1712 Unilateral primary osteoarthritis, left knee: Secondary | ICD-10-CM | POA: Diagnosis not present

## 2016-08-03 ENCOUNTER — Encounter: Payer: Self-pay | Admitting: Vascular Surgery

## 2016-08-03 ENCOUNTER — Ambulatory Visit (INDEPENDENT_AMBULATORY_CARE_PROVIDER_SITE_OTHER): Payer: Medicare Other | Admitting: Vascular Surgery

## 2016-08-03 VITALS — BP 134/85 | HR 62 | Temp 97.3°F | Resp 96 | Ht 65.0 in | Wt 164.9 lb

## 2016-08-03 DIAGNOSIS — I872 Venous insufficiency (chronic) (peripheral): Secondary | ICD-10-CM | POA: Diagnosis not present

## 2016-08-03 NOTE — Progress Notes (Signed)
Vascular and Vein Specialist of [facility]  Patient name: Antonio Buchanan MRN: [id number] DOB: April 02, 1921 Sex: male  REASON FOR VISIT: Follow-up left leg chronic in his hypertension.  HPI: Antonio Buchanan is a 83 y.o. male here today for follow-up. He needs a yearly physician appointment due to disability related to venous hypertension in his left leg. This is the [employer]. Fortunately has had no new recurrent bouts of cellulitis. On my visit with him one year ago he had been admitted for cellulitis. He is very attentive to care of his leg. He does elevate his leg daily. He wears his support hose intermittently. He does have multiple other problems continuing to the issues. He does have polymyalgia rheumatica and is having some increase of this. Also has degenerative knee on the left with his had some injections with no improvement to date.  Past Medical History:  Diagnosis Date  . Aortic stenosis   . Benign neoplasm of colon   . Cancer ([place])    left cheek  . Diabetes mellitus   . Diverticulosis of colon (without mention of hemorrhage) 02/03/2005  . Hemorrhoids 02/03/2005   Internal and external  . History of Lyme disease   . HTN (hypertension)   . Hyperlipemia   . Personal history of colonic polyps   . Phlebitis 06-24-2014   left leg  . Polymyalgia rheumatica ([place])   . PTSD (post-traumatic stress disorder)   . Spinal stenosis   . Thalassemia   . Varicose veins     Family History  Problem Relation Age of Onset  . Diabetes Father   . Leukemia Mother   . Hypertension Mother   . Arthritis Mother   . Diabetes Sister     SOCIAL HISTORY: Social History  Substance Use Topics  . Smoking status: Former Smoker    Years: 78.00    Types: Cigars  . Smokeless tobacco: Never Used     Comment: pt states that he does not inhale  . Alcohol use No    Allergies  Allergen Reactions  . Cephalosporins Hives and  Itching  . Lamisil Af Defense [Tolnaftate]     Severe hives    Current Outpatient Prescriptions  Medication Sig Dispense Refill  . amLODipine (NORVASC) 5 MG tablet Take 5 mg by mouth as needed (blood pressure).     Marland Kitchen aspirin 81 MG tablet Take 81 mg by mouth daily.    Marland Kitchen buPROPion (WELLBUTRIN) 100 MG tablet Take 50 mg by mouth every evening.     . docusate sodium (COLACE) 100 MG capsule Take 100 mg by mouth daily.     Marland Kitchen escitalopram (LEXAPRO) 10 MG tablet Take 5 mg by mouth daily.     . fish oil-omega-3 fatty acids 1000 MG capsule Take 5 g by mouth every other day.     Marland Kitchen glipiZIDE (GLUCOTROL) 5 MG tablet Take 5 mg by mouth daily before breakfast.    . Melatonin 10 MG TABS Take 1 capsule by mouth daily.    . metFORMIN (GLUCOPHAGE) 1000 MG tablet Take 2,000 mg by mouth 2 (two) times daily with a meal.     . metoprolol succinate (TOPROL XL) 25 MG 24 hr tablet Take 1 tablet (25 mg total) by mouth daily. or as directed (Patient taking differently: Take 12.5 mg by mouth daily. or as directed) 90 tablet 3  . multivitamin-lutein (OCUVITE-LUTEIN) CAPS capsule Take 1 capsule by mouth daily.    . polyethylene glycol (MIRALAX / GLYCOLAX) packet Take 17  g by mouth daily as needed.     . predniSONE (DELTASONE) 1 MG tablet Take 1 tablet (1 mg total) by mouth daily with breakfast. Take 3 mg daily with prednisone 5 mg for a total dose of 8 mg daily for one month, then take 7 mg daily for one month then 6 mg daily for 1 month. 180 tablet 0  . predniSONE (DELTASONE) 5 MG tablet Take 1 tablet (5 mg total) by mouth daily with breakfast. 90 tablet 0  . ammonium lactate (LAC-HYDRIN) 12 % cream Apply topically as needed for dry skin. As directed (Patient not taking: Reported on 06/16/2016) 385 g 0  . benazepril (LOTENSIN) 40 MG tablet Take 1 tablet (40 mg total) by mouth daily. (Patient not taking: Reported on 06/16/2016) 90 tablet 3  . GRAPE SEED EXTRACT PO Take 1 tablet by mouth daily.    . Linaclotide (LINZESS) 290  MCG CAPS capsule Take 1 capsule (290 mcg total) by mouth daily. (Patient not taking: Reported on 06/16/2016) 30 capsule 5  . lisinopril (PRINIVIL,ZESTRIL) 2.5 MG tablet Take 1 tablet (2.5 mg total) by mouth daily. Please schedule appointment for refills. (Patient not taking: Reported on 06/16/2016) 90 tablet 0   No current facility-administered medications for this visit.     REVIEW OF SYSTEMS:  [X]  denotes positive finding, [ ]  denotes negative finding Cardiac  Comments:  Chest pain or chest pressure:    Shortness of breath upon exertion:    Short of breath when lying flat:    Irregular heart rhythm:        Vascular    Pain in calf, thigh, or hip brought on by ambulation: x   Pain in feet at night that wakes you up from your sleep:  x   Blood clot in your veins: x   Leg swelling:  x         PHYSICAL EXAM: Vitals:   08/03/16 1617 08/03/16 1619  BP: (!) 141/79 134/85  Pulse: 62   Resp: (!) 96   Temp: 97.3 F (36.3 C)   TempSrc: Oral   SpO2: 96%   Weight: 164 lb 14.4 oz (74.8 kg)   Height: 5\' 5"  (1.651 m)     GENERAL: The patient is a well-nourished male, in no acute distress. The vital signs are documented above. CARDIOVASCULAR: 2+ left dorsalis pedis pulse PULMONARY: There is good air exchange  MUSCULOSKELETAL: There are no major deformities or cyanosis. NEUROLOGIC: No focal weakness or paresthesias are detected. SKIN: Extensive changes of left leg venous hypertension with circumferential hemosiderin from his ankle to his knee. Marked varicosities. Fortunately no open ulcers PSYCHIATRIC: The patient has a normal affect.  DATA:  No new noninvasive studies  MEDICAL ISSUES: Patient continues do well as bad age of 9. He continues to have disability related to his severe venous stasis disease and chronic venous insufficiency in his left leg. He continues to have disability due to this and is unable to stand for prolonged periods of time. This will be a lifelong condition. He  will continue his elevation and compression as possible.    Rosetta Posner, MD FACS [facility] Tel [phone] Pager [phone]

## 2016-08-10 DIAGNOSIS — M1712 Unilateral primary osteoarthritis, left knee: Secondary | ICD-10-CM | POA: Diagnosis not present

## 2016-08-23 ENCOUNTER — Ambulatory Visit (INDEPENDENT_AMBULATORY_CARE_PROVIDER_SITE_OTHER): Payer: Medicare Other | Admitting: Podiatry

## 2016-08-23 ENCOUNTER — Encounter: Payer: Self-pay | Admitting: Podiatry

## 2016-08-23 DIAGNOSIS — B351 Tinea unguium: Secondary | ICD-10-CM | POA: Diagnosis not present

## 2016-08-23 DIAGNOSIS — M79676 Pain in unspecified toe(s): Secondary | ICD-10-CM

## 2016-08-23 DIAGNOSIS — M2010 Hallux valgus (acquired), unspecified foot: Secondary | ICD-10-CM

## 2016-08-23 DIAGNOSIS — E1140 Type 2 diabetes mellitus with diabetic neuropathy, unspecified: Secondary | ICD-10-CM

## 2016-08-23 NOTE — Progress Notes (Signed)
Patient ID: Antonio Buchanan, male   DOB: 03/02/1922, 83 y.o.   MRN: [id number] Complaint:  Visit Type: Patient returns to my office for continued preventative foot care services. Complaint: Patient states" my nails have grown long and thick and become painful to walk and wear shoes" Patient has been diagnosed with DM with neuropathy . The patient presents for preventative foot care services. No changes to ROS  Podiatric Exam: Vascular: dorsalis pedis and posterior tibial pulses are palpable bilateral. Capillary return is immediate. Temperature gradient is WNL. Skin turgor WNL  The left leg has redness swelling and red skin lesions left leg. Venous stasis left leg.  Sensorium: Diminished Semmes Weinstein monofilament test. Normal tactile sensation bilaterally. Nail Exam: Pt has thick disfigured discolored nails with subungual debris noted bilateral entire nail hallux through fifth toenails Ulcer Exam: There is no evidence of ulcer or pre-ulcerative changes or infection. Orthopedic Exam: Muscle tone and strength are WNL. No limitations in general ROM. No crepitus or effusions noted. HAV  B/L. Skin: No Porokeratosis. No infection or ulcers.  Peeling and redness both heels.  Diagnosis:  Onychomycosis, , Pain in right toe, pain in left toes  Treatment & Plan Procedures and Treatment: Consent by patient was obtained for treatment procedures. The patient understood the discussion of treatment and procedures well. All questions were answered thoroughly reviewed. Debridement of mycotic and hypertrophic toenails, 1 through 5 bilateral and clearing of subungual debris. No ulceration, no infection noted. Check on diabetic shoes. Return Visit-Office Procedure: Patient instructed to return to the office for a follow up visit 3 months for continued evaluation and treatment.   Gardiner Barefoot DPM

## 2016-10-13 DIAGNOSIS — Z6829 Body mass index (BMI) 29.0-29.9, adult: Secondary | ICD-10-CM | POA: Diagnosis not present

## 2016-10-13 DIAGNOSIS — M25562 Pain in left knee: Secondary | ICD-10-CM | POA: Diagnosis not present

## 2016-10-13 DIAGNOSIS — R2681 Unsteadiness on feet: Secondary | ICD-10-CM | POA: Diagnosis not present

## 2016-10-13 DIAGNOSIS — M48061 Spinal stenosis, lumbar region without neurogenic claudication: Secondary | ICD-10-CM | POA: Diagnosis not present

## 2016-10-13 DIAGNOSIS — E1151 Type 2 diabetes mellitus with diabetic peripheral angiopathy without gangrene: Secondary | ICD-10-CM | POA: Diagnosis not present

## 2016-10-13 DIAGNOSIS — I10 Essential (primary) hypertension: Secondary | ICD-10-CM | POA: Diagnosis not present

## 2016-12-12 DIAGNOSIS — Z6829 Body mass index (BMI) 29.0-29.9, adult: Secondary | ICD-10-CM | POA: Diagnosis not present

## 2016-12-12 DIAGNOSIS — W5503XA Scratched by cat, initial encounter: Secondary | ICD-10-CM | POA: Diagnosis not present

## 2016-12-12 DIAGNOSIS — S60921A Unspecified superficial injury of right hand, initial encounter: Secondary | ICD-10-CM | POA: Diagnosis not present

## 2016-12-26 ENCOUNTER — Other Ambulatory Visit: Payer: Self-pay | Admitting: Cardiology

## 2016-12-27 ENCOUNTER — Ambulatory Visit (INDEPENDENT_AMBULATORY_CARE_PROVIDER_SITE_OTHER): Payer: Medicare Other | Admitting: Podiatry

## 2016-12-27 ENCOUNTER — Encounter: Payer: Self-pay | Admitting: Podiatry

## 2016-12-27 DIAGNOSIS — B351 Tinea unguium: Secondary | ICD-10-CM | POA: Diagnosis not present

## 2016-12-27 DIAGNOSIS — E1140 Type 2 diabetes mellitus with diabetic neuropathy, unspecified: Secondary | ICD-10-CM

## 2016-12-27 DIAGNOSIS — M79676 Pain in unspecified toe(s): Secondary | ICD-10-CM

## 2016-12-27 DIAGNOSIS — L309 Dermatitis, unspecified: Secondary | ICD-10-CM

## 2016-12-28 ENCOUNTER — Other Ambulatory Visit: Payer: Self-pay

## 2016-12-28 ENCOUNTER — Telehealth: Payer: Self-pay | Admitting: Podiatry

## 2016-12-28 MED ORDER — FLUOCINONIDE 0.05 % EX OINT: 1.0000 "application " | TOPICAL_OINTMENT | Freq: Two times a day (BID) | CUTANEOUS | 1 refills | Status: DC

## 2016-12-28 NOTE — Telephone Encounter (Signed)
My father in law was seen yesterday and the doctor prescribed metoprolol succinate. I called the pharmacy which is [facility] on [address] and they did not get it. Can you please get the Rx sent there. If you need to speak to me, you can reach me at [phone]. Thank you.

## 2016-12-28 NOTE — Telephone Encounter (Signed)
Prescription for Lidex sent over to pharmacy, metoprolol is not a medication that is prescribed by any of our doctors

## 2016-12-28 NOTE — Progress Notes (Signed)
Patient ID: Antonio Buchanan, male   DOB: 06-26-1921, 83 y.o.   MRN: [id number] Complaint:  Visit Type: Patient returns to my office for continued preventative foot care services. Complaint: Patient states" my nails have grown long and thick and become painful to walk and wear shoes" Patient has been diagnosed with DM with neuropathy . The patient presents for preventative foot care services. No changes to ROS.  Patient has multiple red lesions noted on right foot and leg.  Podiatric Exam: Vascular: dorsalis pedis and posterior tibial pulses are palpable bilateral. Capillary return is immediate. Temperature gradient is WNL. Skin turgor WNL  The left leg has redness swelling and red skin lesions left leg. Venous stasis left leg.  Sensorium: Diminished Semmes Weinstein monofilament test. Normal tactile sensation bilaterally. Nail Exam: Pt has thick disfigured discolored nails with subungual debris noted bilateral entire nail hallux through fifth toenails Ulcer Exam: There is no evidence of ulcer or pre-ulcerative changes or infection. Orthopedic Exam: Muscle tone and strength are WNL. No limitations in general ROM. No crepitus or effusions noted. HAV  B/L. Skin: No Porokeratosis. No infection or ulcers.  Red lesions noted foot and leg right.    Diagnosis:  Onychomycosis, , Pain in right toe, pain in left toes  Treatment & Plan Procedures and Treatment: Consent by patient was obtained for treatment procedures. The patient understood the discussion of treatment and procedures well. All questions were answered thoroughly reviewed. Debridement of mycotic and hypertrophic toenails, 1 through 5 bilateral and clearing of subungual debris. No ulceration, no infection noted. Prescribe lidex. Return Visit-Office Procedure: Patient instructed to return to the office for a follow up visit 3 months for continued evaluation and treatment.   Gardiner Barefoot DPM

## 2017-02-13 DIAGNOSIS — Z6828 Body mass index (BMI) 28.0-28.9, adult: Secondary | ICD-10-CM | POA: Diagnosis not present

## 2017-02-13 DIAGNOSIS — E1151 Type 2 diabetes mellitus with diabetic peripheral angiopathy without gangrene: Secondary | ICD-10-CM | POA: Diagnosis not present

## 2017-02-13 DIAGNOSIS — M353 Polymyalgia rheumatica: Secondary | ICD-10-CM | POA: Diagnosis not present

## 2017-02-13 DIAGNOSIS — I10 Essential (primary) hypertension: Secondary | ICD-10-CM | POA: Diagnosis not present

## 2017-02-13 DIAGNOSIS — I350 Nonrheumatic aortic (valve) stenosis: Secondary | ICD-10-CM | POA: Diagnosis not present

## 2017-02-13 DIAGNOSIS — R2681 Unsteadiness on feet: Secondary | ICD-10-CM | POA: Diagnosis not present

## 2017-02-13 DIAGNOSIS — K30 Functional dyspepsia: Secondary | ICD-10-CM | POA: Diagnosis not present

## 2017-02-13 DIAGNOSIS — I872 Venous insufficiency (chronic) (peripheral): Secondary | ICD-10-CM | POA: Diagnosis not present

## 2017-03-29 ENCOUNTER — Ambulatory Visit (INDEPENDENT_AMBULATORY_CARE_PROVIDER_SITE_OTHER): Payer: Medicare Other | Admitting: Podiatry

## 2017-03-29 ENCOUNTER — Encounter: Payer: Self-pay | Admitting: Podiatry

## 2017-03-29 DIAGNOSIS — B351 Tinea unguium: Secondary | ICD-10-CM

## 2017-03-29 DIAGNOSIS — M79676 Pain in unspecified toe(s): Secondary | ICD-10-CM

## 2017-03-29 DIAGNOSIS — M2010 Hallux valgus (acquired), unspecified foot: Secondary | ICD-10-CM

## 2017-03-29 DIAGNOSIS — E1140 Type 2 diabetes mellitus with diabetic neuropathy, unspecified: Secondary | ICD-10-CM

## 2017-03-29 DIAGNOSIS — M129 Arthropathy, unspecified: Secondary | ICD-10-CM

## 2017-03-29 NOTE — Progress Notes (Signed)
Patient ID: Antonio Buchanan, male   DOB: 1922-03-17, 84 y.o.   MRN: [id number] Complaint:  Visit Type: Patient returns to my office for continued preventative foot care services. Complaint: Patient states" my nails have grown long and thick and become painful to walk and wear shoes" Patient has been diagnosed with DM with neuropathy . The patient presents for preventative foot care services. No changes to ROS.  Patient has multiple red lesions noted on right foot and leg.  Podiatric Exam: Vascular: dorsalis pedis and posterior tibial pulses are palpable bilateral. Capillary return is immediate. Temperature gradient is WNL. Skin turgor WNL  The left leg has redness swelling and red skin lesions left leg. Venous stasis left leg.  Sensorium: Diminished Semmes Weinstein monofilament test. Normal tactile sensation bilaterally. Nail Exam: Pt has thick disfigured discolored nails with subungual debris noted bilateral entire nail hallux through fifth toenails Ulcer Exam: There is no evidence of ulcer or pre-ulcerative changes or infection. Orthopedic Exam: Muscle tone and strength are WNL. No limitations in general ROM. No crepitus or effusions noted. HAV  B/L. Skin: No Porokeratosis. No infection or ulcers.  Asymptomatic skin lesions resembling alligator skin.   Diagnosis:  Onychomycosis, , Pain in right toe, pain in left toes  Treatment & Plan Procedures and Treatment: Consent by patient was obtained for treatment procedures. The patient understood the discussion of treatment and procedures well. All questions were answered thoroughly reviewed. Debridement of mycotic and hypertrophic toenails, 1 through 5 bilateral and clearing of subungual debris. No ulceration, no infection noted. ABN signed for 2019. Return Visit-Office Procedure: Patient instructed to return to the office for a follow up visit 3 months for continued evaluation and treatment.   Gardiner Barefoot DPM

## 2017-03-31 NOTE — Progress Notes (Signed)
Office Visit Note  Patient: Antonio Buchanan             Date of Birth: 09/12/1921           MRN: [id number]             PCP: Leanna Battles, MD Referring: Leanna Battles, MD Visit Date: 04/03/2017 Occupation: _0 @    Subjective:  Left knee pain  History of Present Illness: Jasten Guyette is a 84 y.o. male with history of PMR, DDD of c-spine, and osteoarthritis.  Patient states he is on Prednisone 5 mg daily.  He has noticed increased muscle weakness in his upper extremities.  He also has muscle tenderness in his upper extremities.  He denies any shoulder pain.  He also reports left knee pain.  He states he sees Dr. Mayer Camel who has performed x-rays of his knee and cortisone injections. He denies any knee swelling.  He states the pain is worse when getting out a chair. He is trying to put off a knee replacement as long as he can.  He has had several falls over the past year.  He denies walking with a cane or walker.  He denies any hip pain.  He does have lower extremity weakness as well.  He tries to perform exercises regularly and walks on a regular basis.   He denies any neck pain at this time.       Activities of Daily Living:  Patient reports morning stiffness for 5 minutes.   Patient Denies nocturnal pain.  Difficulty dressing/grooming: Denies Difficulty climbing stairs: Reports Difficulty getting out of chair: Reports Difficulty using hands for taps, buttons, cutlery, and/or writing: Denies   Review of Systems  Constitutional: Negative for fatigue, night sweats and weakness.  HENT: Negative for mouth sores, mouth dryness and nose dryness.   Eyes: Negative for redness and dryness.  Respiratory: Negative for cough, hemoptysis, shortness of breath and difficulty breathing.   Cardiovascular: Negative for chest pain, palpitations, hypertension, irregular heartbeat and swelling in legs/feet.  Gastrointestinal: Negative for blood in stool, constipation and  diarrhea.  Endocrine: Negative for increased urination.  Genitourinary: Negative for painful urination.  Musculoskeletal: Negative for arthralgias, joint pain, joint swelling, myalgias, muscle weakness, morning stiffness, muscle tenderness and myalgias.  Skin: Negative for color change, rash, hair loss, nodules/bumps, skin tightness, ulcers and sensitivity to sunlight.  Allergic/Immunologic: Negative for susceptible to infections.  Neurological: Negative for dizziness, fainting, memory loss and night sweats.  Hematological: Negative for swollen glands.  Psychiatric/Behavioral: Negative for depressed mood and sleep disturbance. The patient is not nervous/anxious.     PMFS History:  Patient Active Problem List   Diagnosis Date Noted  . Polymyalgia rheumatica ([place]) 06/09/2016  . Spondylosis of lumbar region without myelopathy or radiculopathy 06/09/2016  . DJD (degenerative joint disease), cervical 06/09/2016  . Primary osteoarthritis of both hands 06/09/2016  . History of scoliosis 06/09/2016  . Venous insufficiency 06/09/2016  . History of thalassemia 06/09/2016  . History of trigeminal neuralgia 06/09/2016  . History of diabetes mellitus 06/09/2016  . Rash 09/08/2014  . ARF (acute renal failure) ([place]) 09/08/2014  . Venous stasis dermatitis of left lower extremity 09/08/2014  . Dehydration 09/08/2014  . Allergic reaction caused by a drug 09/08/2014  . Congestive dilated cardiomyopathy ([place]) 08/29/2014  . Chronic constipation 03/26/2012  . Aortic stenosis 03/01/2012  . Chronic venous hypertension without complications 67/89/3810  . Murmur 02/24/2011  . Tobacco abuse 02/24/2011  . Diabetes mellitus ([place])   .  HTN (hypertension)   . Hyperlipemia   . Spinal stenosis   . Thalassemia   . History of Lyme disease   . PTSD (post-traumatic stress disorder)   . GERD 04/06/2009  . NAUSEA 04/06/2009  . FLATULENCE-GAS-BLOATING 04/06/2009  . ABDOMINAL PAIN-EPIGASTRIC 04/06/2009    Past  Medical History:  Diagnosis Date  . Aortic stenosis   . Benign neoplasm of colon   . Cancer ([place])    left cheek  . Diabetes mellitus   . Diverticulosis of colon (without mention of hemorrhage) 02/03/2005  . Hemorrhoids 02/03/2005   Internal and external  . History of Lyme disease   . HTN (hypertension)   . Hyperlipemia   . Personal history of colonic polyps   . Phlebitis 06-24-2014   left leg  . Polymyalgia rheumatica ([place])   . PTSD (post-traumatic stress disorder)   . Spinal stenosis   . Thalassemia   . Varicose veins     Family History  Problem Relation Age of Onset  . Diabetes Father   . Leukemia Mother   . Hypertension Mother   . Arthritis Mother   . Diabetes Sister    Past Surgical History:  Procedure Laterality Date  . CATARACT EXTRACTION, BILATERAL    . HEMORROIDECTOMY    . KIDNEY STONE SURGERY     Social History   Social History Narrative  . Not on file     Objective: Vital Signs: BP (!) 144/78 (BP Location: Left Arm, Patient Position: Sitting, Cuff Size: Normal)   Pulse 69   Resp 15   Ht _0  (1.676 m)   Wt 164 lb (74.4 kg)   BMI 26.47 kg/m    Physical Exam  Constitutional: He is oriented to person, place, and time. He appears well-developed and well-nourished.  HENT:  Head: Normocephalic and atraumatic.  Eyes: Conjunctivae and EOM are normal. Pupils are equal, round, and reactive to light.  Neck: Normal range of motion. Neck supple.  Cardiovascular: Normal rate, regular rhythm and normal heart sounds.  Pulmonary/Chest: Effort normal and breath sounds normal.  Abdominal: Soft. Bowel sounds are normal.  Neurological: He is alert and oriented to person, place, and time.  Skin: Skin is warm and dry. Capillary refill takes less than 2 seconds.  Psychiatric: He has a normal mood and affect. His behavior is normal.  Nursing note and vitals reviewed.    Musculoskeletal Exam: C-spine, thoracic, and lumbar spine good ROM.  Right shoulder limited  abduction.  Right elbow mild contracture.  Left shoulder and elbow good ROM.  Wrist joints, MCPs, PIPs, and DIPs good ROM with no synovitis.  Complete fist formation.  PIP and DIP synovial thickening consistent with osteoarthritis.  Hip joints limited ROM.  Knee joints full ROM with no effusion or warmth.  Ankle joints, MTPs, PIPs, and DIPs good ROM with no synovitis.  No midline spinal tenderness.  No SI joint tenderness.  No trochanteric bursitis.    CDAI Exam: No CDAI exam completed.    Investigation: No additional findings.   Imaging: No results found.  Speciality Comments: No specialty comments available.    Procedures:  No procedures performed Allergies: Cephalosporins and Lamisil af defense [tolnaftate]   Assessment / Plan:     Visit Diagnoses: Polymyalgia rheumatica (HCC) - Prednisone 5 mg.  He is having upper and lower extremity muscle weakness and deconditioning.  A ESR and BMP were drawn today.  If his ESR is elevated, we will increase his Prednisone.  A referral to physical  therapy at [facility] was made to work on muscle deconditioning.   Unilateral primary osteoarthritis, left knee: He sees Dr. Mayer Camel for management of his osteoarthritis of his left knee.  He has tried cortisone injections in the past.  He is trying to delay a total knee arthroplasty as long as possible.  He was given a handout of knee exercises.  He will be going to physical therapy for lower extremity weakness and balance issues.    Spondylosis of lumbar region without myelopathy or radiculopathy: Doing well.  No discomfort at this time.    DDD (degenerative disc disease), cervical: Good ROM on exam.  No discomfort at this time.    Primary osteoarthritis of both hands: PIP and DIP synovial thickening consistent with osteoarthritis.  He denies any pain or swelling.  He has no synovitis.    Other medical conditions are listed as follows:   History of thalassemia  History of  scoliosis  History of diabetes mellitus  History of trigeminal neuralgia  History of gastroesophageal reflux (GERD)  History of hyperlipidemia  Venous insufficiency  History of Lyme disease - treated with IV ABX     Orders: Orders Placed This Encounter  Procedures  . Sedimentation rate  . BASIC METABOLIC PANEL WITH GFR  . Ambulatory referral to Physical Therapy   No orders of the defined types were placed in this encounter.    Follow-Up Instructions: Return in about 5 months (around 09/01/2017) for Polymyalgia Rheumatica.   Bo Merino, MD  Note - This record has been created using Editor, commissioning.  Chart creation errors have been sought, but may not always  have been located. Such creation errors do not reflect on  the standard of medical care.

## 2017-04-03 ENCOUNTER — Ambulatory Visit (INDEPENDENT_AMBULATORY_CARE_PROVIDER_SITE_OTHER): Payer: Medicare Other | Admitting: Rheumatology

## 2017-04-03 ENCOUNTER — Encounter: Payer: Self-pay | Admitting: Rheumatology

## 2017-04-03 VITALS — BP 144/78 | HR 69 | Resp 15 | Ht 66.0 in | Wt 164.0 lb

## 2017-04-03 DIAGNOSIS — M1712 Unilateral primary osteoarthritis, left knee: Secondary | ICD-10-CM | POA: Diagnosis not present

## 2017-04-03 DIAGNOSIS — M47816 Spondylosis without myelopathy or radiculopathy, lumbar region: Secondary | ICD-10-CM

## 2017-04-03 DIAGNOSIS — Z8639 Personal history of other endocrine, nutritional and metabolic disease: Secondary | ICD-10-CM | POA: Diagnosis not present

## 2017-04-03 DIAGNOSIS — Z862 Personal history of diseases of the blood and blood-forming organs and certain disorders involving the immune mechanism: Secondary | ICD-10-CM

## 2017-04-03 DIAGNOSIS — M19042 Primary osteoarthritis, left hand: Secondary | ICD-10-CM

## 2017-04-03 DIAGNOSIS — Z8619 Personal history of other infectious and parasitic diseases: Secondary | ICD-10-CM

## 2017-04-03 DIAGNOSIS — M19041 Primary osteoarthritis, right hand: Secondary | ICD-10-CM

## 2017-04-03 DIAGNOSIS — Z8739 Personal history of other diseases of the musculoskeletal system and connective tissue: Secondary | ICD-10-CM

## 2017-04-03 DIAGNOSIS — M5030 Other cervical disc degeneration, unspecified cervical region: Secondary | ICD-10-CM

## 2017-04-03 DIAGNOSIS — R29898 Other symptoms and signs involving the musculoskeletal system: Secondary | ICD-10-CM

## 2017-04-03 DIAGNOSIS — I872 Venous insufficiency (chronic) (peripheral): Secondary | ICD-10-CM | POA: Diagnosis not present

## 2017-04-03 DIAGNOSIS — Z8719 Personal history of other diseases of the digestive system: Secondary | ICD-10-CM

## 2017-04-03 DIAGNOSIS — M353 Polymyalgia rheumatica: Secondary | ICD-10-CM

## 2017-04-03 DIAGNOSIS — Z8669 Personal history of other diseases of the nervous system and sense organs: Secondary | ICD-10-CM | POA: Diagnosis not present

## 2017-04-03 NOTE — Patient Instructions (Signed)

## 2017-04-04 LAB — BASIC METABOLIC PANEL WITH GFR
BUN / CREAT RATIO: 21 (calc) (ref 6–22)
BUN: 28 mg/dL — ABNORMAL HIGH (ref 7–25)
CO2: 28 mmol/L (ref 20–32)
Calcium: 8.6 mg/dL (ref 8.6–10.3)
Chloride: 104 mmol/L (ref 98–110)
Creat: 1.35 mg/dL — ABNORMAL HIGH (ref 0.70–1.11)
GFR, EST NON AFRICAN AMERICAN: 45 mL/min/{1.73_m2} — AB (ref >=60)
GFR, Est African American: 52 mL/min/{1.73_m2} — ABNORMAL LOW (ref >=60)
Glucose, Bld: 142 mg/dL — ABNORMAL HIGH (ref 65–99)
Potassium: 5.6 mmol/L — ABNORMAL HIGH (ref 3.5–5.3)
Sodium: 140 mmol/L (ref 135–146)

## 2017-04-04 LAB — SEDIMENTATION RATE: Sed Rate: 22 mm/h — ABNORMAL HIGH (ref 0–20)

## 2017-04-04 NOTE — Progress Notes (Signed)
Sed rate is mildly elevated.  We will continue to monitor.  He can continue at this current dose of Prednisone.   Potassium is elevated.  Please notify patient and fax results to PCP.  All other labs are stable.

## 2017-04-05 NOTE — Progress Notes (Signed)
HPI: FU AS. Myoview in 2008 showed EF 59 and normal perfusion. There has been a question of severity of aortic stenosis in the past. Dobutamine echocardiogram in February 2015 was felt consistent with severe aortic stenosis; low normal LV function. Last echocardiogram November 2017 showed ejection fraction 40-45%, moderate to severe aortic stenosis with mean gradient 26 mmHg, aortic valve area by continuity equation 0.8 cm, moderate left atrial enlargement. Since last seen,  patient denies dyspnea, chest pain, palpitations or syncope.  Current Outpatient Medications  Medication Sig Dispense Refill  . amLODipine (NORVASC) 5 MG tablet Take 5 mg by mouth as needed (blood pressure).     Marland Kitchen ammonium lactate (LAC-HYDRIN) 12 % cream Apply topically as needed for dry skin. As directed 385 g 0  . aspirin 81 MG tablet Take 81 mg by mouth daily.    . benazepril (LOTENSIN) 40 MG tablet TAKE 1 TABLET DAILY (Patient taking differently: TAKE 1/2  TABLET DAILY) 30 tablet 0  . buPROPion (WELLBUTRIN) 100 MG tablet Take 50 mg by mouth every evening.     . docusate sodium (COLACE) 100 MG capsule Take 100 mg by mouth as needed.     Marland Kitchen escitalopram (LEXAPRO) 10 MG tablet Take 10 mg by mouth daily.     . fish oil-omega-3 fatty acids 1000 MG capsule Take 5 g by mouth every other day.     Marland Kitchen glipiZIDE (GLUCOTROL) 5 MG tablet Take 5 mg by mouth as needed.     Marland Kitchen GRAPE SEED EXTRACT PO Take 1 tablet by mouth daily.    Marland Kitchen lisinopril (PRINIVIL,ZESTRIL) 2.5 MG tablet Take 1 tablet (2.5 mg total) by mouth daily. Please schedule appointment for refills. 90 tablet 0  . Melatonin 10 MG TABS Take 1 capsule by mouth daily.    . metFORMIN (GLUCOPHAGE) 1000 MG tablet Take 2,000 mg by mouth 2 (two) times daily with a meal.     . metoprolol succinate (TOPROL-XL) 25 MG 24 hr tablet TAKE 1 TABLET DAILY OR AS DIRECTED (Patient taking differently: TAKE 1/2 TABLET DAILY OR AS DIRECTED) 30 tablet 1  . multivitamin-lutein (OCUVITE-LUTEIN)  CAPS capsule Take 1 capsule by mouth daily.    . polyethylene glycol (MIRALAX / GLYCOLAX) packet Take 17 g by mouth daily as needed.     . predniSONE (DELTASONE) 5 MG tablet Take 1 tablet (5 mg total) by mouth daily with breakfast. 90 tablet 0   No current facility-administered medications for this visit.      Past Medical History:  Diagnosis Date  . Aortic stenosis   . Benign neoplasm of colon   . Cancer ([place])    left cheek  . Diabetes mellitus   . Diverticulosis of colon (without mention of hemorrhage) 02/03/2005  . Hemorrhoids 02/03/2005   Internal and external  . History of Lyme disease   . HTN (hypertension)   . Hyperlipemia   . Personal history of colonic polyps   . Phlebitis 06-24-2014   left leg  . Polymyalgia rheumatica ([place])   . PTSD (post-traumatic stress disorder)   . Spinal stenosis   . Thalassemia   . Varicose veins     Past Surgical History:  Procedure Laterality Date  . CATARACT EXTRACTION, BILATERAL    . HEMORROIDECTOMY    . KIDNEY STONE SURGERY      Social History   Socioeconomic History  . Marital status: Married    Spouse name: Not on file  . Number of children: 2  .  Years of education: Not on file  . Highest education level: Not on file  Social Needs  . Financial resource strain: Not on file  . Food insecurity - worry: Not on file  . Food insecurity - inability: Not on file  . Transportation needs - medical: Not on file  . Transportation needs - non-medical: Not on file  Occupational History  . Occupation: retired Nature conservation officer  Tobacco Use  . Smoking status: Former Smoker    Years: 78.00    Types: Cigars  . Smokeless tobacco: Never Used  Substance and Sexual Activity  . Alcohol use: No    Alcohol/week: 0.0 oz  . Drug use: No  . Sexual activity: Not on file  Other Topics Concern  . Not on file  Social History Narrative  . Not on file    Family History  Problem Relation Age of Onset  . Diabetes Father   . Leukemia Mother   .  Hypertension Mother   . Arthritis Mother   . Diabetes Sister     ROS: no fevers or chills, productive cough, hemoptysis, dysphasia, odynophagia, melena, hematochezia, dysuria, hematuria, rash, seizure activity, orthopnea, PND, pedal edema, claudication. Remaining systems are negative.  Physical Exam: Well-developed well-nourished in no acute distress.  Skin is warm and dry.  HEENT is normal.  Neck is supple.  Chest is clear to auscultation with normal expansion.  Cardiovascular exam is regular rate and rhythm.  3/6 systolic murmur left sternal border.  S2 is diminished. Abdominal exam nontender or distended. No masses palpated. Extremities show no edema. neuro grossly intact  ECG-sinus rhythm with first-degree AV block.  Nonspecific lateral T wave changes.  Personally reviewed  A/P  1 aortic stenosis-plan repeat echocardiogram.  Patient would like to avoid procedures if possible which I think is appropriate given his age.  He may be a candidate for TAVR in the future if he develops symptoms.  2 hypertension-blood pressure is borderline.  Continue amlodipine, Toprol and enalapril.  Unable to follow blood pressure and adjust regimen as needed.  3 cardiomyopathy-continue ACE inhibitor and beta-blocker.  Repeat echocardiogram.  Kirk Ruths, MD

## 2017-04-11 ENCOUNTER — Ambulatory Visit (INDEPENDENT_AMBULATORY_CARE_PROVIDER_SITE_OTHER): Payer: Medicare Other | Admitting: Cardiology

## 2017-04-11 ENCOUNTER — Encounter: Payer: Self-pay | Admitting: Cardiology

## 2017-04-11 VITALS — BP 142/84 | HR 66 | Ht 66.0 in | Wt 164.8 lb

## 2017-04-11 DIAGNOSIS — I10 Essential (primary) hypertension: Secondary | ICD-10-CM

## 2017-04-11 DIAGNOSIS — I420 Dilated cardiomyopathy: Secondary | ICD-10-CM

## 2017-04-11 DIAGNOSIS — I350 Nonrheumatic aortic (valve) stenosis: Secondary | ICD-10-CM

## 2017-04-11 NOTE — Patient Instructions (Addendum)
Medication Instructions: STOP the Lisinopril  If you need a refill on your cardiac medications before your next appointment, please call your pharmacy.    Procedures/Testing: Your physician has requested that you have an echocardiogram. Echocardiography is a painless test that uses sound waves to create images of your heart. It provides your doctor with information about the size and shape of your heart and how well your heart's chambers and valves are working. This procedure takes approximately one hour. There are no restrictions for this procedure. This will be done at [facility], [address]    Follow-Up: Your physician wants you to follow-up in: 6 months with Dr. Stanford Breed. You will receive a reminder letter in the mail two months in advance. If you don't receive a letter, please call our office at [phone] to schedule this follow-up appointment.   Thank you for choosing [facility] at [facility]!!

## 2017-04-12 DIAGNOSIS — E119 Type 2 diabetes mellitus without complications: Secondary | ICD-10-CM | POA: Diagnosis not present

## 2017-04-12 DIAGNOSIS — Z961 Presence of intraocular lens: Secondary | ICD-10-CM | POA: Diagnosis not present

## 2017-04-12 DIAGNOSIS — D3132 Benign neoplasm of left choroid: Secondary | ICD-10-CM | POA: Diagnosis not present

## 2017-04-12 DIAGNOSIS — H353131 Nonexudative age-related macular degeneration, bilateral, early dry stage: Secondary | ICD-10-CM | POA: Diagnosis not present

## 2017-04-21 ENCOUNTER — Other Ambulatory Visit (HOSPITAL_COMMUNITY): Payer: Medicare Other

## 2017-04-21 ENCOUNTER — Other Ambulatory Visit: Payer: Self-pay

## 2017-04-21 ENCOUNTER — Ambulatory Visit (HOSPITAL_COMMUNITY): Payer: Medicare Other | Attending: Cardiology

## 2017-04-21 DIAGNOSIS — I352 Nonrheumatic aortic (valve) stenosis with insufficiency: Secondary | ICD-10-CM | POA: Diagnosis not present

## 2017-04-21 DIAGNOSIS — I350 Nonrheumatic aortic (valve) stenosis: Secondary | ICD-10-CM | POA: Diagnosis not present

## 2017-04-21 DIAGNOSIS — E785 Hyperlipidemia, unspecified: Secondary | ICD-10-CM | POA: Insufficient documentation

## 2017-04-21 DIAGNOSIS — I119 Hypertensive heart disease without heart failure: Secondary | ICD-10-CM | POA: Diagnosis not present

## 2017-04-21 DIAGNOSIS — F4310 Post-traumatic stress disorder, unspecified: Secondary | ICD-10-CM | POA: Insufficient documentation

## 2017-04-21 DIAGNOSIS — E119 Type 2 diabetes mellitus without complications: Secondary | ICD-10-CM | POA: Diagnosis not present

## 2017-04-24 ENCOUNTER — Other Ambulatory Visit: Payer: Self-pay | Admitting: Internal Medicine

## 2017-04-28 DIAGNOSIS — F17210 Nicotine dependence, cigarettes, uncomplicated: Secondary | ICD-10-CM | POA: Diagnosis not present

## 2017-04-28 DIAGNOSIS — J189 Pneumonia, unspecified organism: Secondary | ICD-10-CM | POA: Diagnosis not present

## 2017-04-28 DIAGNOSIS — Z6827 Body mass index (BMI) 27.0-27.9, adult: Secondary | ICD-10-CM | POA: Diagnosis not present

## 2017-04-28 DIAGNOSIS — E1151 Type 2 diabetes mellitus with diabetic peripheral angiopathy without gangrene: Secondary | ICD-10-CM | POA: Diagnosis not present

## 2017-04-28 DIAGNOSIS — I10 Essential (primary) hypertension: Secondary | ICD-10-CM | POA: Diagnosis not present

## 2017-04-28 DIAGNOSIS — R05 Cough: Secondary | ICD-10-CM | POA: Diagnosis not present

## 2017-05-29 ENCOUNTER — Telehealth: Payer: Self-pay | Admitting: Cardiology

## 2017-05-29 NOTE — Telephone Encounter (Signed)
UNABLE TO LMVM-MAILBOX IS FULL

## 2017-05-29 NOTE — Telephone Encounter (Signed)
New Message   Pt c/o medication issue:  1. Name of Medication: n/a  2. How are you currently taking this medication (dosage and times per day)? n/a  3. Are you having a reaction (difficulty breathing--STAT)? no  4. What is your medication issue? Pt's daughter verbalized that the pt is not taking some of his medications and she would like to update a nurse on what he is taking. Please call

## 2017-05-30 NOTE — Telephone Encounter (Signed)
Attempted to return call to daughter. VM box full. Sent message via MyChart to notify patient/daughter that updated medication list can be sent in [place] as well

## 2017-05-31 ENCOUNTER — Encounter: Payer: Self-pay | Admitting: *Deleted

## 2017-05-31 NOTE — Telephone Encounter (Signed)
Encounter closed. Please see previous note.

## 2017-05-31 NOTE — Telephone Encounter (Signed)
Follow up    Patients daughter Minus Liberty is calling back to provide the name of medications that her father is taken.  The only medication that he is taken at this time is metoprolol succinate (TOPROL-XL) 25 MG 24 hr tablet. He is also takes glipiZIDE (GLUCOTROL) 5 MG tablet, metformin, 1/2 wellbutrin  escitalopram (LEXAPRO) 10 MG tablet, and aspirin. He was taking the atenolol but they stopped that. He is not taken the amlodpine at all.

## 2017-05-31 NOTE — Telephone Encounter (Signed)
Medication list updated based on message from daughter. Mychart message sent requesting that patient bring all medications to next visit for review.

## 2017-06-13 DIAGNOSIS — R5383 Other fatigue: Secondary | ICD-10-CM | POA: Diagnosis not present

## 2017-06-13 DIAGNOSIS — M7912 Myalgia of auxiliary muscles, head and neck: Secondary | ICD-10-CM | POA: Diagnosis not present

## 2017-06-13 DIAGNOSIS — Z6827 Body mass index (BMI) 27.0-27.9, adult: Secondary | ICD-10-CM | POA: Diagnosis not present

## 2017-06-13 DIAGNOSIS — E7849 Other hyperlipidemia: Secondary | ICD-10-CM | POA: Diagnosis not present

## 2017-06-13 DIAGNOSIS — R2681 Unsteadiness on feet: Secondary | ICD-10-CM | POA: Diagnosis not present

## 2017-06-13 DIAGNOSIS — F4310 Post-traumatic stress disorder, unspecified: Secondary | ICD-10-CM | POA: Diagnosis not present

## 2017-06-13 DIAGNOSIS — E1151 Type 2 diabetes mellitus with diabetic peripheral angiopathy without gangrene: Secondary | ICD-10-CM | POA: Diagnosis not present

## 2017-06-13 DIAGNOSIS — I739 Peripheral vascular disease, unspecified: Secondary | ICD-10-CM | POA: Diagnosis not present

## 2017-06-13 DIAGNOSIS — I10 Essential (primary) hypertension: Secondary | ICD-10-CM | POA: Diagnosis not present

## 2017-06-13 DIAGNOSIS — I350 Nonrheumatic aortic (valve) stenosis: Secondary | ICD-10-CM | POA: Diagnosis not present

## 2017-06-21 ENCOUNTER — Ambulatory Visit (INDEPENDENT_AMBULATORY_CARE_PROVIDER_SITE_OTHER): Payer: Medicare Other | Admitting: Physician Assistant

## 2017-06-21 ENCOUNTER — Encounter: Payer: Self-pay | Admitting: Physician Assistant

## 2017-06-21 VITALS — BP 138/90 | HR 72 | Ht 62.0 in | Wt 153.0 lb

## 2017-06-21 DIAGNOSIS — K219 Gastro-esophageal reflux disease without esophagitis: Secondary | ICD-10-CM

## 2017-06-21 DIAGNOSIS — R110 Nausea: Secondary | ICD-10-CM | POA: Diagnosis not present

## 2017-06-21 MED ORDER — PANTOPRAZOLE SODIUM 40 MG PO TBEC: DELAYED_RELEASE_TABLET | ORAL | 11 refills | Status: AC

## 2017-06-21 NOTE — Progress Notes (Signed)
Subjective:    Patient ID: Antonio Buchanan, male    DOB: [birth year], 84 y.o.   MRN: [id number]  HPI Antonio Buchanan is a very pleasant 84 year old white male, known to Dr. Henrene Pastor and myself.  He was last seen in our office in 2015 at that time with severe constipation.  Patient is known to have diverticulosis, history of adenomatous colon polyps, severe PTSD, polymyalgia rheumatica for which she is on chronic low-dose steroids, thalassemia, adult onset diabetes mellitus, GERD and a cardiomyopathy with EF of 40-45%.  He also has moderate aortic stenosis. Patient comes in today with his daughter who has been concerned about recent complaints of nausea.  Patient took a course of doxycycline in the fall after a cat bite.  He then developed a pneumonia in January and took a second course of doxycycline.  She says over the past 6 weeks or so he had been complaining of more frequent nausea, and is often not wanting to eat breakfast due to complaints of not feeling right or nausea.  He denies any abdominal pain.  She says he had been on a multitude of medications and was also self-medicating.  She has since had him evaluated by a geriatric psychiatrist and his medication regimen has changed.  She is also now laying out his pills for him etc.  She says overall he is doing much better He does have Ativan which she is taking a couple of times per day for for anxiety and this is helped quite a bit.  Over the past couple weeks he has had less complaints about nausea. When the patient is asked directly he says that he feels like all of his symptoms are directly related to anxiety.  His daughter does feel that sometimes he cycles through his anxiety and PTSD and spring is always a hard time for him.  This is when he had some of his worst experiences during World War II.  Today as with other visits he brings up seeing horrible things at concentration camps that he cannot get out of his mind.  He is not been having any  vomiting.  No significant weight loss.  He has recently not had any significant complaints of constipation and is not requiring any regular laxatives.  He denies any heartburn or indigestion.  When asked about his nausea he says he does not feel like he is going to vomit and is not having any pain but just feels queasy frequently. He is also struggling with watching his wife of 18 some years deteriorate due to dementia. He had been on Protonix for some time for GERD.  This was taken off of his medication list several months ago. He has just recently had a physical and had multiple labs done through Dr. Bevelyn Buckles his PCP.  I do not have the results of these as yet  Review of Systems; Pertinent positive and negative review of systems were noted in the above HPI section.  All other review of systems was otherwise negative.  Outpatient Encounter Medications as of 06/21/2017  Medication Sig  . aspirin 81 MG tablet Take 81 mg by mouth daily.  . Coenzyme Q10 (CO Q-10 PO) Take 1 tablet by mouth daily.  Marland Kitchen escitalopram (LEXAPRO) 20 MG tablet Take 20 mg by mouth daily.  Marland Kitchen glipiZIDE (GLUCOTROL) 5 MG tablet Take 5 mg by mouth as needed.   Marland Kitchen LORazepam (ATIVAN) 0.5 MG tablet Take 0.5 mg by mouth 2 (two) times daily.  . Melatonin 5  MG TABS Take 1 tablet by mouth daily.  . Menaquinone-7 (VITAMIN K2 PO) Take 300 mg by mouth daily.  . metFORMIN (GLUCOPHAGE) 500 MG tablet Take 1,000 mg by mouth 2 (two) times daily with a meal.  . metoprolol succinate (TOPROL-XL) 25 MG 24 hr tablet Take 25 mg by mouth 2 (two) times daily.  . multivitamin-lutein (OCUVITE-LUTEIN) CAPS capsule Take 2 capsules by mouth daily.   . prednisoLONE 5 MG TABS tablet Take 10 mg by mouth daily.  . pantoprazole (PROTONIX) 40 MG tablet Take 1 tablet by mouth every morning.  . [DISCONTINUED] buPROPion (WELLBUTRIN) 100 MG tablet Take 50 mg by mouth every evening.   . [DISCONTINUED] docusate sodium (COLACE) 100 MG capsule Take 100 mg by mouth  as needed.   . [DISCONTINUED] escitalopram (LEXAPRO) 10 MG tablet Take 10 mg by mouth daily.   . [DISCONTINUED] fish oil-omega-3 fatty acids 1000 MG capsule Take 5 g by mouth every other day.   . [DISCONTINUED] GRAPE SEED EXTRACT PO Take 1 tablet by mouth daily.  . [DISCONTINUED] LORazepam (ATIVAN) 1 MG tablet Take 1 mg by mouth 2 (two) times daily.  . [DISCONTINUED] Melatonin 10 MG TABS Take 1 capsule by mouth daily.  . [DISCONTINUED] metFORMIN (GLUCOPHAGE) 1000 MG tablet Take 2,000 mg by mouth 2 (two) times daily with a meal.   . [DISCONTINUED] metoprolol succinate (TOPROL-XL) 25 MG 24 hr tablet TAKE 1 TABLET DAILY OR AS DIRECTED (Patient taking differently: TAKE 1/2 TABLET DAILY OR AS DIRECTED)  . [DISCONTINUED] polyethylene glycol (MIRALAX / GLYCOLAX) packet Take 17 g by mouth daily as needed.    No facility-administered encounter medications on file as of 06/21/2017.    Allergies  Allergen Reactions  . Cephalosporins Hives and Itching  . Lamisil Af Defense [Tolnaftate]     Severe hives   Patient Active Problem List   Diagnosis Date Noted  . Polymyalgia rheumatica ([place]) 06/09/2016  . Spondylosis of lumbar region without myelopathy or radiculopathy 06/09/2016  . DJD (degenerative joint disease), cervical 06/09/2016  . Primary osteoarthritis of both hands 06/09/2016  . History of scoliosis 06/09/2016  . Venous insufficiency 06/09/2016  . History of thalassemia 06/09/2016  . History of trigeminal neuralgia 06/09/2016  . History of diabetes mellitus 06/09/2016  . Rash 09/08/2014  . ARF (acute renal failure) ([place]) 09/08/2014  . Venous stasis dermatitis of left lower extremity 09/08/2014  . Dehydration 09/08/2014  . Allergic reaction caused by a drug 09/08/2014  . Congestive dilated cardiomyopathy ([place]) 08/29/2014  . Chronic constipation 03/26/2012  . Aortic stenosis 03/01/2012  . Chronic venous hypertension without complications 26/71/2458  . Murmur 02/24/2011  . Tobacco abuse  02/24/2011  . Diabetes mellitus ([place])   . HTN (hypertension)   . Hyperlipemia   . Spinal stenosis   . Thalassemia   . History of Lyme disease   . PTSD (post-traumatic stress disorder)   . GERD 04/06/2009  . NAUSEA 04/06/2009  . FLATULENCE-GAS-BLOATING 04/06/2009  . ABDOMINAL PAIN-EPIGASTRIC 04/06/2009   Social History   Socioeconomic History  . Marital status: Married    Spouse name: Not on file  . Number of children: 2  . Years of education: Not on file  . Highest education level: Not on file  Occupational History  . Occupation: retired Wellsite geologist  . Financial resource strain: Not on file  . Food insecurity:    Worry: Not on file    Inability: Not on file  . Transportation needs:    Medical:  Not on file    Non-medical: Not on file  Tobacco Use  . Smoking status: Former Smoker    Years: 78.00    Types: Cigars  . Smokeless tobacco: Never Used  Substance and Sexual Activity  . Alcohol use: No    Alcohol/week: 0.0 oz  . Drug use: No  . Sexual activity: Not on file  Lifestyle  . Physical activity:    Days per week: Not on file    Minutes per session: Not on file  . Stress: Not on file  Relationships  . Social connections:    Talks on phone: Not on file    Gets together: Not on file    Attends religious service: Not on file    Active member of club or organization: Not on file    Attends meetings of clubs or organizations: Not on file    Relationship status: Not on file  . Intimate partner violence:    Fear of current or ex partner: Not on file    Emotionally abused: Not on file    Physically abused: Not on file    Forced sexual activity: Not on file  Other Topics Concern  . Not on file  Social History Narrative  . Not on file    Mr. Kihara family history includes Arthritis in his mother; Diabetes in his father and sister; Hypertension in his mother; Leukemia in his mother.      Objective:    Vitals:   06/21/17 1345  BP: 138/90    Pulse: 72    Physical Exam; well-developed elderly white male in no acute distress, somewhat hard of hearing very pleasant accompanied by his daughter.  Patient is wearing his World War II veterans hat.  Blood pressure 138/90 pulse 72, height 5 foot 2, weight 153, BMI 27.9.  HEENT nontraumatic normocephalic EOMI PERRLA sclera anicteric, Cardiovascular regular rate and rhythm with S1-S2 he has a squeaky systolic murmur.  Pulmonary clear bilaterally, Abdomen soft, nontender nondistended bowel sounds are active no audible bruit no palpable mass or hepatosplenomegaly, Rectal exam not done,, Neuro psych mood and affect appropriate for this patient.  He repeatedly brings up his devastating experiences during World War II which continue to haunt him       Assessment & Plan:   #90 62 year old white male with complaints of frequent nausea/queasiness.  This is in the setting of ongoing severe anxiety and PTSD which patient feels is responsible for most of his physical symptoms. He does have history of GERD, he has also been on low-dose steroids chronically, and may have chronic gastropathy, and/or GERD induced queasiness. #2 history of constipation-currently not problematic #3 diverticulosis #4.  Adult onset diabetes mellitus #5.  Polymyalgia rheumatica #6.  Thalassemia #7.  Cardiomyopathy with EF 40-45% #8.  Aortic stenosis  Plan; will restart Protonix 40 mg p.o. every morning.  He may benefit from staying on this long-term. We discussed potential further imaging with CT.  His daughter will call back in 3-4 weeks, if his symptoms have not improved with pantoprazole will pursue further workup at that time. Patient will follow up with Dr. Henrene Pastor or myself on an as-needed basis.  Amy Genia Harold PA-C 06/21/2017   Cc: Leanna Battles, MD

## 2017-06-21 NOTE — Patient Instructions (Signed)
We sent refills for Pantoprazole sodium ( Protonix) 40 mg to your pharmacy.  Call back in 3 weeks if no improvement, we m ay consider a CT scan.  If you are age 82 or older, your body mass index should be between 23-30. Your Body mass index is 27.98 kg/m. If this is out of the aforementioned range listed, please consider follow up with your Primary Care Provider.

## 2017-06-22 ENCOUNTER — Telehealth: Payer: Self-pay | Admitting: *Deleted

## 2017-06-22 NOTE — Telephone Encounter (Signed)
Routed office note from 06-21-2017, Nicoletta Ba PA to Dr. Leanna Battles, [facility].

## 2017-06-22 NOTE — Progress Notes (Signed)
Assessment and plans reviewed  

## 2017-06-28 ENCOUNTER — Ambulatory Visit: Payer: Medicare Other | Admitting: Podiatry

## 2017-07-06 ENCOUNTER — Telehealth: Payer: Self-pay | Admitting: Cardiology

## 2017-07-06 NOTE — Telephone Encounter (Signed)
New Message:     Pt's daughter is calling to ask some questions about some possible procedures to help rule out some things going on with her father. Pt is only taking metoprolol succinate (TOPROL-XL) 25 MG 24 hr tablet 1 in am and 1 in pm.

## 2017-07-06 NOTE — Telephone Encounter (Signed)
Returned call to patient's daughter Minus Liberty.She stated father is having sob at times.No swelling.Weight stable.B/P good.She wanted to know what would be causing sob.Stated sob seems better when he takes anxiety medication.Appointment scheduled with Jory Sims DNP 07/11/17 at 11:30 am.

## 2017-07-07 ENCOUNTER — Other Ambulatory Visit: Payer: Self-pay | Admitting: Rheumatology

## 2017-07-10 NOTE — Telephone Encounter (Signed)
Last visit: 04/03/17 Next visit: 09/14/17  Okay to refill per Dr. Estanislado Pandy

## 2017-07-10 NOTE — Progress Notes (Signed)
Cardiology Office Note   Date:  07/10/2017   ID:  Antonio Buchanan, DOB November 02, 1921, MRN [id number]  PCP:  Leanna Battles, MD  Cardiologist: Dr. Stanford Breed No chief complaint on file.    History of Present Illness: Antonio Buchanan is a 84 y.o. male who presents for ongoing assessment and management of aortic valve stenosis, with most recent echocardiogram November 2017 revealing low normal LV function with an EF of 40% to 45% with moderate to severe aortic valve stenosis, mean gradient 26 mmHg, aortic valve area by continuing T equation of 0.8 cm, moderate left atrial enlargement.  On last office visit with Dr. Stanford Breed on 04/11/2017 it was noted that the patient would like to avoid any procedures of possible concerning aortic valve surgery.  However he was noted to be a possible candidate for TAVR  in the future if he developed symptoms.  Echocardiogram was repeated revealing no changes in her echo from prior study.   - Left ventricle: The cavity size was normal. Systolic function was   severely reduced. The estimated ejection fraction was in the   range of 25% to 30%. Wall motion was normal; there were no   regional wall motion abnormalities. Doppler parameters are   consistent with a reversible restrictive pattern, indicative of   decreased left ventricular diastolic compliance and/or increased   left atrial pressure (grade 3 diastolic dysfunction). - Aortic valve: Valve mobility was restricted. There was moderate   to severe stenosis. There was mild regurgitation. Peak velocity   (S): 320 cm/s. Mean gradient (S): 25 mm Hg. EF reduction (cardiac   output reduction) underestimates aortic stenosis severity. If   using dimensionless index, aortic stenosis is severe. - Left atrium: The atrium was mildly dilated.  The patient comes today accompanied by son-in-law, with his daughter on the phone, and also a letter written by his son-in-law with multiple questions, and  symptoms.  The patient has a history of PTSD and often has hyperventilation and panic attacks.  The patient also has some  mild dementia.  Family members are concerned about worsening symptoms of hyperventilation, confusion, and if this is related to his aortic valve disease versus PTSD.  The patient's family has several concerns.  But upon talking with the patient he continues to have some rambling thoughts as well as concerns about his breathing status.  He denies chest pain or dizziness but often is short of breath.  He cannot tell me if this is occurring with or without exertion.   Past Medical History:  Diagnosis Date  . Anxiety   . Aortic stenosis   . Benign neoplasm of colon   . Cancer ([place])    left cheek  . Diabetes mellitus   . Diverticulosis of colon (without mention of hemorrhage) 02/03/2005  . Hemorrhoids 02/03/2005   Internal and external  . History of Lyme disease   . HTN (hypertension)   . Hyperlipemia   . Personal history of colonic polyps   . Phlebitis 06-24-2014   left leg  . Polymyalgia rheumatica ([place])   . PTSD (post-traumatic stress disorder)   . Spinal stenosis   . Thalassemia   . Varicose veins     Past Surgical History:  Procedure Laterality Date  . CATARACT EXTRACTION, BILATERAL    . HEMORROIDECTOMY    . KIDNEY STONE SURGERY       Current Outpatient Medications  Medication Sig Dispense Refill  . aspirin 81 MG tablet Take 81 mg by mouth daily.    Marland Kitchen  Coenzyme Q10 (CO Q-10 PO) Take 1 tablet by mouth daily.    Marland Kitchen escitalopram (LEXAPRO) 20 MG tablet Take 20 mg by mouth daily.    Marland Kitchen glipiZIDE (GLUCOTROL) 5 MG tablet Take 5 mg by mouth as needed.     Marland Kitchen LORazepam (ATIVAN) 0.5 MG tablet Take 0.5 mg by mouth 2 (two) times daily.    . Melatonin 5 MG TABS Take 1 tablet by mouth daily.    . Menaquinone-7 (VITAMIN K2 PO) Take 300 mg by mouth daily.    . metFORMIN (GLUCOPHAGE) 500 MG tablet Take 1,000 mg by mouth 2 (two) times daily with a meal.    . metoprolol  succinate (TOPROL-XL) 25 MG 24 hr tablet Take 25 mg by mouth 2 (two) times daily.    . multivitamin-lutein (OCUVITE-LUTEIN) CAPS capsule Take 2 capsules by mouth daily.     . pantoprazole (PROTONIX) 40 MG tablet Take 1 tablet by mouth every morning. 30 tablet 11  . prednisoLONE 5 MG TABS tablet Take 10 mg by mouth daily.    . predniSONE (DELTASONE) 5 MG tablet TAKE 1 TABLET(5 MG) BY MOUTH DAILY WITH BREAKFAST 90 tablet 0   No current facility-administered medications for this visit.     Allergies:   Cephalosporins and Lamisil af defense [tolnaftate]    Social History:  The patient  reports that he has quit smoking. His smoking use included cigars. He quit after 78.00 years of use. He has never used smokeless tobacco. He reports that he does not drink alcohol or use drugs.   Family History:  The patient's family history includes Arthritis in his mother; Diabetes in his father and sister; Hypertension in his mother; Leukemia in his mother.    ROS: All other systems are reviewed and negative. Unless otherwise mentioned in H&P    PHYSICAL EXAM: VS:  There were no vitals taken for this visit. , BMI There is no height or weight on file to calculate BMI. GEN: Well nourished, well developed, in no acute distress  HEENT: normal  Neck: no JVD, radiation carotid bruits, or masses Cardiac: RRR; high-pitched holosystolic murmur, rubs, or gallops,no edema  Respiratory: Some bibasilar crackles. GI: soft, nontender, nondistended, + BS MS: no deformity or atrophy  Skin: warm and dry, no rash.  Very pale Neuro:  Strength and sensation are intact Psych: euthymic mood, full affect, confused thoughts and rambling verbiage.    EKG: Normal sinus rhythm with nonspecific ST-T wave abnormality noted inferior and laterally heart rate is 64 bpm.  Recent Labs: 04/03/2017: BUN 28; Creat 1.35; Potassium 5.6; Sodium 140    Lipid Panel    Component Value Date/Time   CHOL 175 03/01/2012 1152   TRIG 231.0  (H) 03/01/2012 1152   HDL 37.60 (L) 03/01/2012 1152   CHOLHDL 5 03/01/2012 1152   VLDL 46.2 (H) 03/01/2012 1152   LDLDIRECT 102.9 03/01/2012 1152      Wt Readings from Last 3 Encounters:  06/21/17 153 lb (69.4 kg)  04/11/17 164 lb 12.8 oz (74.8 kg)  04/03/17 164 lb (74.4 kg)      Other studies Reviewed: 04/21/2017 Left ventricle: The cavity size was normal. Systolic function was   severely reduced. The estimated ejection fraction was in the   range of 25% to 30%. Wall motion was normal; there were no   regional wall motion abnormalities. Doppler parameters are   consistent with a reversible restrictive pattern, indicative of   decreased left ventricular diastolic compliance and/or increased  left atrial pressure (grade 3 diastolic dysfunction). - Aortic valve: Valve mobility was restricted. There was moderate   to severe stenosis. There was mild regurgitation. Peak velocity   (S): 320 cm/s. Mean gradient (S): 25 mm Hg. EF reduction (cardiac   output reduction) underestimates aortic stenosis severity. If   using dimensionless index, aortic stenosis is severe. - Left atrium: The atrium was mildly dilated.  ASSESSMENT AND PLAN:  1.  Severe aortic valve stenosis: At this time the patient is not a candidate for TAVR due to his reduced LV systolic function.  Uncertain if his gradients are correct in this setting with an EF of 25%.  In the past the patient has stated that he did not want invasive valvular repair.  Family members are concerned about his symptoms associated with his aortic valve stenosis.  I believe his symptoms are multifactorial and can compound upon themselves.  The patient has severely reduced ejection fraction of 25% with aortic valve stenosis which can cause some shortness of breath, chest pressure.  This may begin with these symptoms and exacerbate into panic attacks and hyperventilation in the setting of PTSD.  This is been an ongoing cycle for him.  2. HFrEF: Due  to recent echocardiogram revealing significantly reduced ejection fraction I am going to add Entresto 2426 mg, samples are provided to him.  I have advised the patient that his blood pressure will be much lower than what it currently is being recorded today.  This may cause some mild dizziness initially.  I have also explained this to the family so that they can be aware of these type of symptoms.  I am going to check a be met in 1 week to evaluate his kidney function.  Heart rate is well controlled currently we will add Spironolactone 12.5 mg daily to his regimen.  I do not want to add Lasix at this time until I am certain about his chemistries.  3.  Hypertension: Not well controlled in the setting of aortic valve stenosis and reduced ejection fraction.  I will provide afterload medication in the form of Entresto.  Discussion in the past had been to add a low dose of benazepril however I am going to try [facility] as this does have better outcomes concerning blood pressure control and reduced EF within this patient population.  I have reviewed most recent labs completed on 04/03/2017, creatinine 1.35, potassium 5.6, sodium 140.  1 week follow-up BMET has been planned.  4.  PTSD with hyperventilation and anxiety.  He is being followed by Dr. Birdena Crandall.  Psychiatry who has placed him on lorazepam 0.5 mg 2 times daily, and Lexapro 20 mg daily.  I have asked him to stop taking melatonin as this can cause hallucinations and nightmares.  5.  Diabetes: Followed by primary care physician: He is continued on glipizide and metformin.  6.  GERD: He remains on pantoprazole 40 mg daily.  Checking labs.  Current medicines are reviewed at length with the patient today.  It is difficult to communicate with this patient as he is having wandering thoughts and is not staying focused on questions.  71 of conversation is conducted with his family via the phone and in person.  I have answered multiple questions and  given significant explanations concerning his current cardiac status.  I have explained the medication and reasons for discontinuing or adding.  I have explained his reduced EF and aortic valve stenosis. I do not believe at this time he  is a candidate for TAVR. Will see him again in a couple of weeks to assess his response to medications.  I have spent greater that 45 minutes with the patient and family at this visit today.   Labs/ tests ordered today include: BMET  Phill Myron. West Pugh, ANP, AACC   07/10/2017 1:19 PM    [facility] [address] [place], [place] [address] Phone: [phone]; Fax: [phone]

## 2017-07-11 ENCOUNTER — Encounter: Payer: Self-pay | Admitting: Adult Health

## 2017-07-11 ENCOUNTER — Ambulatory Visit (INDEPENDENT_AMBULATORY_CARE_PROVIDER_SITE_OTHER): Payer: Medicare Other | Admitting: Adult Health

## 2017-07-11 VITALS — BP 150/88 | HR 64 | Ht 66.0 in | Wt 162.0 lb

## 2017-07-11 DIAGNOSIS — I43 Cardiomyopathy in diseases classified elsewhere: Secondary | ICD-10-CM | POA: Diagnosis not present

## 2017-07-11 DIAGNOSIS — I10 Essential (primary) hypertension: Secondary | ICD-10-CM

## 2017-07-11 DIAGNOSIS — I5023 Acute on chronic systolic (congestive) heart failure: Secondary | ICD-10-CM

## 2017-07-11 DIAGNOSIS — I350 Nonrheumatic aortic (valve) stenosis: Secondary | ICD-10-CM | POA: Diagnosis not present

## 2017-07-11 DIAGNOSIS — Z79899 Other long term (current) drug therapy: Secondary | ICD-10-CM | POA: Diagnosis not present

## 2017-07-11 MED ORDER — SACUBITRIL-VALSARTAN 24-26 MG PO TABS: 1.0000 | ORAL_TABLET | Freq: Two times a day (BID) | ORAL | 2 refills | Status: DC

## 2017-07-11 MED ORDER — SPIRONOLACTONE 25 MG PO TABS: 12.5000 mg | ORAL_TABLET | Freq: Every day | ORAL | 2 refills | Status: DC

## 2017-07-11 NOTE — Patient Instructions (Signed)
Medication Instructions:  START ENTRESTO 24-26MG  TWICE DAILY  START SPIRONOLACTONE 123.5MG  (1/2 TAB-25MG  TAB)  If you need a refill on your cardiac medications before your next appointment, please call your pharmacy.  Labwork: BMET IN 1 WEEK HERE IN OUR OFFICE AT [facility]  Take the provided lab slips with you to the lab for your blood draw.   You will NOT need to fast   Follow-Up: Your physician wants you to follow-up in: 2 [place] ([place]), DNP,AACC IF PRIMARY CARDIOLOGIST IS UNAVAILABLE.    Thank you for choosing [facility] at [facility]!!

## 2017-07-13 ENCOUNTER — Telehealth: Payer: Self-pay | Admitting: Adult Health

## 2017-07-13 NOTE — Telephone Encounter (Signed)
New Message:      Cover my Meds is needing prior authorization for sacubitril-valsartan (ENTRESTO) 24-26 MG

## 2017-07-14 ENCOUNTER — Ambulatory Visit: Payer: Medicare Other | Admitting: Podiatry

## 2017-07-14 NOTE — Telephone Encounter (Signed)
PA for entresto started at cover my meds.

## 2017-07-21 DIAGNOSIS — Z79899 Other long term (current) drug therapy: Secondary | ICD-10-CM | POA: Diagnosis not present

## 2017-07-21 DIAGNOSIS — I350 Nonrheumatic aortic (valve) stenosis: Secondary | ICD-10-CM | POA: Diagnosis not present

## 2017-07-22 LAB — BASIC METABOLIC PANEL
BUN/Creatinine Ratio: 20 (ref 10–24)
BUN: 26 mg/dL (ref 10–36)
CHLORIDE: 107 mmol/L — AB (ref 96–106)
CO2: 19 mmol/L — AB (ref 20–29)
CREATININE: 1.33 mg/dL — AB (ref 0.76–1.27)
Calcium: 7.3 mg/dL — ABNORMAL LOW (ref 8.6–10.2)
GFR calc Af Amer: 53 mL/min/{1.73_m2} — ABNORMAL LOW (ref >59)
GFR calc non Af Amer: 45 mL/min/{1.73_m2} — ABNORMAL LOW (ref >59)
GLUCOSE: 124 mg/dL — AB (ref 65–99)
Potassium: 4.8 mmol/L (ref 3.5–5.2)
SODIUM: 143 mmol/L (ref 134–144)

## 2017-07-24 NOTE — Progress Notes (Deleted)
Cardiology Office Note   Date:  07/24/2017   ID:  Antonio Buchanan, DOB September 25, 1921, MRN [id number]  PCP:  Leanna Battles, MD  Cardiologist:  Lubertha South  No chief complaint on file.    History of Present Illness: Antonio Buchanan is a 84 y.o. male who presents for ongoing assessment and management of severe aortic valve stenosis, with an aortic valve area by continuing equation of 0.8 cm, mean gradient of 26 mmHg and, with moderate left atrial enlargement per echocardiogram in 2017.  I saw the patient last on 07/11/2017 with family members by phone and in person asking multiple questions concerning prognosis and plan candidacy for TAVR.  Due to PTSD, frequent anxiety with hyperventilation and panic attacks along with dementia, the patient was not found to be a candidate for aortic valve repair after discussion with Dr. Stanford Breed.  Due to significantly reduced ejection fraction of 25% to 30% per echocardiogram, along with grade 3 diastolic dysfunction, low-dose Entresto was started on this patient to evaluate his response to medication and symptom management with close follow-up in the setting of aortic valve stenosis.  He was believed his symptoms are multifactorial and medical management was recommended.    Past Medical History:  Diagnosis Date  . Anxiety   . Aortic stenosis   . Benign neoplasm of colon   . Cancer ([place])    left cheek  . Diabetes mellitus   . Diverticulosis of colon (without mention of hemorrhage) 02/03/2005  . Hemorrhoids 02/03/2005   Internal and external  . History of Lyme disease   . HTN (hypertension)   . Hyperlipemia   . Personal history of colonic polyps   . Phlebitis 06-24-2014   left leg  . Polymyalgia rheumatica ([place])   . PTSD (post-traumatic stress disorder)   . Spinal stenosis   . Thalassemia   . Varicose veins     Past Surgical History:  Procedure Laterality Date  . CATARACT EXTRACTION, BILATERAL    . HEMORROIDECTOMY    . KIDNEY  STONE SURGERY       Current Outpatient Medications  Medication Sig Dispense Refill  . Coenzyme Q10 (CO Q-10 PO) Take 1 tablet by mouth daily.    Marland Kitchen escitalopram (LEXAPRO) 20 MG tablet Take 20 mg by mouth daily.    Marland Kitchen glipiZIDE (GLUCOTROL) 5 MG tablet Take 5 mg by mouth as needed.     Marland Kitchen LORazepam (ATIVAN) 0.5 MG tablet Take 0.5 mg by mouth 2 (two) times daily.    . Menaquinone-7 (VITAMIN K2 PO) Take 300 mg by mouth daily.    . metFORMIN (GLUCOPHAGE) 500 MG tablet Take 1,000 mg by mouth 2 (two) times daily with a meal.    . metoprolol succinate (TOPROL-XL) 25 MG 24 hr tablet Take 25 mg by mouth 2 (two) times daily.    . multivitamin-lutein (OCUVITE-LUTEIN) CAPS capsule Take 2 capsules by mouth daily.     . pantoprazole (PROTONIX) 40 MG tablet Take 1 tablet by mouth every morning. 30 tablet 11  . prednisoLONE 5 MG TABS tablet Take 10 mg by mouth daily.    . sacubitril-valsartan (ENTRESTO) 24-26 MG Take 1 tablet by mouth 2 (two) times daily. 60 tablet 2  . spironolactone (ALDACTONE) 25 MG tablet Take 0.5 tablets (12.5 mg total) by mouth daily. 15 tablet 2   No current facility-administered medications for this visit.     Allergies:   Cephalosporins and Lamisil af defense [tolnaftate]    Social History:  The patient  reports that he  has quit smoking. His smoking use included cigars. He quit after 78.00 years of use. He has never used smokeless tobacco. He reports that he does not drink alcohol or use drugs.   Family History:  The patient's family history includes Arthritis in his mother; Diabetes in his father and sister; Hypertension in his mother; Leukemia in his mother.    ROS: All other systems are reviewed and negative. Unless otherwise mentioned in H&P    PHYSICAL EXAM: VS:  There were no vitals taken for this visit. , BMI There is no height or weight on file to calculate BMI. GEN: Well nourished, well developed, in no acute distress  HEENT: normal  Neck: no JVD, carotid bruits,  or masses Cardiac: ***RRR; no murmurs, rubs, or gallops,no edema  Respiratory:  clear to auscultation bilaterally, normal work of breathing GI: soft, nontender, nondistended, + BS MS: no deformity or atrophy  Skin: warm and dry, no rash Neuro:  Strength and sensation are intact Psych: euthymic mood, full affect   EKG:  EKG {ACTION; IS/IS [id number]} ordered today. The ekg ordered today demonstrates ***   Recent Labs: 07/21/2017: BUN 26; Creatinine, Ser 1.33; Potassium 4.8; Sodium 143    Lipid Panel    Component Value Date/Time   CHOL 175 03/01/2012 1152   TRIG 231.0 (H) 03/01/2012 1152   HDL 37.60 (L) 03/01/2012 1152   CHOLHDL 5 03/01/2012 1152   VLDL 46.2 (H) 03/01/2012 1152   LDLDIRECT 102.9 03/01/2012 1152      Wt Readings from Last 3 Encounters:  07/11/17 162 lb (73.5 kg)  06/21/17 153 lb (69.4 kg)  04/11/17 164 lb 12.8 oz (74.8 kg)      Other studies Reviewed: Additional studies/ records that were reviewed today include: ***. Review of the above records demonstrates: ***   ASSESSMENT AND PLAN:  1.  ***   Current medicines are reviewed at length with the patient today.    Labs/ tests ordered today include: *** Phill Myron. West Pugh, ANP, AACC   07/24/2017 5:18 PM    [facility] [address], [place], [place] [address] Phone: [phone]; Fax: [phone]

## 2017-07-25 ENCOUNTER — Telehealth: Payer: Self-pay | Admitting: Cardiology

## 2017-07-25 ENCOUNTER — Ambulatory Visit: Payer: Medicare Other | Admitting: Adult Health

## 2017-07-25 NOTE — Telephone Encounter (Signed)
Thank you :)

## 2017-07-25 NOTE — Telephone Encounter (Signed)
Spoke to daughter , she states patient was not feeling well today move appt to 5/2/0/19  she had question  - is possible regurgitation episodic -is posture or sleeping affect regurg. --should patient be on a vegan or another diet --should family purchase  Oxygen saturation equip. To check oxygen, will he need oxygen.  RN ANSWERED ALL DAUGHTER QUESTIONS . RECOMMEND IF ANY OTHER QUESTION COME UP BRING TO OFFICE VISIT. VERBALIZED UNDERSTANDING.

## 2017-07-25 NOTE — Telephone Encounter (Signed)
New Message:       Pt's daughter is calling pertaining to pt's appt today and needing to know if it needs to be rescheduled, due to the pt being here just two weeks ago.

## 2017-08-07 ENCOUNTER — Encounter: Payer: Self-pay | Admitting: Adult Health

## 2017-08-07 ENCOUNTER — Ambulatory Visit (INDEPENDENT_AMBULATORY_CARE_PROVIDER_SITE_OTHER): Payer: Medicare Other | Admitting: Adult Health

## 2017-08-07 VITALS — BP 138/80 | HR 75 | Ht 66.0 in | Wt 158.8 lb

## 2017-08-07 DIAGNOSIS — Z79899 Other long term (current) drug therapy: Secondary | ICD-10-CM | POA: Diagnosis not present

## 2017-08-07 DIAGNOSIS — I10 Essential (primary) hypertension: Secondary | ICD-10-CM | POA: Diagnosis not present

## 2017-08-07 DIAGNOSIS — I350 Nonrheumatic aortic (valve) stenosis: Secondary | ICD-10-CM

## 2017-08-07 DIAGNOSIS — I43 Cardiomyopathy in diseases classified elsewhere: Secondary | ICD-10-CM | POA: Diagnosis not present

## 2017-08-07 DIAGNOSIS — I519 Heart disease, unspecified: Secondary | ICD-10-CM

## 2017-08-07 MED ORDER — METOPROLOL SUCCINATE ER 25 MG PO TB24: 25.0000 mg | ORAL_TABLET | Freq: Two times a day (BID) | ORAL | 5 refills | Status: DC

## 2017-08-07 MED ORDER — SACUBITRIL-VALSARTAN 24-26 MG PO TABS: 1.0000 | ORAL_TABLET | Freq: Two times a day (BID) | ORAL | 2 refills | Status: AC

## 2017-08-07 MED ORDER — SPIRONOLACTONE 25 MG PO TABS: 12.5000 mg | ORAL_TABLET | Freq: Every day | ORAL | 3 refills | Status: AC

## 2017-08-07 NOTE — Progress Notes (Signed)
Cardiology Office Note   Date:  08/07/2017   ID:  Antonio Buchanan, DOB [birth year], MRN [id number]  PCP:  Leanna Battles, MD  Cardiologist:  Stanford Breed   Chief Complaint  Patient presents with  . Follow-up    swelling in feet, SOB noted, blood pressure medication and montioring  . Congestive Heart Failure  . Cardiomyopathy  . Aortic Stenosis     History of Present Illness: Antonio Buchanan is a 84 y.o. male who presents for ongoing assessment and management of severe aortic valve stenosis with a mean gradient of 26 mmHg, valve area of 0.8 cm, (not a surgical candidate) chronic systolic heart failure with an EF of 25% to 30% per echo on.  04/21/2017.  On last office visit multiple family members had multiple questions which were all answered spending extensive period of time with the patient.  The patient was started on Entresto 24mg  / 26 mg as blood pressure was not well controlled in the setting of reduced EF.  Spironolactone 12.5 mg daily was also added.  Follow-up BMET was ordered.  Labs on 07/21/2017, sodium 143, potassium 4.0, chloride 107, CO2 19, glucose 124, BUN 26, creatinine 1.33.  Since being seen last family members have called with a series of questions concerning his aortic valve prognosis and plan.  All of which were answered.  Other questions concerning oxygen and diet along with sleeping posture should be addressed by PCP.  It is noted, that the patient has significant dementia with PTSD.  He comes again with family member who has a written list of questions. The patient is unable to tell me if he is feeling better or not. He has significant confusion and inappropriate verbalizations.   Past Medical History:  Diagnosis Date  . Anxiety   . Aortic stenosis   . Benign neoplasm of colon   . Cancer ([place])    left cheek  . Diabetes mellitus   . Diverticulosis of colon (without mention of hemorrhage) 02/03/2005  . Hemorrhoids 02/03/2005   Internal and external    . History of Lyme disease   . HTN (hypertension)   . Hyperlipemia   . Personal history of colonic polyps   . Phlebitis 06-24-2014   left leg  . Polymyalgia rheumatica ([place])   . PTSD (post-traumatic stress disorder)   . Spinal stenosis   . Thalassemia   . Varicose veins     Past Surgical History:  Procedure Laterality Date  . CATARACT EXTRACTION, BILATERAL    . HEMORROIDECTOMY    . KIDNEY STONE SURGERY       Current Outpatient Medications  Medication Sig Dispense Refill  . Coenzyme Q10 (CO Q-10 PO) Take 1 tablet by mouth daily.    Marland Kitchen escitalopram (LEXAPRO) 20 MG tablet Take 20 mg by mouth daily.    Marland Kitchen glipiZIDE (GLUCOTROL) 5 MG tablet Take 5 mg by mouth as needed.     Marland Kitchen LORazepam (ATIVAN) 0.5 MG tablet Take 0.5 mg by mouth 2 (two) times daily.    . metFORMIN (GLUCOPHAGE) 500 MG tablet Take 1,000 mg by mouth 2 (two) times daily with a meal.    . metoprolol succinate (TOPROL-XL) 25 MG 24 hr tablet Take 1 tablet (25 mg total) by mouth 2 (two) times daily. 60 tablet 5  . pantoprazole (PROTONIX) 40 MG tablet Take 1 tablet by mouth every morning. 30 tablet 11  . prednisoLONE 5 MG TABS tablet Take 10 mg by mouth daily.    . sacubitril-valsartan (ENTRESTO) 24-26 MG Take  1 tablet by mouth 2 (two) times daily. 60 tablet 2  . spironolactone (ALDACTONE) 25 MG tablet Take 0.5 tablets (12.5 mg total) by mouth daily. 15 tablet 3  . Menaquinone-7 (VITAMIN K2 PO) Take 300 mg by mouth daily.    . multivitamin-lutein (OCUVITE-LUTEIN) CAPS capsule Take 2 capsules by mouth daily.      No current facility-administered medications for this visit.     Allergies:   Cephalosporins and Lamisil af defense [tolnaftate]    Social History:  The patient  reports that he has quit smoking. His smoking use included cigars. He quit after 78.00 years of use. He has never used smokeless tobacco. He reports that he does not drink alcohol or use drugs.   Family History:  The patient's family history includes  Arthritis in his mother; Diabetes in his father and sister; Hypertension in his mother; Leukemia in his mother.    ROS: All other systems are reviewed and negative. Unless otherwise mentioned in H&P    PHYSICAL EXAM: VS:  BP 138/80 (BP Location: Left Arm)   Pulse 75   Ht 5\' 6"  (1.676 m)   Wt 158 lb 12.8 oz (72 kg)   SpO2 97%   BMI 25.63 kg/m  , BMI Body mass index is 25.63 kg/m. GEN: Well nourished, well developed, in no acute distress Disheveled.  HEENT: normal  Neck: no JVD, carotid bruits, or masses Cardiac: RRR; high pitched systolic holosystolic murmur rubs, or gallops,no edema  Respiratory:  Diminished in the bases, no wheezing or rhonchi, GI: soft, nontender, nondistended, + BS MS: no deformity or atrophy No edema. Some erythema noted in the LE,  Skin: warm and dry, no rash Neuro:  Strength and sensation are intact, hard of hearing.  Psych: Confusion with inappropriate speech and thought processes. Frequent cursing.    EKG:  Not competed on this office visit.   Recent Labs: 07/21/2017: BUN 26; Creatinine, Ser 1.33; Potassium 4.8; Sodium 143    Lipid Panel    Component Value Date/Time   CHOL 175 03/01/2012 1152   TRIG 231.0 (H) 03/01/2012 1152   HDL 37.60 (L) 03/01/2012 1152   CHOLHDL 5 03/01/2012 1152   VLDL 46.2 (H) 03/01/2012 1152   LDLDIRECT 102.9 03/01/2012 1152      Wt Readings from Last 3 Encounters:  08/07/17 158 lb 12.8 oz (72 kg)  07/11/17 162 lb (73.5 kg)  06/21/17 153 lb (69.4 kg)      Other studies Reviewed: Echocardiogram May 16, 2017  Left ventricle: The cavity size was normal. Systolic function was   severely reduced. The estimated ejection fraction was in the   range of 25% to 30%. Wall motion was normal; there were no   regional wall motion abnormalities. Doppler parameters are   consistent with a reversible restrictive pattern, indicative of   decreased left ventricular diastolic compliance and/or increased   left atrial pressure (grade 3  diastolic dysfunction). - Aortic valve: Valve mobility was restricted. There was moderate   to severe stenosis. There was mild regurgitation. Peak velocity   (S): 320 cm/s. Mean gradient (S): 25 mm Hg. EF reduction (cardiac   output reduction) underestimates aortic stenosis severity. If   using dimensionless index, aortic stenosis is severe. - Left atrium: The atrium was mildly dilated.  ASSESSMENT AND PLAN:  1.  HFrEF: He is now on Entresto, metoprolol, and spironolactone  I have reviewed labs, kidney function is stable. I have answered multiple questions again. He is to weigh daily and avoid  salted foods. Repeat echo in 3 months to evaluate for improvement in LV fx.   2. Severe Aortic Stenosis: He is not a candidate for AoV replacement due to co-morbidities and age. I have explained this again to family member in person and to daughter who is on the phone.  3. Hypertension: Elevated today, not optimal for reduced EF. I have asked family to keep track of BP. Range <120/65 for best cardiac output, with AoV stenosis. He has been given a BP recording sheet and family is asked to take BP once a day at the same time.   4. Poor body hygiene: He is asked to wear socks with his shoes and to wear clean shoes to avoid infection. Family members verbalize understanding.   Current medicines are reviewed at length with the patient today. Refills are provided on medications and samples of Entresto are provided as well.  Multiple questions are answered. Instructions given in written and verbal form.   Labs/ tests ordered today include: Echo in 3 months.   Phill Myron. West Pugh, ANP, AACC   08/07/2017 2:35 PM    [facility] [address], [place], [place] [address] Phone: [phone]; Fax: [phone]

## 2017-08-07 NOTE — Patient Instructions (Signed)
Medication Instructions:  NO CHANGES- Your physician recommends that you continue on your current medications as directed. Please refer to the Current Medication list given to you today.  If you need a refill on your cardiac medications before your next appointment, please call your pharmacy.  Testing/Procedures: Echocardiogram - Your physician has requested that you have an echocardiogram. Echocardiography is a painless test that uses sound waves to create images of your heart. It provides your doctor with information about the size and shape of your heart and how well your heart's chambers and valves are working. This procedure takes approximately one hour. There are no restrictions for this procedure. This will be performed at our [facility] location - [address], [address].  Special Instructions:  Echocardiogram - Your physician has requested that you have an echocardiogram. Echocardiography is a painless test that uses sound waves to create images of your heart. It provides your doctor with information about the size and shape of your heart and how well your heart's chambers and valves are working. This procedure takes approximately one hour. There are no restrictions for this procedure. This will be performed at our [facility] location - [address].  PLEASE TAKE AND LOG BP ONCE DAILY TARGET BP<120/65  PLEASE TAKE AND LOG WEIGHT DAILY  Follow-Up: Your physician wants you to follow-up in: 3 MONTHS WITH DR CRENSHAW(AFTER ECHO)   Thank you for choosing [facility] at [facility]!!      Low-Sodium Eating Plan Sodium, which is an element that makes up salt, helps you maintain a healthy balance of fluids in your body. Too much sodium can increase your blood pressure and cause fluid and waste to be held in your body. Your health care provider or dietitian may recommend following this plan if you have high blood pressure (hypertension), kidney disease, liver disease, or  heart failure. Eating less sodium can help lower your blood pressure, reduce swelling, and protect your heart, liver, and kidneys. What are tips for following this plan? General guidelines  Most people on this plan should limit their sodium intake to 1,500-2,000 mg (milligrams) of sodium each day. Reading food labels  The Nutrition Facts label lists the amount of sodium in one serving of the food. If you eat more than one serving, you must multiply the listed amount of sodium by the number of servings.  Choose foods with less than 140 mg of sodium per serving.  Avoid foods with 300 mg of sodium or more per serving. Shopping  Look for lower-sodium products, often labeled as "low-sodium" or "no salt added."  Always check the sodium content even if foods are labeled as "unsalted" or "no salt added".  Buy fresh foods. ? Avoid canned foods and premade or frozen meals. ? Avoid canned, cured, or processed meats  Buy breads that have less than 80 mg of sodium per slice. Cooking  Eat more home-cooked food and less restaurant, buffet, and fast food.  Avoid adding salt when cooking. Use salt-free seasonings or herbs instead of table salt or sea salt. Check with your health care provider or pharmacist before using salt substitutes.  Cook with plant-based oils, such as canola, sunflower, or olive oil. Meal planning  When eating at a restaurant, ask that your food be prepared with less salt or no salt, if possible.  Avoid foods that contain MSG (monosodium glutamate). MSG is sometimes added to [place] food, bouillon, and some canned foods. What foods are recommended? The  items listed may not be a complete list. Talk with your dietitian about what dietary choices are best for you. Grains Low-sodium cereals, including oats, puffed wheat and rice, and shredded wheat. Low-sodium crackers. Unsalted rice. Unsalted pasta. Low-sodium bread. Whole-grain breads and whole-grain pasta. Vegetables Fresh  or frozen vegetables. "No salt added" canned vegetables. "No salt added" tomato sauce and paste. Low-sodium or reduced-sodium tomato and vegetable juice. Fruits Fresh, frozen, or canned fruit. Fruit juice. Meats and other protein foods Fresh or frozen (no salt added) meat, poultry, seafood, and fish. Low-sodium canned tuna and salmon. Unsalted nuts. Dried peas, beans, and lentils without added salt. Unsalted canned beans. Eggs. Unsalted nut butters. Dairy Milk. Soy milk. Cheese that is naturally low in sodium, such as ricotta cheese, fresh mozzarella, or Swiss cheese Low-sodium or reduced-sodium cheese. Cream cheese. Yogurt. Fats and oils Unsalted butter. Unsalted margarine with no trans fat. Vegetable oils such as canola or olive oils. Seasonings and other foods Fresh and dried herbs and spices. Salt-free seasonings. Low-sodium mustard and ketchup. Sodium-free salad dressing. Sodium-free light mayonnaise. Fresh or refrigerated horseradish. Lemon juice. Vinegar. Homemade, reduced-sodium, or low-sodium soups. Unsalted popcorn and pretzels. Low-salt or salt-free chips. What foods are not recommended? The items listed may not be a complete list. Talk with your dietitian about what dietary choices are best for you. Grains Instant hot cereals. Bread stuffing, pancake, and biscuit mixes. Croutons. Seasoned rice or pasta mixes. Noodle soup cups. Boxed or frozen macaroni and cheese. Regular salted crackers. Self-rising flour. Vegetables Sauerkraut, pickled vegetables, and relishes. Olives. [place] fries. Onion rings. Regular canned vegetables (not low-sodium or reduced-sodium). Regular canned tomato sauce and paste (not low-sodium or reduced-sodium). Regular tomato and vegetable juice (not low-sodium or reduced-sodium). Frozen vegetables in sauces. Meats and other protein foods Meat or fish that is salted, canned, smoked, spiced, or pickled. Bacon, ham, sausage, hotdogs, corned beef, chipped beef, packaged  lunch meats, salt pork, jerky, pickled herring, anchovies, regular canned tuna, sardines, salted nuts. Dairy Processed cheese and cheese spreads. Cheese curds. Blue cheese. Feta cheese. String cheese. Regular cottage cheese. Buttermilk. Canned milk. Fats and oils Salted butter. Regular margarine. Ghee. Bacon fat. Seasonings and other foods Onion salt, garlic salt, seasoned salt, table salt, and sea salt. Canned and packaged gravies. Worcestershire sauce. Tartar sauce. Barbecue sauce. Teriyaki sauce. Soy sauce, including reduced-sodium. Steak sauce. Fish sauce. Oyster sauce. Cocktail sauce. Horseradish that you find on the shelf. Regular ketchup and mustard. Meat flavorings and tenderizers. Bouillon cubes. Hot sauce and Tabasco sauce. Premade or packaged marinades. Premade or packaged taco seasonings. Relishes. Regular salad dressings. Salsa. Potato and tortilla chips. Corn chips and puffs. Salted popcorn and pretzels. Canned or dried soups. Pizza. Frozen entrees and pot pies. Summary  Eating less sodium can help lower your blood pressure, reduce swelling, and protect your heart, liver, and kidneys.  Most people on this plan should limit their sodium intake to 1,500-2,000 mg (milligrams) of sodium each day.  Canned, boxed, and frozen foods are high in sodium. Restaurant foods, fast foods, and pizza are also very high in sodium. You also get sodium by adding salt to food.  Try to cook at home, eat more fresh fruits and vegetables, and eat less fast food, canned, processed, or prepared foods. This information is not intended to replace advice given to you by your health care provider. Make sure you discuss any questions you have with your health care provider. Document Released: 08/27/2001 Document Revised: 02/29/2016 Document Reviewed: 02/29/2016 Elsevier Interactive  Patient Education  [employer].

## 2017-08-08 ENCOUNTER — Other Ambulatory Visit: Payer: Self-pay

## 2017-08-08 ENCOUNTER — Ambulatory Visit (INDEPENDENT_AMBULATORY_CARE_PROVIDER_SITE_OTHER): Payer: Medicare Other | Admitting: Vascular Surgery

## 2017-08-08 ENCOUNTER — Encounter: Payer: Self-pay | Admitting: Vascular Surgery

## 2017-08-08 VITALS — BP 129/81 | HR 66 | Temp 97.0°F | Resp 20 | Ht 66.0 in | Wt 157.0 lb

## 2017-08-08 DIAGNOSIS — I872 Venous insufficiency (chronic) (peripheral): Secondary | ICD-10-CM

## 2017-08-08 NOTE — Progress Notes (Signed)
Vascular and Vein Specialist of [facility]  Patient name: Antonio Buchanan MRN: [id number] DOB: 05-27-1921 Sex: male  REASON FOR VISIT: Follow-up venous hypertension  HPI: Antonio Buchanan is a 84 y.o. male here today for follow-up.  He has disability with that Workmen's Compensation and needs a yearly visit for documentation of this.  He does have long history of severe venous hypertension left greater than right.  Fortunately he has not had any new episodes of cellulitis.  He does elevate his legs when possible and does wear a thigh-high graduated compression garments.  He is here today with son-in-law.  He reports that he is having new cardiac difficulty with sounds to be related to valvular dysfunction.  Past Medical History:  Diagnosis Date  . Anxiety   . Aortic stenosis   . Benign neoplasm of colon   . Cancer ([place])    left cheek  . Diabetes mellitus   . Diverticulosis of colon (without mention of hemorrhage) 02/03/2005  . Hemorrhoids 02/03/2005   Internal and external  . History of Lyme disease   . HTN (hypertension)   . Hyperlipemia   . Personal history of colonic polyps   . Phlebitis 06-24-2014   left leg  . Polymyalgia rheumatica ([place])   . PTSD (post-traumatic stress disorder)   . Spinal stenosis   . Thalassemia   . Varicose veins     Family History  Problem Relation Age of Onset  . Diabetes Father   . Leukemia Mother   . Hypertension Mother   . Arthritis Mother   . Diabetes Sister     SOCIAL HISTORY: Social History   Tobacco Use  . Smoking status: Former Smoker    Years: 78.00    Types: Cigars  . Smokeless tobacco: Never Used  Substance Use Topics  . Alcohol use: No    Alcohol/week: 0.0 oz    Allergies  Allergen Reactions  . Cephalosporins Hives and Itching  . Lamisil Af Defense [Tolnaftate]     Severe hives    Current Outpatient Medications  Medication Sig Dispense Refill  . Coenzyme Q10  (CO Q-10 PO) Take 1 tablet by mouth daily.    Marland Kitchen escitalopram (LEXAPRO) 20 MG tablet Take 20 mg by mouth daily.    Marland Kitchen glipiZIDE (GLUCOTROL) 5 MG tablet Take 5 mg by mouth as needed.     Marland Kitchen LORazepam (ATIVAN) 0.5 MG tablet Take 0.5 mg by mouth 2 (two) times daily.    . Menaquinone-7 (VITAMIN K2 PO) Take 300 mg by mouth daily.    . metFORMIN (GLUCOPHAGE) 500 MG tablet Take 1,000 mg by mouth 2 (two) times daily with a meal.    . metoprolol succinate (TOPROL-XL) 25 MG 24 hr tablet Take 1 tablet (25 mg total) by mouth 2 (two) times daily. 60 tablet 5  . multivitamin-lutein (OCUVITE-LUTEIN) CAPS capsule Take 2 capsules by mouth daily.     . pantoprazole (PROTONIX) 40 MG tablet Take 1 tablet by mouth every morning. 30 tablet 11  . prednisoLONE 5 MG TABS tablet Take 10 mg by mouth daily.    . sacubitril-valsartan (ENTRESTO) 24-26 MG Take 1 tablet by mouth 2 (two) times daily. 60 tablet 2  . spironolactone (ALDACTONE) 25 MG tablet Take 0.5 tablets (12.5 mg total) by mouth daily. 15 tablet 3   No current facility-administered medications for this visit.     REVIEW OF SYSTEMS:  [X]  denotes positive finding, [ ]  denotes negative finding Cardiac  Comments:  Chest pain or  chest pressure:    Shortness of breath upon exertion: x   Short of breath when lying flat: x   Irregular heart rhythm:        Vascular    Pain in calf, thigh, or hip brought on by ambulation: x   Pain in feet at night that wakes you up from your sleep:     Blood clot in your veins:    Leg swelling:  x         PHYSICAL EXAM: Vitals:   08/08/17 1430  BP: 129/81  Pulse: 66  Resp: 20  Temp: (!) 97 F (36.1 C)  TempSrc: Oral  SpO2: 98%  Weight: 157 lb (71.2 kg)  Height: 5\' 6"  (1.676 m)    GENERAL: The patient is a well-nourished male, in no acute distress. The vital signs are documented above. CARDIOVASCULAR: Marked varicosities over his left calf and ankle.  Less so on the right leg. PULMONARY: There is good air exchange    MUSCULOSKELETAL: There are no major deformities or cyanosis. NEUROLOGIC: No focal weakness or paresthesias are detected. SKIN: No open ulceration.  Area with chronic venous hypertension bilaterally with hemosiderin deposits PSYCHIATRIC: The patient has a normal affect.  DATA:  None  MEDICAL ISSUES: Stable venous hypertension bilaterally left greater than right.  Patient has been compliant with his compression and elevation I have encouraged him to continue this.  We will see him again in 1 year for repeat documentation    Rosetta Posner, MD [facility] Vascular and Vein Specialists of [facility] Tel [phone] Pager [phone]

## 2017-08-09 ENCOUNTER — Other Ambulatory Visit: Payer: Self-pay

## 2017-08-09 ENCOUNTER — Ambulatory Visit (HOSPITAL_COMMUNITY): Payer: Medicare Other | Attending: Cardiology

## 2017-08-09 DIAGNOSIS — I071 Rheumatic tricuspid insufficiency: Secondary | ICD-10-CM | POA: Diagnosis not present

## 2017-08-09 DIAGNOSIS — I519 Heart disease, unspecified: Secondary | ICD-10-CM

## 2017-08-15 DIAGNOSIS — M353 Polymyalgia rheumatica: Secondary | ICD-10-CM | POA: Diagnosis not present

## 2017-08-15 DIAGNOSIS — I7389 Other specified peripheral vascular diseases: Secondary | ICD-10-CM | POA: Diagnosis not present

## 2017-08-15 DIAGNOSIS — R2681 Unsteadiness on feet: Secondary | ICD-10-CM | POA: Diagnosis not present

## 2017-08-15 DIAGNOSIS — E1151 Type 2 diabetes mellitus with diabetic peripheral angiopathy without gangrene: Secondary | ICD-10-CM | POA: Diagnosis not present

## 2017-08-15 DIAGNOSIS — I350 Nonrheumatic aortic (valve) stenosis: Secondary | ICD-10-CM | POA: Diagnosis not present

## 2017-08-15 DIAGNOSIS — Z6827 Body mass index (BMI) 27.0-27.9, adult: Secondary | ICD-10-CM | POA: Diagnosis not present

## 2017-08-15 DIAGNOSIS — M25562 Pain in left knee: Secondary | ICD-10-CM | POA: Diagnosis not present

## 2017-08-15 DIAGNOSIS — I10 Essential (primary) hypertension: Secondary | ICD-10-CM | POA: Diagnosis not present

## 2017-08-16 ENCOUNTER — Other Ambulatory Visit: Payer: Self-pay | Admitting: Internal Medicine

## 2017-08-16 DIAGNOSIS — M25562 Pain in left knee: Secondary | ICD-10-CM

## 2017-08-29 DIAGNOSIS — I10 Essential (primary) hypertension: Secondary | ICD-10-CM | POA: Diagnosis not present

## 2017-08-29 DIAGNOSIS — Z6829 Body mass index (BMI) 29.0-29.9, adult: Secondary | ICD-10-CM | POA: Diagnosis not present

## 2017-08-29 DIAGNOSIS — M25562 Pain in left knee: Secondary | ICD-10-CM | POA: Diagnosis not present

## 2017-08-29 DIAGNOSIS — I872 Venous insufficiency (chronic) (peripheral): Secondary | ICD-10-CM | POA: Diagnosis not present

## 2017-08-29 DIAGNOSIS — E1151 Type 2 diabetes mellitus with diabetic peripheral angiopathy without gangrene: Secondary | ICD-10-CM | POA: Diagnosis not present

## 2017-08-29 DIAGNOSIS — I350 Nonrheumatic aortic (valve) stenosis: Secondary | ICD-10-CM | POA: Diagnosis not present

## 2017-08-29 DIAGNOSIS — R0781 Pleurodynia: Secondary | ICD-10-CM | POA: Diagnosis not present

## 2017-08-29 DIAGNOSIS — R2681 Unsteadiness on feet: Secondary | ICD-10-CM | POA: Diagnosis not present

## 2017-08-29 DIAGNOSIS — I7389 Other specified peripheral vascular diseases: Secondary | ICD-10-CM | POA: Diagnosis not present

## 2017-09-01 ENCOUNTER — Telehealth: Payer: Self-pay | Admitting: Physician Assistant

## 2017-09-01 NOTE — Telephone Encounter (Signed)
Patient daughter called, his meds have been adjusted and his blood pressure still on the low side, what to do?  His diastolic blood pressure got down to 50, systolic blood pressures 81-771H. His Entresto was cut back to 1 tablet daily.  He developed lower extremity edema, was started on Lasix 20 mg a day.  He is very weak, is unsteady on his feet and has fallen.  There may also be some other issues going on, he was started on tramadol and they are in the process of stopping that.  It is unclear if his blood pressure is contributing to his poor balance. It is unknown if he is orthostatic.  Requested that she decrease the Toprol-XL to 25 mg at bedtime only. For now, continue the [place] once daily.  He still has significant lower extremity edema, continue the Lasix 20 mg a day and Aldactone 12.5 mg a day  I will route this to the office staff, hopefully he can get an appointment within the next week or 2.  Contact us if his condition worsens.  Rosaria Ferries, PA-C 09/01/2017 7:04 PM Beeper [phone]

## 2017-09-02 NOTE — Telephone Encounter (Signed)
[name]  

## 2017-09-03 ENCOUNTER — Emergency Department (HOSPITAL_COMMUNITY): Payer: Medicare Other

## 2017-09-03 ENCOUNTER — Inpatient Hospital Stay (HOSPITAL_COMMUNITY)
Admission: EM | Admit: 2017-09-03 | Discharge: 2017-09-05 | DRG: 065 | Disposition: A | Discharge status: Home Health | Payer: Medicare Other | Attending: Internal Medicine | Admitting: Internal Medicine

## 2017-09-03 ENCOUNTER — Encounter (HOSPITAL_COMMUNITY): Payer: Self-pay | Admitting: Nurse Practitioner

## 2017-09-03 DIAGNOSIS — R260 Ataxic gait: Secondary | ICD-10-CM | POA: Diagnosis not present

## 2017-09-03 DIAGNOSIS — R4020 Unspecified coma: Secondary | ICD-10-CM | POA: Diagnosis not present

## 2017-09-03 DIAGNOSIS — G464 Cerebellar stroke syndrome: Secondary | ICD-10-CM | POA: Diagnosis not present

## 2017-09-03 DIAGNOSIS — I130 Hypertensive heart and chronic kidney disease with heart failure and stage 1 through stage 4 chronic kidney disease, or unspecified chronic kidney disease: Secondary | ICD-10-CM | POA: Diagnosis present

## 2017-09-03 DIAGNOSIS — R4182 Altered mental status, unspecified: Secondary | ICD-10-CM | POA: Diagnosis not present

## 2017-09-03 DIAGNOSIS — R410 Disorientation, unspecified: Secondary | ICD-10-CM | POA: Diagnosis present

## 2017-09-03 DIAGNOSIS — Z8249 Family history of ischemic heart disease and other diseases of the circulatory system: Secondary | ICD-10-CM

## 2017-09-03 DIAGNOSIS — F4310 Post-traumatic stress disorder, unspecified: Secondary | ICD-10-CM | POA: Diagnosis present

## 2017-09-03 DIAGNOSIS — I5022 Chronic systolic (congestive) heart failure: Secondary | ICD-10-CM | POA: Diagnosis present

## 2017-09-03 DIAGNOSIS — I10 Essential (primary) hypertension: Secondary | ICD-10-CM | POA: Diagnosis present

## 2017-09-03 DIAGNOSIS — Z888 Allergy status to other drugs, medicaments and biological substances status: Secondary | ICD-10-CM

## 2017-09-03 DIAGNOSIS — Z87891 Personal history of nicotine dependence: Secondary | ICD-10-CM

## 2017-09-03 DIAGNOSIS — I6381 Other cerebral infarction due to occlusion or stenosis of small artery: Principal | ICD-10-CM | POA: Diagnosis present

## 2017-09-03 DIAGNOSIS — G919 Hydrocephalus, unspecified: Secondary | ICD-10-CM | POA: Diagnosis not present

## 2017-09-03 DIAGNOSIS — N183 Chronic kidney disease, stage 3 (moderate): Secondary | ICD-10-CM | POA: Diagnosis present

## 2017-09-03 DIAGNOSIS — I420 Dilated cardiomyopathy: Secondary | ICD-10-CM | POA: Diagnosis present

## 2017-09-03 DIAGNOSIS — E1142 Type 2 diabetes mellitus with diabetic polyneuropathy: Secondary | ICD-10-CM | POA: Diagnosis present

## 2017-09-03 DIAGNOSIS — M353 Polymyalgia rheumatica: Secondary | ICD-10-CM | POA: Diagnosis present

## 2017-09-03 DIAGNOSIS — E538 Deficiency of other specified B group vitamins: Secondary | ICD-10-CM | POA: Diagnosis present

## 2017-09-03 DIAGNOSIS — Z7952 Long term (current) use of systemic steroids: Secondary | ICD-10-CM

## 2017-09-03 DIAGNOSIS — I6329 Cerebral infarction due to unspecified occlusion or stenosis of other precerebral arteries: Secondary | ICD-10-CM | POA: Diagnosis present

## 2017-09-03 DIAGNOSIS — E785 Hyperlipidemia, unspecified: Secondary | ICD-10-CM | POA: Diagnosis present

## 2017-09-03 DIAGNOSIS — Z79899 Other long term (current) drug therapy: Secondary | ICD-10-CM

## 2017-09-03 DIAGNOSIS — R296 Repeated falls: Secondary | ICD-10-CM | POA: Diagnosis present

## 2017-09-03 DIAGNOSIS — K219 Gastro-esophageal reflux disease without esophagitis: Secondary | ICD-10-CM | POA: Diagnosis present

## 2017-09-03 DIAGNOSIS — Z881 Allergy status to other antibiotic agents status: Secondary | ICD-10-CM

## 2017-09-03 DIAGNOSIS — E119 Type 2 diabetes mellitus without complications: Secondary | ICD-10-CM

## 2017-09-03 DIAGNOSIS — E1122 Type 2 diabetes mellitus with diabetic chronic kidney disease: Secondary | ICD-10-CM | POA: Diagnosis present

## 2017-09-03 DIAGNOSIS — E1151 Type 2 diabetes mellitus with diabetic peripheral angiopathy without gangrene: Secondary | ICD-10-CM | POA: Diagnosis present

## 2017-09-03 DIAGNOSIS — I672 Cerebral atherosclerosis: Secondary | ICD-10-CM | POA: Diagnosis present

## 2017-09-03 DIAGNOSIS — D569 Thalassemia, unspecified: Secondary | ICD-10-CM | POA: Diagnosis present

## 2017-09-03 DIAGNOSIS — I639 Cerebral infarction, unspecified: Secondary | ICD-10-CM

## 2017-09-03 DIAGNOSIS — R29700 NIHSS score 0: Secondary | ICD-10-CM | POA: Diagnosis present

## 2017-09-03 DIAGNOSIS — J811 Chronic pulmonary edema: Secondary | ICD-10-CM | POA: Diagnosis not present

## 2017-09-03 DIAGNOSIS — Z833 Family history of diabetes mellitus: Secondary | ICD-10-CM

## 2017-09-03 DIAGNOSIS — Z7984 Long term (current) use of oral hypoglycemic drugs: Secondary | ICD-10-CM

## 2017-09-03 LAB — COMPREHENSIVE METABOLIC PANEL
ALBUMIN: 3.4 g/dL — AB (ref 3.5–5.0)
ALT: 16 U/L — ABNORMAL LOW (ref 17–63)
AST: 16 U/L (ref 15–41)
Alkaline Phosphatase: 100 U/L (ref 38–126)
Anion gap: 9 (ref 5–15)
BUN: 43 mg/dL — AB (ref 6–20)
CHLORIDE: 103 mmol/L (ref 101–111)
CO2: 27 mmol/L (ref 22–32)
Calcium: 8.2 mg/dL — ABNORMAL LOW (ref 8.9–10.3)
Creatinine, Ser: 1.33 mg/dL — ABNORMAL HIGH (ref 0.61–1.24)
GFR calc Af Amer: 51 mL/min — ABNORMAL LOW (ref >60)
GFR, EST NON AFRICAN AMERICAN: 44 mL/min — AB (ref >60)
GLUCOSE: 207 mg/dL — AB (ref 65–99)
POTASSIUM: 4.7 mmol/L (ref 3.5–5.1)
SODIUM: 139 mmol/L (ref 135–145)
Total Bilirubin: 0.8 mg/dL (ref 0.3–1.2)
Total Protein: 5.9 g/dL — ABNORMAL LOW (ref 6.5–8.1)

## 2017-09-03 LAB — URINALYSIS, ROUTINE W REFLEX MICROSCOPIC
Bilirubin Urine: NEGATIVE
GLUCOSE, UA: NEGATIVE mg/dL
Hgb urine dipstick: NEGATIVE
Ketones, ur: NEGATIVE mg/dL
LEUKOCYTES UA: NEGATIVE
Nitrite: NEGATIVE
PH: 5 (ref 5.0–8.0)
Protein, ur: NEGATIVE mg/dL
Specific Gravity, Urine: 1.013 (ref 1.005–1.030)

## 2017-09-03 LAB — CBC WITH DIFFERENTIAL/PLATELET
BASOS ABS: 0 10*3/uL (ref 0.0–0.1)
Basophils Relative: 0 %
Eosinophils Absolute: 0.1 10*3/uL (ref 0.0–0.7)
Eosinophils Relative: 2 %
HEMATOCRIT: 41.7 % (ref 39.0–52.0)
Hemoglobin: 13.7 g/dL (ref 13.0–17.0)
LYMPHS PCT: 10 %
Lymphs Abs: 0.6 10*3/uL — ABNORMAL LOW (ref 0.7–4.0)
MCH: 27.2 pg (ref 26.0–34.0)
MCHC: 32.9 g/dL (ref 30.0–36.0)
MCV: 82.7 fL (ref 78.0–100.0)
Monocytes Absolute: 0.4 10*3/uL (ref 0.1–1.0)
Monocytes Relative: 7 %
NEUTROS ABS: 4.5 10*3/uL (ref 1.7–7.7)
Neutrophils Relative %: 81 %
Platelets: 187 10*3/uL (ref 150–400)
RBC: 5.04 MIL/uL (ref 4.22–5.81)
RDW: 16.5 % — ABNORMAL HIGH (ref 11.5–15.5)
WBC: 5.6 10*3/uL (ref 4.0–10.5)

## 2017-09-03 LAB — I-STAT TROPONIN, ED: Troponin i, poc: 0.02 ng/mL (ref 0.00–0.08)

## 2017-09-03 LAB — LIPASE, BLOOD: LIPASE: 32 U/L (ref 11–51)

## 2017-09-03 MED ORDER — SODIUM CHLORIDE 0.9 % IV SOLN
INTRAVENOUS | Status: DC
  Administered 2017-09-03 – 2017-09-04 (×2): via INTRAVENOUS

## 2017-09-03 MED ORDER — SODIUM CHLORIDE 0.9 % IV BOLUS
250.0000 mL | Freq: Once | INTRAVENOUS | Status: AC
  Administered 2017-09-03: 250 mL via INTRAVENOUS

## 2017-09-03 NOTE — ED Triage Notes (Signed)
Pt is presented by family who express concern over frequent fall, change in mental status all that they suspect may be medication related.

## 2017-09-03 NOTE — Telephone Encounter (Signed)
Thanks for the info. Should see Dr. Stanford Breed as he has not been seen by him in several months.

## 2017-09-04 ENCOUNTER — Inpatient Hospital Stay (HOSPITAL_COMMUNITY): Payer: Medicare Other

## 2017-09-04 ENCOUNTER — Other Ambulatory Visit: Payer: Self-pay

## 2017-09-04 DIAGNOSIS — Z7952 Long term (current) use of systemic steroids: Secondary | ICD-10-CM | POA: Diagnosis not present

## 2017-09-04 DIAGNOSIS — M353 Polymyalgia rheumatica: Secondary | ICD-10-CM | POA: Diagnosis present

## 2017-09-04 DIAGNOSIS — Z79899 Other long term (current) drug therapy: Secondary | ICD-10-CM | POA: Diagnosis not present

## 2017-09-04 DIAGNOSIS — F4310 Post-traumatic stress disorder, unspecified: Secondary | ICD-10-CM | POA: Diagnosis present

## 2017-09-04 DIAGNOSIS — I6329 Cerebral infarction due to unspecified occlusion or stenosis of other precerebral arteries: Secondary | ICD-10-CM

## 2017-09-04 DIAGNOSIS — R410 Disorientation, unspecified: Secondary | ICD-10-CM | POA: Diagnosis not present

## 2017-09-04 DIAGNOSIS — I10 Essential (primary) hypertension: Secondary | ICD-10-CM | POA: Diagnosis not present

## 2017-09-04 DIAGNOSIS — R260 Ataxic gait: Secondary | ICD-10-CM | POA: Diagnosis not present

## 2017-09-04 DIAGNOSIS — E785 Hyperlipidemia, unspecified: Secondary | ICD-10-CM | POA: Diagnosis present

## 2017-09-04 DIAGNOSIS — E1142 Type 2 diabetes mellitus with diabetic polyneuropathy: Secondary | ICD-10-CM | POA: Diagnosis present

## 2017-09-04 DIAGNOSIS — G919 Hydrocephalus, unspecified: Secondary | ICD-10-CM | POA: Diagnosis present

## 2017-09-04 DIAGNOSIS — K219 Gastro-esophageal reflux disease without esophagitis: Secondary | ICD-10-CM | POA: Diagnosis present

## 2017-09-04 DIAGNOSIS — I5022 Chronic systolic (congestive) heart failure: Secondary | ICD-10-CM | POA: Diagnosis present

## 2017-09-04 DIAGNOSIS — D569 Thalassemia, unspecified: Secondary | ICD-10-CM | POA: Diagnosis present

## 2017-09-04 DIAGNOSIS — E538 Deficiency of other specified B group vitamins: Secondary | ICD-10-CM | POA: Diagnosis present

## 2017-09-04 DIAGNOSIS — E1122 Type 2 diabetes mellitus with diabetic chronic kidney disease: Secondary | ICD-10-CM | POA: Diagnosis present

## 2017-09-04 DIAGNOSIS — R4182 Altered mental status, unspecified: Secondary | ICD-10-CM | POA: Diagnosis not present

## 2017-09-04 DIAGNOSIS — E1151 Type 2 diabetes mellitus with diabetic peripheral angiopathy without gangrene: Secondary | ICD-10-CM | POA: Diagnosis present

## 2017-09-04 DIAGNOSIS — I6381 Other cerebral infarction due to occlusion or stenosis of small artery: Secondary | ICD-10-CM | POA: Diagnosis present

## 2017-09-04 DIAGNOSIS — I672 Cerebral atherosclerosis: Secondary | ICD-10-CM | POA: Diagnosis present

## 2017-09-04 DIAGNOSIS — E119 Type 2 diabetes mellitus without complications: Secondary | ICD-10-CM | POA: Diagnosis not present

## 2017-09-04 DIAGNOSIS — I639 Cerebral infarction, unspecified: Secondary | ICD-10-CM | POA: Diagnosis not present

## 2017-09-04 DIAGNOSIS — I361 Nonrheumatic tricuspid (valve) insufficiency: Secondary | ICD-10-CM | POA: Diagnosis not present

## 2017-09-04 DIAGNOSIS — R296 Repeated falls: Secondary | ICD-10-CM | POA: Diagnosis present

## 2017-09-04 DIAGNOSIS — R29700 NIHSS score 0: Secondary | ICD-10-CM | POA: Diagnosis present

## 2017-09-04 DIAGNOSIS — Z87891 Personal history of nicotine dependence: Secondary | ICD-10-CM | POA: Diagnosis not present

## 2017-09-04 DIAGNOSIS — Z7984 Long term (current) use of oral hypoglycemic drugs: Secondary | ICD-10-CM | POA: Diagnosis not present

## 2017-09-04 DIAGNOSIS — N183 Chronic kidney disease, stage 3 (moderate): Secondary | ICD-10-CM | POA: Diagnosis present

## 2017-09-04 DIAGNOSIS — I420 Dilated cardiomyopathy: Secondary | ICD-10-CM | POA: Diagnosis not present

## 2017-09-04 DIAGNOSIS — I130 Hypertensive heart and chronic kidney disease with heart failure and stage 1 through stage 4 chronic kidney disease, or unspecified chronic kidney disease: Secondary | ICD-10-CM | POA: Diagnosis present

## 2017-09-04 LAB — LIPID PANEL
CHOL/HDL RATIO: 4.6 ratio
Cholesterol: 221 mg/dL — ABNORMAL HIGH (ref 0–200)
HDL: 48 mg/dL (ref >40)
LDL CALC: 143 mg/dL — AB (ref 0–99)
Triglycerides: 152 mg/dL — ABNORMAL HIGH (ref <150)
VLDL: 30 mg/dL (ref 0–40)

## 2017-09-04 LAB — GLUCOSE, CAPILLARY
GLUCOSE-CAPILLARY: 148 mg/dL — AB (ref 65–99)
Glucose-Capillary: 212 mg/dL — ABNORMAL HIGH (ref 65–99)
Glucose-Capillary: 184 mg/dL — ABNORMAL HIGH (ref 65–99)
Glucose-Capillary: 176 mg/dL — ABNORMAL HIGH (ref 65–99)
Glucose-Capillary: 232 mg/dL — ABNORMAL HIGH (ref 65–99)

## 2017-09-04 LAB — TROPONIN I: TROPONIN I: 0.04 ng/mL — AB (ref <0.03)

## 2017-09-04 LAB — FOLATE: Folate: 10.1 ng/mL (ref >5.9)

## 2017-09-04 LAB — HEMOGLOBIN A1C
Hgb A1c MFr Bld: 7.7 % — ABNORMAL HIGH (ref 4.8–5.6)
Mean Plasma Glucose: 174.29 mg/dL

## 2017-09-04 LAB — VITAMIN B12: VITAMIN B 12: 137 pg/mL — AB (ref 180–914)

## 2017-09-04 LAB — TSH: TSH: 1.732 u[IU]/mL (ref 0.350–4.500)

## 2017-09-04 MED ORDER — STROKE: EARLY STAGES OF RECOVERY BOOK
Freq: Once | Status: AC
  Administered 2017-09-04: 03:00:00
  Filled 2017-09-04: qty 1

## 2017-09-04 MED ORDER — ENOXAPARIN SODIUM 40 MG/0.4ML ~~LOC~~ SOLN: 40.0000 mg | Freq: Every day | SUBCUTANEOUS | Status: DC

## 2017-09-04 MED ORDER — LORAZEPAM 0.5 MG PO TABS
0.5000 mg | ORAL_TABLET | Freq: Two times a day (BID) | ORAL | Status: DC
  Administered 2017-09-04 – 2017-09-05 (×3): 0.5 mg via ORAL
  Filled 2017-09-04 (×3): qty 1

## 2017-09-04 MED ORDER — SPIRONOLACTONE 12.5 MG HALF TABLET: 12.5000 mg | ORAL_TABLET | Freq: Every day | ORAL | Status: DC

## 2017-09-04 MED ORDER — INSULIN ASPART 100 UNIT/ML ~~LOC~~ SOLN
0.0000 [IU] | Freq: Three times a day (TID) | SUBCUTANEOUS | Status: DC
  Administered 2017-09-04: 2 [IU] via SUBCUTANEOUS
  Administered 2017-09-04 – 2017-09-05 (×4): 3 [IU] via SUBCUTANEOUS

## 2017-09-04 MED ORDER — ASPIRIN 300 MG RE SUPP
300.0000 mg | Freq: Every day | RECTAL | Status: DC
  Filled 2017-09-04 (×2): qty 1

## 2017-09-04 MED ORDER — PANTOPRAZOLE SODIUM 40 MG PO TBEC
40.0000 mg | DELAYED_RELEASE_TABLET | Freq: Every day | ORAL | Status: DC
  Administered 2017-09-04 – 2017-09-05 (×2): 40 mg via ORAL
  Filled 2017-09-04 (×2): qty 1

## 2017-09-04 MED ORDER — ASPIRIN 325 MG PO TABS
325.0000 mg | ORAL_TABLET | Freq: Every day | ORAL | Status: DC
  Administered 2017-09-04 – 2017-09-05 (×2): 325 mg via ORAL
  Filled 2017-09-04 (×2): qty 1

## 2017-09-04 MED ORDER — ACETAMINOPHEN 650 MG RE SUPP: 650.0000 mg | RECTAL | Status: DC | PRN

## 2017-09-04 MED ORDER — CLOPIDOGREL BISULFATE 75 MG PO TABS
75.0000 mg | ORAL_TABLET | Freq: Every day | ORAL | Status: DC
  Administered 2017-09-04: 75 mg via ORAL
  Filled 2017-09-04 (×2): qty 1

## 2017-09-04 MED ORDER — METOPROLOL SUCCINATE ER 25 MG PO TB24: 25.0000 mg | ORAL_TABLET | Freq: Every day | ORAL | Status: DC

## 2017-09-04 MED ORDER — ESCITALOPRAM OXALATE 10 MG PO TABS
20.0000 mg | ORAL_TABLET | Freq: Every day | ORAL | Status: DC
  Administered 2017-09-04 – 2017-09-05 (×2): 20 mg via ORAL
  Filled 2017-09-04 (×2): qty 2

## 2017-09-04 MED ORDER — METOPROLOL SUCCINATE ER 25 MG PO TB24
25.0000 mg | ORAL_TABLET | Freq: Every day | ORAL | Status: DC
  Administered 2017-09-04: 25 mg via ORAL
  Filled 2017-09-04: qty 1

## 2017-09-04 MED ORDER — SACUBITRIL-VALSARTAN 24-26 MG PO TABS
1.0000 | ORAL_TABLET | Freq: Every day | ORAL | Status: DC
  Administered 2017-09-04 – 2017-09-05 (×2): 1 via ORAL
  Filled 2017-09-04 (×2): qty 1

## 2017-09-04 MED ORDER — ACETAMINOPHEN 325 MG PO TABS: 650.0000 mg | ORAL_TABLET | ORAL | Status: DC | PRN

## 2017-09-04 MED ORDER — ENOXAPARIN SODIUM 30 MG/0.3ML ~~LOC~~ SOLN
30.0000 mg | Freq: Every day | SUBCUTANEOUS | Status: DC
  Administered 2017-09-04 – 2017-09-05 (×2): 30 mg via SUBCUTANEOUS
  Filled 2017-09-04 (×2): qty 0.3

## 2017-09-04 MED ORDER — PREDNISONE 5 MG PO TABS
5.0000 mg | ORAL_TABLET | Freq: Every day | ORAL | Status: DC
  Administered 2017-09-04 – 2017-09-05 (×2): 5 mg via ORAL
  Filled 2017-09-04 (×2): qty 1

## 2017-09-04 MED ORDER — ACETAMINOPHEN 160 MG/5ML PO SOLN: 650.0000 mg | ORAL | Status: DC | PRN

## 2017-09-04 NOTE — Consult Note (Addendum)
Requesting Physician: Dr. Horris Latino, Liliana Cline    Chief Complaint: Gait instability and subacute strokes on MRI  History obtained from:  Patient     HPI:                                                                                                                                         Of note patient is a moderately poor historian.  He tends to perseverate on his weakness and how he used to be strong.  Has a difficult time recalling just how long he was having issues.  Jayel Inks is an 84 y.o. male with hypertension, hyperlipidemia and diabetes.  Last echo on 08/09/2017 showed an EF of 20% diffuse hypokinesis.  Recent carotid Doppler showed 1 to 39% stenosis.  MRI shows multiple small acute cerebellar infarcts in different distributions.  He also has hydrocephalus ex vacuo along with significant atrophy of his brain.  Patient states that over the past year has been having difficulty walking and had multiple falls.  He states that he does shuffle his steps needs to use his arms to get up from a chair.  He feels off balance when he walks.  He states that he used to be significantly active however the past couple years his activity has diminished significantly secondary to imbalance and gait instability.  At this time he uses his sister's wheelchair to get around in the house.  And he feels weak in bilateral legs.  He knows he has peripheral neuropathy but feels that his A1c has been on target at 8.0--which is consistent with recent labs to which his A1c has been 7.7 down to 6.1.    Allergy was consulted secondary to multiple falls along with multiple acute strokes in the cerebellum  Does not drive, his son does his bills.  Gets around in his house with a wheelchair but states he is able to walk.  Not feel he has any change in his memory however had a difficult time recalling 3 objects however cannot do a Mini-Mental Status exam on the hospital  Date last known well: Unable to determine Time  last known well: Unable to determine tPA Given: No: Out of window NIH stroke scale 0 Modified Rankin: Rankin Score=4    Past Medical History:  Diagnosis Date  . Anxiety   . Aortic stenosis   . Benign neoplasm of colon   . Cancer ([place])    left cheek  . Diabetes mellitus   . Diverticulosis of colon (without mention of hemorrhage) 02/03/2005  . Hemorrhoids 02/03/2005   Internal and external  . History of Lyme disease   . HTN (hypertension)   . Hyperlipemia   . Personal history of colonic polyps   . Phlebitis 06-24-2014   left leg  . Polymyalgia rheumatica ([place])   . PTSD (post-traumatic stress disorder)   . Spinal stenosis   . Thalassemia   .  Varicose veins     Past Surgical History:  Procedure Laterality Date  . CATARACT EXTRACTION, BILATERAL    . HEMORROIDECTOMY    . KIDNEY STONE SURGERY      Family History  Problem Relation Age of Onset  . Diabetes Father   . Leukemia Mother   . Hypertension Mother   . Arthritis Mother   . Diabetes Sister          Social History:  reports that he has quit smoking. His smoking use included cigars. He quit after 78.00 years of use. He has never used smokeless tobacco. He reports that he does not drink alcohol or use drugs.  Allergies:  Allergies  Allergen Reactions  . Cephalosporins Hives and Itching  . Lamisil Af Defense [Tolnaftate]     Severe hives    Medications:                                                                                                                           Prior to Admission:  Medications Prior to Admission  Medication Sig Dispense Refill Last Dose  . Coenzyme Q10 (CO Q-10 PO) Take 1 tablet by mouth daily.   unknown  . escitalopram (LEXAPRO) 20 MG tablet Take 20 mg by mouth daily.   09/03/2017 at Unknown time  . glipiZIDE (GLUCOTROL) 5 MG tablet Take 5 mg by mouth as needed.    09/03/2017 at Unknown time  . LORazepam (ATIVAN) 0.5 MG tablet Take 0.5 mg by mouth 2 (two) times daily.   09/03/2017  at Unknown time  . Menaquinone-7 (VITAMIN K2 PO) Take 300 mg by mouth daily.   unknown  . metFORMIN (GLUCOPHAGE) 500 MG tablet Take 250 mg by mouth daily. Pt takes half of a 500mg  tablet each morning.   09/03/2017 at Unknown time  . metoprolol succinate (TOPROL-XL) 25 MG 24 hr tablet Take 1 tablet (25 mg total) by mouth 2 (two) times daily. (Patient taking differently: Take 25 mg by mouth daily. ) 60 tablet 5 09/03/2017 at Unknown time  . multivitamin-lutein (OCUVITE-LUTEIN) CAPS capsule Take 2 capsules by mouth daily.    unknown  . pantoprazole (PROTONIX) 40 MG tablet Take 1 tablet by mouth every morning. (Patient taking differently: Take 40 mg by mouth daily. Take 1 tablet by mouth every morning.) 30 tablet 11 09/03/2017 at Unknown time  . predniSONE (DELTASONE) 5 MG tablet Take 5 mg by mouth daily with breakfast.   09/03/2017 at Unknown time  . sacubitril-valsartan (ENTRESTO) 24-26 MG Take 1 tablet by mouth 2 (two) times daily. (Patient taking differently: Take 1 tablet by mouth daily. ) 60 tablet 2 09/03/2017 at Unknown time  . spironolactone (ALDACTONE) 25 MG tablet Take 0.5 tablets (12.5 mg total) by mouth daily. 15 tablet 3 09/03/2017 at Unknown time   Scheduled: . aspirin  300 mg Rectal Daily   Or  . aspirin  325 mg Oral Daily  . enoxaparin (LOVENOX) injection  30 mg Subcutaneous  Daily  . escitalopram  20 mg Oral Daily  . insulin aspart  0-15 Units Subcutaneous TID WC  . LORazepam  0.5 mg Oral BID  . metoprolol succinate  25 mg Oral QHS  . pantoprazole  40 mg Oral Daily  . predniSONE  5 mg Oral Q breakfast  . sacubitril-valsartan  1 tablet Oral Daily    ROS:                                                                                                                                       History obtained from the patient  General ROS: negative for - chills, fatigue, fever, night sweats, weight gain or weight loss Psychological ROS: negative for - , hallucinations, memory difficulties,  mood swings or  Ophthalmic ROS: negative for - blurry vision, double vision, eye pain or loss of vision ENT ROS: negative for - epistaxis, nasal discharge, oral lesions, sore throat, tinnitus or vertigo Respiratory ROS: negative for - cough,  shortness of breath or wheezing Cardiovascular ROS: negative for - chest pain, dyspnea on exertion,  Gastrointestinal ROS: negative for - abdominal pain, diarrhea,  nausea/vomiting or stool incontinence Genito-Urinary ROS: negative for - dysuria, hematuria, incontinence or urinary frequency/urgency Musculoskeletal ROS: Positive for - joint swelling or muscular weakness Neurological ROS: as noted in HPI   General Examination:                                                                                                      Blood pressure 127/82, pulse (!) 59, temperature 98.5 F (36.9 C), temperature source Oral, resp. rate 18, height 5\' 4"  (1.626 m), weight 68.5 kg (151 lb 1.6 oz), SpO2 98 %.  HEENT-  Normocephalic, no lesions, without obvious abnormality.  Normal external eye and conjunctiva.   Extremities- Warm, dry and intact Musculoskeletal-positive joint tenderness, deformity or swelling Skin-warm and dry, no hyperpigmentation, vitiligo, or suspicious lesions  Neurological Examination Mental Status: Alert, oriented, thought content appropriate.  Speech fluent without evidence of aphasia.  Able to follow 3 step commands without difficulty. Cranial Nerves: II:  Visual fields grossly normal,  III,IV, VI: ptosis not present, extra-ocular motions intact bilaterally, pupils equal, round, reactive to light and accommodation V,VII: smile symmetric, facial light touch sensation normal bilaterally VIII: hearing normal bilaterally IX,X: uvula rises symmetrically XI: bilateral shoulder shrug XII: midline tongue extension Motor: Right : Upper extremity   5/5    Left:     Upper extremity   5/5  Lower extremity   5/5     Lower extremity   5/5 --No  cogwheeling or spasticity noted in his upper or lower extremities Tone and bulk:normal tone throughout; no atrophy noted Sensory: Patient has significant neuropathy in his lower extremities.  On his right leg he could feel cold sensation about just below the knee on his left leg he started to feel cold sensation just above his knee.  He had no proprioception of his toes on either side.  He could not feel vibratory sensation until I reached his knees. Deep Tendon Reflexes: 1+ throughout upper extremities no knee jerk or ankle jerk Plantars: Equivocal Cerebellar: normal finger-to-nose, difficulty doing heel-to-shin secondary to strength Gait: Not tested   Lab Results: Basic Metabolic Panel: Recent Labs  Lab 09/03/17 2025  NA 139  K 4.7  CL 103  CO2 27  GLUCOSE 207*  BUN 43*  CREATININE 1.33*  CALCIUM 8.2*    CBC: Recent Labs  Lab 09/03/17 2025  WBC 5.6  NEUTROABS 4.5  HGB 13.7  HCT 41.7  MCV 82.7  PLT 187    Lipid Panel: Recent Labs  Lab 09/04/17 0406  CHOL 221*  TRIG 152*  HDL 48  CHOLHDL 4.6  VLDL 30  LDLCALC 143*    CBG: Recent Labs  Lab 09/04/17 0226 09/04/17 0741 09/04/17 1129  GLUCAP 212* 148* 184*    Imaging: Dg Chest 2 View  Result Date: 09/03/2017 CLINICAL DATA:  84 y/o  M; frequent falls, change in mental status. EXAM: CHEST - 2 VIEW COMPARISON:  09/11/2014 chest radiograph. FINDINGS: Stable cardiac silhouette given projection and technique. Aortic atherosclerosis with calcification. Pulmonary venous hypertension and mild reticular opacities of the lungs. No consolidation. No pleural effusion or pneumothorax. No acute osseous abnormality is evident. IMPRESSION: Pulmonary venous hypertension and mild interstitial edema. No focal consolidation. Aortic atherosclerosis. Electronically Signed   By: Kristine Garbe M.D.   On: 09/03/2017 21:05   Ct Head Wo Contrast  Result Date: 09/03/2017 CLINICAL DATA:  Altered LOC, frequent falling EXAM:  CT HEAD WITHOUT CONTRAST TECHNIQUE: Contiguous axial images were obtained from the base of the skull through the vertex without intravenous contrast. COMPARISON:  None. FINDINGS: Brain: No hemorrhage or intracranial mass is visualized. Age indeterminate hypodensity in the left cerebellum. Marked atrophy. Mild small vessel ischemic changes of the white matter. Slightly prominent ventricles, felt secondary to atrophy. Vascular: No hyperdense vessels.  Carotid vascular calcification. Skull: No fracture. Sinuses/Orbits: No acute finding. Other: None IMPRESSION: 1. Age indeterminate hypodensity/lacunar infarct in the left cerebellum. 2. Atrophy and mild small vessel ischemic changes of the white matter. Electronically Signed   By: Donavan Foil M.D.   On: 09/03/2017 21:27   Mr Brain Wo Contrast  Result Date: 09/04/2017 CLINICAL DATA:  Ataxia, stroke suspected. Frequent falls and loss of balance over the past 2 weeks. Congestive heart failure with dilated cardiomyopathy. Hypertension. EXAM: MRI HEAD WITHOUT CONTRAST MRA HEAD WITHOUT CONTRAST TECHNIQUE: Multiplanar, multiecho pulse sequences of the brain and surrounding structures were obtained without intravenous contrast. Angiographic images of the head were obtained using MRA technique without contrast. COMPARISON:  CT head 09/03/2017. FINDINGS: MRI HEAD FINDINGS Brain: In the LEFT inferior cerebellum, affecting cortex and white matter, is a slightly greater than 1 cm area of restricted diffusion, corresponding low ADC, correlating with the CT abnormality, consistent with an acute infarct. Additional smaller, subcentimeter acute infarcts are noted in the midline vermis, RIGHT occipital pole cortex, and medial RIGHT posterior parietal cortex. None  are hemorrhagic. There is no mass lesion or extra-axial fluid. Hydrocephalus ex vacuo with dilated ventricles. Advanced atrophy. T2 and FLAIR hyperintensities in the white matter consistent with small vessel disease, not  unexpected for age. Vascular: Reported separately. Skull and upper cervical spine: No tonsillar herniation. Normal marrow signal. Cervical spondylosis. Sinuses/Orbits: No sinus opacity.  BILATERAL cataract extraction. Other: None. MRA HEAD FINDINGS Mild nonstenotic irregularity of the cavernous internal carotid arteries bilaterally. Possible 50% stenosis of the RIGHT M1 MCA at its origin and LEFT A1 ACA at its origin. Mild nonstenotic irregularity of both superior and inferior RIGHT M2 MCA branches. High-grade stenosis LEFT M2 superior MCA branch. Anterior communicating artery and distal anterior cerebral arteries patent. Mild to moderate irregularity of the MCA M3 branches bilaterally. Basilar artery widely patent and gives rise to both posterior cerebral arteries which are also widely patent. Vertebral arteries are codominant without distal flow-limiting stenosis. Poor visualization of the RIGHT PICA origin. Poor visualization of the RIGHT PICA and AICA origin. IMPRESSION: Multiple small, acute, nonhemorrhagic cerebral and cerebellar infarcts. Given the different vascular territories involved, these may represent a shower of emboli. Intracranial atherosclerotic disease as described, although no vertebral, basilar, or PCA disease of significance is observed. Electronically Signed   By: Staci Righter M.D.   On: 09/04/2017 13:59   Mr Jodene Nam Head Wo Contrast  Result Date: 09/04/2017 CLINICAL DATA:  Ataxia, stroke suspected. Frequent falls and loss of balance over the past 2 weeks. Congestive heart failure with dilated cardiomyopathy. Hypertension. EXAM: MRI HEAD WITHOUT CONTRAST MRA HEAD WITHOUT CONTRAST TECHNIQUE: Multiplanar, multiecho pulse sequences of the brain and surrounding structures were obtained without intravenous contrast. Angiographic images of the head were obtained using MRA technique without contrast. COMPARISON:  CT head 09/03/2017. FINDINGS: MRI HEAD FINDINGS Brain: In the LEFT inferior cerebellum,  affecting cortex and white matter, is a slightly greater than 1 cm area of restricted diffusion, corresponding low ADC, correlating with the CT abnormality, consistent with an acute infarct. Additional smaller, subcentimeter acute infarcts are noted in the midline vermis, RIGHT occipital pole cortex, and medial RIGHT posterior parietal cortex. None are hemorrhagic. There is no mass lesion or extra-axial fluid. Hydrocephalus ex vacuo with dilated ventricles. Advanced atrophy. T2 and FLAIR hyperintensities in the white matter consistent with small vessel disease, not unexpected for age. Vascular: Reported separately. Skull and upper cervical spine: No tonsillar herniation. Normal marrow signal. Cervical spondylosis. Sinuses/Orbits: No sinus opacity.  BILATERAL cataract extraction. Other: None. MRA HEAD FINDINGS Mild nonstenotic irregularity of the cavernous internal carotid arteries bilaterally. Possible 50% stenosis of the RIGHT M1 MCA at its origin and LEFT A1 ACA at its origin. Mild nonstenotic irregularity of both superior and inferior RIGHT M2 MCA branches. High-grade stenosis LEFT M2 superior MCA branch. Anterior communicating artery and distal anterior cerebral arteries patent. Mild to moderate irregularity of the MCA M3 branches bilaterally. Basilar artery widely patent and gives rise to both posterior cerebral arteries which are also widely patent. Vertebral arteries are codominant without distal flow-limiting stenosis. Poor visualization of the RIGHT PICA origin. Poor visualization of the RIGHT PICA and AICA origin. IMPRESSION: Multiple small, acute, nonhemorrhagic cerebral and cerebellar infarcts. Given the different vascular territories involved, these may represent a shower of emboli. Intracranial atherosclerotic disease as described, although no vertebral, basilar, or PCA disease of significance is observed. Electronically Signed   By: Staci Righter M.D.   On: 09/04/2017 13:59    Assessment and plan  discussed with with attending physician and they  are in agreement.    Etta Quill PA-C [facility] [phone]  09/04/2017, 4:24 PM   Assessment: 84 y.o. male with a 1 to 2-year history of gait difficulty and multiple falls however patient is a poor historian and focuses more on his lower extremity weakness.  On exam patient has full strength however he has significant distal neuropathy including proprioception, temperature, vibration and pain up to his knees.  At this point patient has 2 aspects going on that could easily cause his gait instability.  The first being he has significant neuropathy in his lower extremities along with multiple small infarcts in the cerebellum.  Stroke Risk Factors - diabetes mellitus, hyperlipidemia and hypertension  Recommend --PT consult, OT consult, Speech consult--for both strengthening and also possible DME at home as he has been getting around in a wheelchair and if he can walk he could most likely benefit from a walker for both stability and also safet   --Patient was supposed to be seen by PT and OT today I would recommend he deftly be seen tomorrow --80 mg of Atorvistatin--LDL 143 --Prophylactic therapy ASA 325 mg +Plavix 75mg  daily x 3 weeks (possibly cardioembolic due to Afib however not confirmed at this point and likely high risk of bleeding due to falls)  --Telemetry monitoring --Frequent neuro checks  Follow up in Stroke clinic with Dr Leonie Man     NEUROHOSPITALIST ADDENDUM Seen and examined the patient today. I have reviewed the contents of history and physical exam as documented by PA/ARNP/Resident and agree with above documentation.  I have discussed and formulated the above plan as documented. Edits to the note have been made as needed.    Karena Addison Aroor MD [facility] [phone]   If 7pm to 7am, please call on call as listed on AMION.

## 2017-09-04 NOTE — Progress Notes (Addendum)
Bilateral carotid duplex completed. Technically difficult and limited due to respiratory interference, tortuosity, and high bifurcations. Bilateral 15 to 395 ICA stenosis. Unable to visualize the vertebral arteries well enough to evaluate due to respiratory interference. [facility], [place]  09/04/2017 9:36 AM

## 2017-09-04 NOTE — Evaluation (Signed)
Speech Language Pathology Evaluation Patient Details Name: Antonio Buchanan MRN: [id number] DOB: 06/03/1921 Today's Date: 09/04/2017 Time: 1093-2355 SLP Time Calculation (min) (ACUTE ONLY): 36 min  Problem List:  Patient Active Problem List   Diagnosis Date Noted  . Acute arterial ischemic stroke, vertebrobasilar, cerebellar ([place]) 09/04/2017  . Delirium 09/04/2017  . Ataxic gait 09/04/2017  . Polymyalgia rheumatica ([place]) 06/09/2016  . Spondylosis of lumbar region without myelopathy or radiculopathy 06/09/2016  . DJD (degenerative joint disease), cervical 06/09/2016  . Primary osteoarthritis of both hands 06/09/2016  . History of scoliosis 06/09/2016  . Venous insufficiency 06/09/2016  . History of thalassemia 06/09/2016  . History of trigeminal neuralgia 06/09/2016  . History of diabetes mellitus 06/09/2016  . Rash 09/08/2014  . ARF (acute renal failure) ([place]) 09/08/2014  . Venous stasis dermatitis of left lower extremity 09/08/2014  . Dehydration 09/08/2014  . Allergic reaction caused by a drug 09/08/2014  . Congestive dilated cardiomyopathy ([place]) 08/29/2014  . Chronic constipation 03/26/2012  . Aortic stenosis 03/01/2012  . Chronic venous hypertension without complications 73/22/0254  . Murmur 02/24/2011  . Tobacco abuse 02/24/2011  . Diabetes mellitus ([place])   . HTN (hypertension)   . Hyperlipemia   . Spinal stenosis   . Thalassemia   . History of Lyme disease   . PTSD (post-traumatic stress disorder)   . GERD 04/06/2009  . NAUSEA 04/06/2009  . FLATULENCE-GAS-BLOATING 04/06/2009  . ABDOMINAL PAIN-EPIGASTRIC 04/06/2009   Past Medical History:  Past Medical History:  Diagnosis Date  . Anxiety   . Aortic stenosis   . Benign neoplasm of colon   . Cancer ([place])    left cheek  . Diabetes mellitus   . Diverticulosis of colon (without mention of hemorrhage) 02/03/2005  . Hemorrhoids 02/03/2005   Internal and external  . History of Lyme disease   . HTN  (hypertension)   . Hyperlipemia   . Personal history of colonic polyps   . Phlebitis 06-24-2014   left leg  . Polymyalgia rheumatica ([place])   . PTSD (post-traumatic stress disorder)   . Spinal stenosis   . Thalassemia   . Varicose veins    Past Surgical History:  Past Surgical History:  Procedure Laterality Date  . CATARACT EXTRACTION, BILATERAL    . HEMORROIDECTOMY    . KIDNEY STONE SURGERY     HPI:  84 yo male adm to [facility] with speech deficits, AMS, recent falls x2 weeks.  Pt with h/o PTSD, dilated cardiomyopathy, HTN, PMR, lyme disease, spintal stenosis, left cheek cancer, cigarette/cigar use quit after 70 years of use.  Pt is a [employer] vet - and received 2 purple hearts - he received war disabiltiy.  Pt with subacute or acute cerebellar cva.  Speech/language evaluation ordered.  Pt passed RNSSS.    Assessment / Plan / Recommendation Clinical Impression  No aphasia or dysarthria noted.  Pt presents with orientation to person only - place and situation with cue.  After orientation to situation and place, pt was able to correctly identify later in session.  He is able to follow basic directions during assessment and no focal CN deficits apparent.  Pt states he is left handed then remarks he is ambidexterous.    At times, pt appeared to be off topic with his expression -however he is also philosophical.   Uncertain if he is covering for higher level cognitive deficits.   Family not present to establish baseline.  Will follow up for cognitive linguistic treatment/diagnostics.  SLP Assessment  SLP Recommendation/Assessment: Patient needs continued Speech Lanaguage Pathology Services SLP Visit Diagnosis: Cognitive communication deficit (R41.841)    Follow Up Recommendations  (tbd)    Frequency and Duration min 1 x/week  1 week      SLP Evaluation Cognition  Overall Cognitive Status: No family/caregiver present to determine baseline cognitive functioning Arousal/Alertness:  Awake/alert Orientation Level: Oriented to person;Disoriented to time;Disoriented to place;Disoriented to situation Attention: Sustained Sustained Attention: Impaired Memory: Appears intact Memory Impairment: Retrieval deficit Awareness: Appears intact Problem Solving: Appears intact(for basic  - eg finding call bell) Behaviors: Perseveration;Agitated Behavior Scale Safety/Judgment: Appears intact(aware of need to call for assistance)       Comprehension  Auditory Comprehension Yes/No Questions: Not tested Commands: Within Functional Limits(for simple basic commands) Visual Recognition/Discrimination Discrimination: Within Function Limits Reading Comprehension Reading Status: Within funtional limits(for signs posted at [facility] regarding his hearing loss)    Expression Expression Primary Mode of Expression: Verbal Verbal Expression Overall Verbal Expression: Appears within functional limits for tasks assessed Initiation: No impairment Repetition: (dnt) Naming: Not tested Pragmatics: No impairment Written Expression Dominant Hand: Left(pt reports he is ambidexterous)   Oral / Motor  Oral Motor/Sensory Function Overall Oral Motor/Sensory Function: Within functional limits(no focal CN deficits) Motor Speech Overall Motor Speech: Appears within functional limits for tasks assessed Respiration: Within functional limits Phonation: Normal Resonance: Within functional limits Articulation: Within functional limitis Intelligibility: Intelligible Motor Planning: Witnin functional limits Motor Speech Errors: Not applicable   GO                    Macario Golds 09/04/2017, 1:25 PM  Luanna Salk, [facility] SLP [phone]

## 2017-09-04 NOTE — ED Provider Notes (Signed)
[facility] DEPT Provider Note   CSN: [id number] Arrival date & time: 09/03/17  1907     History   Chief Complaint Chief Complaint  Patient presents with  . Fall  . Altered Mental Status    HPI Antonio Buchanan is a 84 y.o. male.  Patient is a [employer].  With service related disability.  Brought in by family over concerns over frequent falls really which is been occurring over the last 2 weeks.  There is been some intermittent mental status changes.  In discussion with patient's primary care providers they recommended ED evaluation.  Which seems appropriate.  No complaint of chest pain or trouble breathing or upper respiratory infections no complaint of headache no complaint of abdominal pain.  Does have some discomfort over his right anterior rib area which she said started hurting after fall.  Does have a bruise on his right shoulder.  Patient has been quite functional.  Over the last 2 weeks there is been some increased instability with him getting around.     Past Medical History:  Diagnosis Date  . Anxiety   . Aortic stenosis   . Benign neoplasm of colon   . Cancer ([place])    left cheek  . Diabetes mellitus   . Diverticulosis of colon (without mention of hemorrhage) 02/03/2005  . Hemorrhoids 02/03/2005   Internal and external  . History of Lyme disease   . HTN (hypertension)   . Hyperlipemia   . Personal history of colonic polyps   . Phlebitis 06-24-2014   left leg  . Polymyalgia rheumatica ([place])   . PTSD (post-traumatic stress disorder)   . Spinal stenosis   . Thalassemia   . Varicose veins     Patient Active Problem List   Diagnosis Date Noted  . Polymyalgia rheumatica ([place]) 06/09/2016  . Spondylosis of lumbar region without myelopathy or radiculopathy 06/09/2016  . DJD (degenerative joint disease), cervical 06/09/2016  . Primary osteoarthritis of both hands 06/09/2016  . History of scoliosis 06/09/2016  .  Venous insufficiency 06/09/2016  . History of thalassemia 06/09/2016  . History of trigeminal neuralgia 06/09/2016  . History of diabetes mellitus 06/09/2016  . Rash 09/08/2014  . ARF (acute renal failure) ([place]) 09/08/2014  . Venous stasis dermatitis of left lower extremity 09/08/2014  . Dehydration 09/08/2014  . Allergic reaction caused by a drug 09/08/2014  . Congestive dilated cardiomyopathy ([place]) 08/29/2014  . Chronic constipation 03/26/2012  . Aortic stenosis 03/01/2012  . Chronic venous hypertension without complications 37/12/6267  . Murmur 02/24/2011  . Tobacco abuse 02/24/2011  . Diabetes mellitus ([place])   . HTN (hypertension)   . Hyperlipemia   . Spinal stenosis   . Thalassemia   . History of Lyme disease   . PTSD (post-traumatic stress disorder)   . GERD 04/06/2009  . NAUSEA 04/06/2009  . FLATULENCE-GAS-BLOATING 04/06/2009  . ABDOMINAL PAIN-EPIGASTRIC 04/06/2009    Past Surgical History:  Procedure Laterality Date  . CATARACT EXTRACTION, BILATERAL    . HEMORROIDECTOMY    . KIDNEY STONE SURGERY          Home Medications    Prior to Admission medications   Medication Sig Start Date End Date Taking? Authorizing Provider  escitalopram (LEXAPRO) 20 MG tablet Take 20 mg by mouth daily.   Yes [provider]  sacubitril-valsartan (ENTRESTO) 24-26 MG Take 1 tablet by mouth 2 (two) times daily. Patient taking differently: Take 1 tablet by mouth daily.  08/07/17  Yes Lendon Colonel, NP  Coenzyme Q10 (CO Q-10 PO) Take 1 tablet by mouth daily.    [provider]  glipiZIDE (GLUCOTROL) 5 MG tablet Take 5 mg by mouth as needed.     [provider]  LORazepam (ATIVAN) 0.5 MG tablet Take 0.5 mg by mouth 2 (two) times daily.    [provider]  Menaquinone-7 (VITAMIN K2 PO) Take 300 mg by mouth daily.    [provider]  metFORMIN (GLUCOPHAGE) 500 MG tablet Take 250 mg by mouth daily. Pt takes half of a 500mg  tablet each  morning.    [provider]  metoprolol succinate (TOPROL-XL) 25 MG 24 hr tablet Take 1 tablet (25 mg total) by mouth 2 (two) times daily. Patient taking differently: Take 25 mg by mouth daily.  08/07/17   Lendon Colonel, NP  multivitamin-lutein [facility]) CAPS capsule Take 2 capsules by mouth daily.     [provider]  pantoprazole (PROTONIX) 40 MG tablet Take 1 tablet by mouth every morning. 06/21/17   Esterwood, Amy S, PA-C  prednisoLONE 5 MG TABS tablet Take 10 mg by mouth daily.    [provider]  spironolactone (ALDACTONE) 25 MG tablet Take 0.5 tablets (12.5 mg total) by mouth daily. 08/07/17   Lendon Colonel, NP    Family History Family History  Problem Relation Age of Onset  . Diabetes Father   . Leukemia Mother   . Hypertension Mother   . Arthritis Mother   . Diabetes Sister     Social History Social History   Tobacco Use  . Smoking status: Former Smoker    Years: 78.00    Types: Cigars  . Smokeless tobacco: Never Used  Substance Use Topics  . Alcohol use: No    Alcohol/week: 0.0 oz  . Drug use: No     Allergies   Cephalosporins and Lamisil af defense [tolnaftate]   Review of Systems Review of Systems  Constitutional: Negative for fever.  HENT: Negative for congestion.   Eyes: Positive for visual disturbance.  Respiratory: Negative for shortness of breath.   Cardiovascular: Positive for chest pain and leg swelling.  Gastrointestinal: Negative for abdominal pain, diarrhea, nausea and vomiting.  Genitourinary: Negative for dysuria.  Musculoskeletal: Negative for back pain.  Skin: Negative for rash.  Neurological: Positive for weakness. Negative for seizures, syncope, speech difficulty and headaches.  Hematological: Does not bruise/bleed easily.  Psychiatric/Behavioral: Positive for confusion.     Physical Exam Updated Vital Signs BP 120/67   Pulse 65   Temp 98.3 F (36.8 C) (Oral)   Resp 19   SpO2 96%    Physical Exam  Constitutional: He appears well-developed and well-nourished. No distress.  HENT:  Head: Normocephalic and atraumatic.  Mucous membranes slightly dry.  Eyes: Pupils are equal, round, and reactive to light. Conjunctivae and EOM are normal.  Neck: Neck supple.  Cardiovascular: Normal rate and regular rhythm.  Pulmonary/Chest: Effort normal and breath sounds normal. He exhibits tenderness.  Some mild tenderness to palpation rib margin on the right side.  No crepitance.  Abdominal: Soft. Bowel sounds are normal. There is no tenderness.  Musculoskeletal: He exhibits edema.  Bruise to the right anterior shoulder but with good range of motion.  No deformity.  Bilateral some leg edema.  Neurological: He is alert. No cranial nerve deficit or sensory deficit. He exhibits normal muscle tone.  Patient with some confusion.  But is alert and is conversive  Skin: Skin is  warm.  Nursing note and vitals reviewed.    ED Treatments / Results  Labs (all labs ordered are listed, but only abnormal results are displayed) Labs Reviewed  COMPREHENSIVE METABOLIC PANEL - Abnormal; Notable for the following components:      Result Value   Glucose, Bld 207 (*)    BUN 43 (*)    Creatinine, Ser 1.33 (*)    Calcium 8.2 (*)    Total Protein 5.9 (*)    Albumin 3.4 (*)    ALT 16 (*)    GFR calc non Af Amer 44 (*)    GFR calc Af Amer 51 (*)    All other components within normal limits  CBC WITH DIFFERENTIAL/PLATELET - Abnormal; Notable for the following components:   RDW 16.5 (*)    Lymphs Abs 0.6 (*)    All other components within normal limits  LIPASE, BLOOD  URINALYSIS, ROUTINE W REFLEX MICROSCOPIC  TROPONIN I  I-STAT TROPONIN, ED    EKG EKG Interpretation  Date/Time:  Sunday September 03 2017 21:25:47 EDT Ventricular Rate:  75 PR Interval:    QRS Duration: 111 QT Interval:  429 QTC Calculation: 480 R Axis:   -16 Text Interpretation:  Age not entered, assumed to be  84 years old  for purpose of ECG interpretation Sinus rhythm Short PR interval Borderline left axis deviation Repol abnrm suggests ischemia, diffuse leads Baseline wander in lead(s) II III aVF Confirmed by Fredia Sorrow [phone]) on 09/03/2017 9:45:42 PM   Radiology Dg Chest 2 View  Result Date: 09/03/2017 CLINICAL DATA:  84 y/o  M; frequent falls, change in mental status. EXAM: CHEST - 2 VIEW COMPARISON:  09/11/2014 chest radiograph. FINDINGS: Stable cardiac silhouette given projection and technique. Aortic atherosclerosis with calcification. Pulmonary venous hypertension and mild reticular opacities of the lungs. No consolidation. No pleural effusion or pneumothorax. No acute osseous abnormality is evident. IMPRESSION: Pulmonary venous hypertension and mild interstitial edema. No focal consolidation. Aortic atherosclerosis. Electronically Signed   By: Kristine Garbe M.D.   On: 09/03/2017 21:05   Ct Head Wo Contrast  Result Date: 09/03/2017 CLINICAL DATA:  Altered LOC, frequent falling EXAM: CT HEAD WITHOUT CONTRAST TECHNIQUE: Contiguous axial images were obtained from the base of the skull through the vertex without intravenous contrast. COMPARISON:  None. FINDINGS: Brain: No hemorrhage or intracranial mass is visualized. Age indeterminate hypodensity in the left cerebellum. Marked atrophy. Mild small vessel ischemic changes of the white matter. Slightly prominent ventricles, felt secondary to atrophy. Vascular: No hyperdense vessels.  Carotid vascular calcification. Skull: No fracture. Sinuses/Orbits: No acute finding. Other: None IMPRESSION: 1. Age indeterminate hypodensity/lacunar infarct in the left cerebellum. 2. Atrophy and mild small vessel ischemic changes of the white matter. Electronically Signed   By: Donavan Foil M.D.   On: 09/03/2017 21:27    Procedures Procedures (including critical care time)  Medications Ordered in ED Medications  0.9 %  sodium chloride infusion ( Intravenous New  Bag/Given 09/03/17 2217)  sodium chloride 0.9 % bolus 250 mL (0 mLs Intravenous Stopped 09/03/17 2144)     Initial Impression / Assessment and Plan / ED Course  I have reviewed the triage vital signs and the nursing notes.  Pertinent labs & imaging results that were available during my care of the patient were reviewed by me and considered in my medical decision making (see chart for details).     Work-up here without any significant findings chest x-ray negative head CT did show evidence of a  cerebellum infarct age undetermined.  Discussed with the neuroradiologist looked at it little more in detail and there was there opinion that this could be acute or subacute.  And recommended MRI.  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  But he did have MRIs in 2010 so he should be out to have an MRI.  He does not have a pacemaker.  Discussed with the hospitalist patient will be [facility] is here get MRI in morning and if this is more of an acute stroke requiring admission patient will then be transferred to [facility].  Family was given the option of transferring him there tonight getting MRI but they preferred to have him admitted here where he can get some better rest overnight.  Rest of work-up without any significant findings.  Did have some ST segment depression on EKG but troponin was negative.  Urinalysis negative.  Labs without significant abnormalities.  Final Clinical Impressions(s) / ED Diagnoses   Final diagnoses:  Cerebellar infarct [facility])    ED Discharge Orders    None       Fredia Sorrow, MD 09/04/17 [phone]

## 2017-09-04 NOTE — Progress Notes (Signed)
OT Cancellation Note  Patient Details Name: Semaje Kinker MRN: [id number] DOB: 08/31/1921   Cancelled Treatment:    Reason Eval/Treat Not Completed: Fatigue/lethargy limiting ability to participate  Will check on pt next day.  Kari Baars, [place]  Payton Mccallum D 09/04/2017, 3:39 PM

## 2017-09-04 NOTE — ED Notes (Signed)
ED TO INPATIENT HANDOFF REPORT  Name/Age/Gender Antonio Buchanan 84 y.o. male  Code Status    Code Status Orders  (From admission, onward)        Start     Ordered   09/04/17 0036  Full code  Continuous     09/04/17 0103    Code Status History    Date Active Date Inactive Code Status Order ID Comments User Context   09/08/2014 1742 09/13/2014 1601 DNR [id number]  Eugenie Filler, MD Inpatient      Home/SNF/Other Home  Chief Complaint altered mental; fall  Level of Care/Admitting Diagnosis ED Disposition    ED Disposition Condition [facility] Area: [facility] [[id number]]  Level of Care: Telemetry [5]  Admit to tele based on following criteria: Other see comments  Comments: Probable subacute stroke  Diagnosis: Acute arterial ischemic stroke, vertebrobasilar, cerebellar [facility]) [[id number]]  Admitting Physician: Doreatha Massed  Attending Physician: Etta Quill [phone]  Estimated length of stay: past midnight tomorrow  Certification:: I certify this patient will need inpatient services for at least 2 midnights  PT Class (Do Not Modify): Inpatient [101]  PT Acc Code (Do Not Modify): Private [1]       Medical History Past Medical History:  Diagnosis Date  . Anxiety   . Aortic stenosis   . Benign neoplasm of colon   . Cancer ([place])    left cheek  . Diabetes mellitus   . Diverticulosis of colon (without mention of hemorrhage) 02/03/2005  . Hemorrhoids 02/03/2005   Internal and external  . History of Lyme disease   . HTN (hypertension)   . Hyperlipemia   . Personal history of colonic polyps   . Phlebitis 06-24-2014   left leg  . Polymyalgia rheumatica ([place])   . PTSD (post-traumatic stress disorder)   . Spinal stenosis   . Thalassemia   . Varicose veins     Allergies Allergies  Allergen Reactions  . Cephalosporins Hives and Itching  . Lamisil Af Defense [Tolnaftate]     Severe hives    IV  Location/Drains/Wounds Patient Lines/Drains/Airways Status   Active Line/Drains/Airways    Name:   Placement date:   Placement time:   Site:   Days:   Peripheral IV 09/03/17 Left Antecubital   09/03/17    2112    Antecubital   1          Labs/Imaging Results for orders placed or performed during the hospital encounter of 09/03/17 (from the past 48 hour(s))  Comprehensive metabolic panel     Status: Abnormal   Collection Time: 09/03/17  8:25 PM  Result Value Ref Range   Sodium 139 135 - 145 mmol/L   Potassium 4.7 3.5 - 5.1 mmol/L   Chloride 103 101 - 111 mmol/L   CO2 27 22 - 32 mmol/L   Glucose, Bld 207 (H) 65 - 99 mg/dL   BUN 43 (H) 6 - 20 mg/dL   Creatinine, Ser 1.33 (H) 0.61 - 1.24 mg/dL   Calcium 8.2 (L) 8.9 - 10.3 mg/dL   Total Protein 5.9 (L) 6.5 - 8.1 g/dL   Albumin 3.4 (L) 3.5 - 5.0 g/dL   AST 16 15 - 41 U/L   ALT 16 (L) 17 - 63 U/L   Alkaline Phosphatase 100 38 - 126 U/L   Total Bilirubin 0.8 0.3 - 1.2 mg/dL   GFR calc non Af Amer 44 (L) >60 mL/min   GFR calc  Af Amer 51 (L) >60 mL/min    Comment: (NOTE) The eGFR has been calculated using the CKD EPI equation. This calculation has not been validated in all clinical situations. eGFR's persistently <60 mL/min signify possible Chronic Kidney Disease.    Anion gap 9 5 - 15    Comment: Performed at [facility], [place] [address] [place], [place] [address]  Lipase, blood     Status: None   Collection Time: 09/03/17  8:25 PM  Result Value Ref Range   Lipase 32 11 - 51 U/L    Comment: Performed at [facility], [place] [address] [place], [place] [address]  CBC with Differential/Platelet     Status: Abnormal   Collection Time: 09/03/17  8:25 PM  Result Value Ref Range   WBC 5.6 4.0 - 10.5 K/uL   RBC 5.04 4.22 - 5.81 MIL/uL   Hemoglobin 13.7 13.0 - 17.0 g/dL   HCT 41.7 39.0 - 52.0 %   MCV 82.7 78.0 - 100.0 fL   MCH 27.2 26.0 - 34.0 pg   MCHC 32.9 30.0 - 36.0 g/dL   RDW 16.5 (H)  11.5 - 15.5 %   Platelets 187 150 - 400 K/uL   Neutrophils Relative % 81 %   Neutro Abs 4.5 1.7 - 7.7 K/uL   Lymphocytes Relative 10 %   Lymphs Abs 0.6 (L) 0.7 - 4.0 K/uL   Monocytes Relative 7 %   Monocytes Absolute 0.4 0.1 - 1.0 K/uL   Eosinophils Relative 2 %   Eosinophils Absolute 0.1 0.0 - 0.7 K/uL   Basophils Relative 0 %   Basophils Absolute 0.0 0.0 - 0.1 K/uL    Comment: Performed at [facility], [place] [address] [place], [place] [address]  Urinalysis, Routine w reflex microscopic     Status: None   Collection Time: 09/03/17  9:32 PM  Result Value Ref Range   Color, Urine YELLOW YELLOW   APPearance CLEAR CLEAR   Specific Gravity, Urine 1.013 1.005 - 1.030   pH 5.0 5.0 - 8.0   Glucose, UA NEGATIVE NEGATIVE mg/dL   Hgb urine dipstick NEGATIVE NEGATIVE   Bilirubin Urine NEGATIVE NEGATIVE   Ketones, ur NEGATIVE NEGATIVE mg/dL   Protein, ur NEGATIVE NEGATIVE mg/dL   Nitrite NEGATIVE NEGATIVE   Leukocytes, UA NEGATIVE NEGATIVE    Comment: Performed at [facility] [address], [place], [place] [address]  I-stat troponin, ED     Status: None   Collection Time: 09/03/17 10:13 PM  Result Value Ref Range   Troponin i, poc 0.02 0.00 - 0.08 ng/mL   Comment 3            Comment: Due to the release kinetics of cTnI, a negative result within the first hours of the onset of symptoms does not rule out myocardial infarction with certainty. If myocardial infarction is still suspected, repeat the test at appropriate intervals.    Dg Chest 2 View  Result Date: 09/03/2017 CLINICAL DATA:  84 y/o  M; frequent falls, change in mental status. EXAM: CHEST - 2 VIEW COMPARISON:  09/11/2014 chest radiograph. FINDINGS: Stable cardiac silhouette given projection and technique. Aortic atherosclerosis with calcification. Pulmonary venous hypertension and mild reticular opacities of the lungs. No consolidation. No pleural effusion or pneumothorax. No acute  osseous abnormality is evident. IMPRESSION: Pulmonary venous hypertension and mild interstitial edema. No focal consolidation. Aortic atherosclerosis. Electronically Signed   By: Kristine Garbe M.D.   On:  09/03/2017 21:05   Ct Head Wo Contrast  Result Date: 09/03/2017 CLINICAL DATA:  Altered LOC, frequent falling EXAM: CT HEAD WITHOUT CONTRAST TECHNIQUE: Contiguous axial images were obtained from the base of the skull through the vertex without intravenous contrast. COMPARISON:  None. FINDINGS: Brain: No hemorrhage or intracranial mass is visualized. Age indeterminate hypodensity in the left cerebellum. Marked atrophy. Mild small vessel ischemic changes of the white matter. Slightly prominent ventricles, felt secondary to atrophy. Vascular: No hyperdense vessels.  Carotid vascular calcification. Skull: No fracture. Sinuses/Orbits: No acute finding. Other: None IMPRESSION: 1. Age indeterminate hypodensity/lacunar infarct in the left cerebellum. 2. Atrophy and mild small vessel ischemic changes of the white matter. Electronically Signed   By: Donavan Foil M.D.   On: 09/03/2017 21:27    Pending Labs Unresulted Labs (From admission, onward)   Start     Ordered   09/04/17 0500  Hemoglobin A1c  Tomorrow morning,   R     09/04/17 0103   09/04/17 0500  Lipid panel  Tomorrow morning,   R    Comments:  Fasting    09/04/17 0103   09/03/17 2147  Troponin I  STAT,   STAT     09/03/17 2146      Vitals/Pain Today's Vitals   09/04/17 0015 09/04/17 0030 09/04/17 0045 09/04/17 0100  BP:    103/79  Pulse: 72 61 67 62  Resp: 19 13 20  (!) 23  Temp:      TempSrc:      SpO2: 94% 94% 93% 96%    Isolation Precautions No active isolations  Medications Medications  0.9 %  sodium chloride infusion ( Intravenous New Bag/Given 09/03/17 2217)   stroke: mapping our early stages of recovery book (has no administration in time range)  acetaminophen (TYLENOL) tablet 650 mg (has no administration in  time range)    Or  acetaminophen (TYLENOL) solution 650 mg (has no administration in time range)    Or  acetaminophen (TYLENOL) suppository 650 mg (has no administration in time range)  enoxaparin (LOVENOX) injection 40 mg (has no administration in time range)  aspirin suppository 300 mg (has no administration in time range)    Or  aspirin tablet 325 mg (has no administration in time range)  escitalopram (LEXAPRO) tablet 20 mg (has no administration in time range)  LORazepam (ATIVAN) tablet 0.5 mg (has no administration in time range)  pantoprazole (PROTONIX) EC tablet 40 mg (has no administration in time range)  sacubitril-valsartan (ENTRESTO) 24-26 mg per tablet (has no administration in time range)  predniSONE (DELTASONE) tablet 5 mg (has no administration in time range)  metoprolol succinate (TOPROL-XL) 24 hr tablet 25 mg (has no administration in time range)  insulin aspart (novoLOG) injection 0-15 Units (has no administration in time range)  sodium chloride 0.9 % bolus 250 mL (0 mLs Intravenous Stopped 09/03/17 2144)    Mobility non-ambulatory

## 2017-09-04 NOTE — Progress Notes (Signed)
   Follow Up Note  HPI: Please refer to full H&P done earlier this morning Briefly, patient is a 84 year old male with history of dilated cardiomyopathy EF of 20%, hypertension, diabetes, hyperlipidemia, PTSD presented to the ER with recurrent falls, altered mental status.  MRI brain showed acute infarct likely embolic in nature.  Further work-up in progress.  Neurology consulted.  Patient has no new complaints, denies any chest pain, shortness of breath, abdominal pain.  No focal neurologic deficits noted.  Exam: CV: S1-S2 present Lungs: CTAB Abd: Soft, nontender, nondistended, bowel sounds present Ext: No pedal edema bilaterally  Present on Admission: . Acute arterial ischemic stroke, vertebrobasilar, cerebellar ([place]) . Delirium . Ataxic gait . Congestive dilated cardiomyopathy ([place]) . HTN (hypertension)   Plan Continue present management Appreciate neurology recommendations PT/OT pending

## 2017-09-04 NOTE — Progress Notes (Signed)
CRITICAL VALUE ALERT  Critical Value:  Troponin 0.04  Date & Time Notied:  09/04/17 0450  Provider Notified: Silas Sacramento NP   Orders Received/Actions taken:No new orders at this time.

## 2017-09-04 NOTE — Progress Notes (Signed)
PT Cancellation Note  Patient Details Name: Mustafa Potts MRN: [id number] DOB: 28-Jun-1921   Cancelled Treatment:    Reason Eval/Treat Not Completed: Patient's level of consciousness. PT attempted eval x2; patient non-responsive to name. PT consulted with nursing. Patient has had several tests done today after arriving at hospital last night and is too tired today; defer PT.    Floria Raveling. Hartnett-Rands, MS, PT Per [facility] #[id number] 09/04/2017, 2:44 PM

## 2017-09-04 NOTE — H&P (Signed)
History and Physical    Keithon Mccoin QIW:[id number] DOB: [birth year] DOA: 09/03/2017  PCP: Leanna Battles, MD  Patient coming from: Home  I have personally briefly reviewed patient's old medical records in [place]  Chief Complaint: Falls, AMS  HPI: Antonio Buchanan is a 84 y.o. male with medical history significant of HTN, dilated cardiomyopathy, DM2, PTSD.  Patient has had frequent falls and loss of ballance over the past 2 weeks.  On Friday he had hallucinations and AMS.  That seems to have improved on Sat, though he has been very fatigued this weekend.   ED Course: Work up in the ED is most notable for CT head which shows area that neuro-radiologist thinks is acute to subacute cerebellar infarct.  Hospitalist asked to admit.   Review of Systems: As per HPI otherwise 10 point review of systems negative.   Past Medical History:  Diagnosis Date  . Anxiety   . Aortic stenosis   . Benign neoplasm of colon   . Cancer ([place])    left cheek  . Diabetes mellitus   . Diverticulosis of colon (without mention of hemorrhage) 02/03/2005  . Hemorrhoids 02/03/2005   Internal and external  . History of Lyme disease   . HTN (hypertension)   . Hyperlipemia   . Personal history of colonic polyps   . Phlebitis 06-24-2014   left leg  . Polymyalgia rheumatica ([place])   . PTSD (post-traumatic stress disorder)   . Spinal stenosis   . Thalassemia   . Varicose veins     Past Surgical History:  Procedure Laterality Date  . CATARACT EXTRACTION, BILATERAL    . HEMORROIDECTOMY    . KIDNEY STONE SURGERY       reports that he has quit smoking. His smoking use included cigars. He quit after 78.00 years of use. He has never used smokeless tobacco. He reports that he does not drink alcohol or use drugs.  Allergies  Allergen Reactions  . Cephalosporins Hives and Itching  . Lamisil Af Defense [Tolnaftate]     Severe hives    Family History  Problem Relation Age of  Onset  . Diabetes Father   . Leukemia Mother   . Hypertension Mother   . Arthritis Mother   . Diabetes Sister      Prior to Admission medications   Medication Sig Start Date End Date Taking? Authorizing Provider  Coenzyme Q10 (CO Q-10 PO) Take 1 tablet by mouth daily.   Yes [provider]  escitalopram (LEXAPRO) 20 MG tablet Take 20 mg by mouth daily.   Yes [provider]  glipiZIDE (GLUCOTROL) 5 MG tablet Take 5 mg by mouth as needed.    Yes [provider]  LORazepam (ATIVAN) 0.5 MG tablet Take 0.5 mg by mouth 2 (two) times daily.   Yes [provider]  Menaquinone-7 (VITAMIN K2 PO) Take 300 mg by mouth daily.   Yes [provider]  metFORMIN (GLUCOPHAGE) 500 MG tablet Take 250 mg by mouth daily. Pt takes half of a 500mg  tablet each morning.   Yes [provider]  metoprolol succinate (TOPROL-XL) 25 MG 24 hr tablet Take 1 tablet (25 mg total) by mouth 2 (two) times daily. Patient taking differently: Take 25 mg by mouth daily.  08/07/17  Yes Lendon Colonel, NP  multivitamin-lutein [facility]) CAPS capsule Take 2 capsules by mouth daily.    Yes [provider]  pantoprazole (PROTONIX) 40 MG tablet Take 1 tablet by mouth every morning.  Patient taking differently: Take 40 mg by mouth daily. Take 1 tablet by mouth every morning. 06/21/17  Yes Esterwood, Amy S, PA-C  predniSONE (DELTASONE) 5 MG tablet Take 5 mg by mouth daily with breakfast.   Yes [provider]  sacubitril-valsartan (ENTRESTO) 24-26 MG Take 1 tablet by mouth 2 (two) times daily. Patient taking differently: Take 1 tablet by mouth daily.  08/07/17  Yes Lendon Colonel, NP  spironolactone (ALDACTONE) 25 MG tablet Take 0.5 tablets (12.5 mg total) by mouth daily. 08/07/17  Yes Lendon Colonel, NP    Physical Exam: Vitals:   09/03/17 1918 09/03/17 2132 09/03/17 2200  BP: 120/71 125/75 120/67  Pulse: 74 69 65  Resp: 18 14 19   Temp: 98.3 F  (36.8 C)    TempSrc: Oral    SpO2: 96% 94% 96%    Constitutional: NAD, calm, comfortable, Falls asleep easily, though it is 1AM Eyes: PERRL, lids and conjunctivae normal ENMT: Mucous membranes are Dry. Posterior pharynx clear of any exudate or lesions.Normal dentition.  Neck: normal, supple, no masses, no thyromegaly Respiratory: clear to auscultation bilaterally, no wheezing, no crackles. Normal respiratory effort. No accessory muscle use.  Cardiovascular: Regular rate and rhythm, no murmurs / rubs / gallops. Mild BLE Edema. 2+ pedal pulses. No carotid bruits.  Abdomen: no tenderness, no masses palpated. No hepatosplenomegaly. Bowel sounds positive.  Musculoskeletal: no clubbing / cyanosis. No joint deformity upper and lower extremities. Good ROM, no contractures. Normal muscle tone.  Skin: no rashes, lesions, ulcers. No induration Neurologic: Poor balance, Gait not tested.  Seems to have dysmetria on finger to nose testing. Psychiatric: Some confusion, but alert and conversant.   Labs on Admission: I have personally reviewed following labs and imaging studies  CBC: Recent Labs  Lab 09/03/17 2025  WBC 5.6  NEUTROABS 4.5  HGB 13.7  HCT 41.7  MCV 82.7  PLT 628   Basic Metabolic Panel: Recent Labs  Lab 09/03/17 2025  NA 139  K 4.7  CL 103  CO2 27  GLUCOSE 207*  BUN 43*  CREATININE 1.33*  CALCIUM 8.2*   GFR: CrCl cannot be calculated (Unknown ideal weight.). Liver Function Tests: Recent Labs  Lab 09/03/17 2025  AST 16  ALT 16*  ALKPHOS 100  BILITOT 0.8  PROT 5.9*  ALBUMIN 3.4*   Recent Labs  Lab 09/03/17 2025  LIPASE 32   No results for input(s): AMMONIA in the last 168 hours. Coagulation Profile: No results for input(s): INR, PROTIME in the last 168 hours. Cardiac Enzymes: No results for input(s): CKTOTAL, CKMB, CKMBINDEX, TROPONINI in the last 168 hours. BNP (last 3 results) No results for input(s): PROBNP in the last 8760 hours. HbA1C: No results  for input(s): HGBA1C in the last 72 hours. CBG: No results for input(s): GLUCAP in the last 168 hours. Lipid Profile: No results for input(s): CHOL, HDL, LDLCALC, TRIG, CHOLHDL, LDLDIRECT in the last 72 hours. Thyroid Function Tests: No results for input(s): TSH, T4TOTAL, FREET4, T3FREE, THYROIDAB in the last 72 hours. Anemia Panel: No results for input(s): VITAMINB12, FOLATE, FERRITIN, TIBC, IRON, RETICCTPCT in the last 72 hours. Urine analysis:    Component Value Date/Time   COLORURINE YELLOW 09/03/2017 2132   APPEARANCEUR CLEAR 09/03/2017 2132   LABSPEC 1.013 09/03/2017 2132   PHURINE 5.0 09/03/2017 2132   GLUCOSEU NEGATIVE 09/03/2017 2132   HGBUR NEGATIVE 09/03/2017 2132   BILIRUBINUR NEGATIVE 09/03/2017 2132   [place] NEGATIVE 09/03/2017 2132   PROTEINUR NEGATIVE 09/03/2017 2132  UROBILINOGEN 0.2 09/08/2014 2011   NITRITE NEGATIVE 09/03/2017 2132   LEUKOCYTESUR NEGATIVE 09/03/2017 2132    Radiological Exams on Admission: Dg Chest 2 View  Result Date: 09/03/2017 CLINICAL DATA:  84 y/o  M; frequent falls, change in mental status. EXAM: CHEST - 2 VIEW COMPARISON:  09/11/2014 chest radiograph. FINDINGS: Stable cardiac silhouette given projection and technique. Aortic atherosclerosis with calcification. Pulmonary venous hypertension and mild reticular opacities of the lungs. No consolidation. No pleural effusion or pneumothorax. No acute osseous abnormality is evident. IMPRESSION: Pulmonary venous hypertension and mild interstitial edema. No focal consolidation. Aortic atherosclerosis. Electronically Signed   By: Kristine Garbe M.D.   On: 09/03/2017 21:05   Ct Head Wo Contrast  Result Date: 09/03/2017 CLINICAL DATA:  Altered LOC, frequent falling EXAM: CT HEAD WITHOUT CONTRAST TECHNIQUE: Contiguous axial images were obtained from the base of the skull through the vertex without intravenous contrast. COMPARISON:  None. FINDINGS: Brain: No hemorrhage or intracranial mass  is visualized. Age indeterminate hypodensity in the left cerebellum. Marked atrophy. Mild small vessel ischemic changes of the white matter. Slightly prominent ventricles, felt secondary to atrophy. Vascular: No hyperdense vessels.  Carotid vascular calcification. Skull: No fracture. Sinuses/Orbits: No acute finding. Other: None IMPRESSION: 1. Age indeterminate hypodensity/lacunar infarct in the left cerebellum. 2. Atrophy and mild small vessel ischemic changes of the white matter. Electronically Signed   By: Donavan Foil M.D.   On: 09/03/2017 21:27    EKG: Independently reviewed.  Assessment/Plan Principal Problem:   Acute arterial ischemic stroke, vertebrobasilar, cerebellar (HCC) Active Problems:   Diabetes mellitus ([place])   HTN (hypertension)   Congestive dilated cardiomyopathy (HCC)   Delirium   Ataxic gait    1. Subacute cerebellar stroke - suspect this as cause of falls for past 2 weeks. 1. Stroke pathway and work up 2. MRI/MRA in AM to confirm 3. Consult neurology formally if stroke confirmed 4. Remainder of work up also ordered. 5. ASA 325 for now 6. [facility] beds [facility] right now so will keep him over here for now.  Sent text message to Dr. Leonel Ramsay to this effect. 7. PT/OT/SLP 2. DM2 - 1. Hold home PO meds 2. SSI mod scale AC 3. HTN - 1. Continue home meds for now 2. But will hold Lasix and spironolactone and gently hydrate for the moment. 4. Delirium - 1. Had what sounds like Agitated delirium on Friday.  Now either resolved or having mild somnolence (or he could just be sleepy because its 1am). 5. ? Urinary retention - 1. Sounds like he just has difficulty urinating when laying down. 2. Keep an eye on this, if not urinating overnight then needs bladder scan and possibly foley, but has had urinary incontinence these past several days per family.  DVT prophylaxis: Lovenox Code Status: Full Family Communication: Daughter at bedside Disposition Plan: Home  after admit Consults called: None formally Admission status: Admit to inpatient - IP status for AMS, likely subacute stroke, inability to safely ambulate   Etta Quill DO [facility] Pager [phone] Only works nights!  If 7AM-7PM, please contact the primary day team physician taking care of patient  [email or web address] Password [email or web address]  09/04/2017, 1:08 AM

## 2017-09-05 ENCOUNTER — Inpatient Hospital Stay (HOSPITAL_COMMUNITY): Payer: Medicare Other

## 2017-09-05 DIAGNOSIS — I361 Nonrheumatic tricuspid (valve) insufficiency: Secondary | ICD-10-CM

## 2017-09-05 LAB — CBC WITH DIFFERENTIAL/PLATELET
BASOS PCT: 1 %
Basophils Absolute: 0 10*3/uL (ref 0.0–0.1)
Eosinophils Absolute: 0.2 10*3/uL (ref 0.0–0.7)
Eosinophils Relative: 4 %
HCT: 41.2 % (ref 39.0–52.0)
HEMOGLOBIN: 13.2 g/dL (ref 13.0–17.0)
LYMPHS ABS: 1 10*3/uL (ref 0.7–4.0)
Lymphocytes Relative: 17 %
MCH: 26.6 pg (ref 26.0–34.0)
MCHC: 32 g/dL (ref 30.0–36.0)
MCV: 82.9 fL (ref 78.0–100.0)
MONO ABS: 0.5 10*3/uL (ref 0.1–1.0)
Monocytes Relative: 9 %
NEUTROS ABS: 4.1 10*3/uL (ref 1.7–7.7)
Neutrophils Relative %: 69 %
PLATELETS: 213 10*3/uL (ref 150–400)
RBC: 4.97 MIL/uL (ref 4.22–5.81)
RDW: 16.7 % — ABNORMAL HIGH (ref 11.5–15.5)
WBC: 5.8 10*3/uL (ref 4.0–10.5)

## 2017-09-05 LAB — BASIC METABOLIC PANEL
Anion gap: 10 (ref 5–15)
BUN: 32 mg/dL — ABNORMAL HIGH (ref 6–20)
CO2: 28 mmol/L (ref 22–32)
CREATININE: 1.56 mg/dL — AB (ref 0.61–1.24)
Calcium: 8.1 mg/dL — ABNORMAL LOW (ref 8.9–10.3)
Chloride: 103 mmol/L (ref 101–111)
GFR, EST AFRICAN AMERICAN: 42 mL/min — AB (ref >60)
GFR, EST NON AFRICAN AMERICAN: 36 mL/min — AB (ref >60)
Glucose, Bld: 168 mg/dL — ABNORMAL HIGH (ref 65–99)
Potassium: 4.2 mmol/L (ref 3.5–5.1)
SODIUM: 141 mmol/L (ref 135–145)

## 2017-09-05 LAB — ECHOCARDIOGRAM COMPLETE
HEIGHTINCHES: 64 in
WEIGHTICAEL: 2417.6 [oz_av]

## 2017-09-05 LAB — GLUCOSE, CAPILLARY
GLUCOSE-CAPILLARY: 167 mg/dL — AB (ref 65–99)
Glucose-Capillary: 194 mg/dL — ABNORMAL HIGH (ref 65–99)

## 2017-09-05 LAB — MAGNESIUM: MAGNESIUM: 1.2 mg/dL — AB (ref 1.7–2.4)

## 2017-09-05 MED ORDER — ATORVASTATIN CALCIUM 40 MG PO TABS: 40.0000 mg | ORAL_TABLET | Freq: Every day | ORAL | 0 refills | Status: DC

## 2017-09-05 MED ORDER — VITAMIN B-12 1000 MCG PO TABS: 1000.0000 ug | ORAL_TABLET | Freq: Every day | ORAL | 0 refills | Status: AC

## 2017-09-05 MED ORDER — MAGNESIUM SULFATE 4 GM/100ML IV SOLN
4.0000 g | Freq: Once | INTRAVENOUS | Status: AC
  Administered 2017-09-05: 4 g via INTRAVENOUS
  Filled 2017-09-05: qty 100

## 2017-09-05 MED ORDER — ATORVASTATIN CALCIUM 40 MG PO TABS
40.0000 mg | ORAL_TABLET | Freq: Every day | ORAL | Status: DC
  Administered 2017-09-05: 40 mg via ORAL
  Filled 2017-09-05: qty 1

## 2017-09-05 MED ORDER — ASPIRIN 325 MG PO TABS: 325.0000 mg | ORAL_TABLET | Freq: Every day | ORAL | 0 refills | Status: AC

## 2017-09-05 MED ORDER — CLOPIDOGREL BISULFATE 75 MG PO TABS: 75.0000 mg | ORAL_TABLET | Freq: Every day | ORAL | 0 refills | Status: AC

## 2017-09-05 MED ORDER — METOPROLOL SUCCINATE ER 25 MG PO TB24: 25.0000 mg | ORAL_TABLET | Freq: Every day | ORAL | 0 refills | Status: AC

## 2017-09-05 NOTE — Progress Notes (Signed)
Pt discharged to home, instructions reviewed with pt, daughter and son n Sports coach. Acknowledged understanding. Reviewed stroke risk with family and acknowledged understanding. Pt stable however, restless and impulsive at times attempting to get OOB without calling. Pt anxious to return home. Daughter assist with dressing, SRP, RN

## 2017-09-05 NOTE — Progress Notes (Signed)
Interim Note  Reviewed Echo results - No LV thrombus.  Plan: continue ASA + plavix x3 weeks Outpatient follow up with Dr Leonie Man at [facility] in 66month

## 2017-09-05 NOTE — Care Management Note (Signed)
Case Management Note  Patient Details  Name: Antonio Buchanan MRN: [id number] Date of Birth: [birth year]  Subjective/Objective:    Acute Arterial ischemic stroke, vertebrobasilar, cerebellar.               Action/Plan: Pt plan to discharge home with [place]. Pt have 24/7 hour care at home.    Expected Discharge Date:                  Expected Discharge Plan:  [place]  In-House Referral:  Clinical Social Work  Discharge planning Services  CM Consult  Post Acute Care Choice:    Choice offered to:     DME Arranged:    DME Agency:     HH Arranged:    [place] Agency:     Status of Service:  In process, will continue to follow  If discussed at Long Length of Stay Meetings, dates discussed:    Additional CommentsPurcell Mouton, RN 09/05/2017, 2:21 PM

## 2017-09-05 NOTE — Progress Notes (Signed)
Patient had 6 beat run of v tach while sleeping. On coming RN notified.

## 2017-09-05 NOTE — Progress Notes (Addendum)
Spoke daughter Minus [place] and will pick up pt at 4:30 pm. SRP, RN

## 2017-09-05 NOTE — Progress Notes (Signed)
  Echocardiogram 2D Echocardiogram has been performed.  Amany Rando T Sadye Kiernan 09/05/2017, 3:07 PM

## 2017-09-05 NOTE — Evaluation (Signed)
Physical Therapy Evaluation Patient Details Name: Antonio Buchanan MRN: [id number] DOB: May 11, 1921 Today's Date: 09/05/2017   History of Present Illness  84 yo male adm to [facility] with speech deficits, AMS, recent falls x2 weeks.  Pt with h/o PTSD, dilated cardiomyopathy, HTN, PMR, lyme disease, spintal stenosis, left cheek cancer, cigarette/cigar use quit after 70 years of use.  Pt is a [employer] vet - and received 2 purple hearts - he received war disabiltiy.  Pt with subacute or acute cerebellar cva  Clinical Impression  The patient is ambulating with assistance. Recommend 24/7 caregivers. Pt admitted with above diagnosis. Pt currently with functional limitations due to the deficits listed below (see PT Problem List).  Pt will benefit from skilled PT to increase their independence and safety with mobility to allow discharge to the venue listed below.       Follow Up Recommendations Home health PT;Supervision/Assistance - 24 hour    Equipment Recommendations  None recommended by PT    Recommendations for Other Services       Precautions / Restrictions Precautions Precautions: Fall      Mobility  Bed Mobility Overal bed mobility: Needs Assistance Bed Mobility: Supine to Sit     Supine to sit: Mod assist        Transfers Overall transfer level: Needs assistance Equipment used: Rolling walker (2 wheeled) Transfers: Sit to/from Stand Sit to Stand: Min assist Stand pivot transfers: Min assist;Mod assist       General transfer comment: steady assist to rise from the bed, cues for safety  Ambulation/Gait Ambulation/Gait assistance: Min assist Gait Distance (Feet): 100 Feet Assistive device: Rolling walker (2 wheeled) Gait Pattern/deviations: Step-through pattern     General Gait Details: 2 times patient "pretended " to lose balance. cues for safety  Stairs            Wheelchair Mobility    Modified Rankin (Stroke Patients Only)       Balance                                              Pertinent Vitals/Pain Pain Assessment: No/denies pain    Home Living Family/patient expects to be discharged to:: Private residence Living Arrangements: Spouse/significant other   Type of Home: House Home Access: Stairs to enter     Home Layout: [place]: Environmental consultant - 2 wheels;Wheelchair - manual;Cane - single point      Prior Function Level of Independence: Needs assistance   Gait / Transfers Assistance Needed: does not use RW,  ADL's / Homemaking Assistance Needed: has 24/7 caregivers for he and wiffe        Hand Dominance        Extremity/Trunk Assessment   Upper Extremity Assessment Upper Extremity Assessment: Overall WFL for tasks assessed LUE Deficits / Details: Pt not able to lift LUE past approx 70 degrees. Pt states it has been like this a few years/ RUE WFL    Lower Extremity Assessment Lower Extremity Assessment: Generalized weakness    Cervical / Trunk Assessment Cervical / Trunk Assessment: Kyphotic  Communication   Communication: HOH  Cognition Arousal/Alertness: Awake/alert Behavior During Therapy: WFL for tasks assessed/performed Overall Cognitive Status: No family/caregiver present to determine baseline cognitive functioning  General Comments: pt very HOH but  appropriate with PT and OT      General Comments      Exercises     Assessment/Plan    PT Assessment Patient needs continued PT services  PT Problem List Decreased strength;Decreased activity tolerance;Decreased balance;Decreased mobility       PT Treatment Interventions DME instruction;Gait training;Functional mobility training;Therapeutic activities;Patient/family education    PT Goals (Current goals can be found in the Care Plan section)  Acute Rehab PT Goals Patient Stated Goal: pt wants to go home PT Goal Formulation: With patient Time For Goal Achievement:  09/12/17 Potential to Achieve Goals: Good    Frequency Min 2X/week   Barriers to discharge        Co-evaluation               AM-PAC PT "6 Clicks" Daily Activity  Outcome Measure Difficulty turning over in bed (including adjusting bedclothes, sheets and blankets)?: A Little Difficulty moving from lying on back to sitting on the side of the bed? : A Little Difficulty sitting down on and standing up from a chair with arms (e.g., wheelchair, bedside commode, etc,.)?: A Lot Help needed moving to and from a bed to chair (including a wheelchair)?: A Lot Help needed walking in hospital room?: A Lot   6 Click Score: 12    End of Session Equipment Utilized During Treatment: Gait belt Activity Tolerance: Patient tolerated treatment well Patient left: in chair;with call bell/phone within reach;with chair alarm set Nurse Communication: Mobility status PT Visit Diagnosis: Unsteadiness on feet (R26.81)    Time: 0786-7544 PT Time Calculation (min) (ACUTE ONLY): 29 min   Charges:   PT Evaluation $PT Eval Low Complexity: 1 Low     PT G CodesTresa Endo PT [phone]  [name] 09/05/2017, 1:03 PM

## 2017-09-05 NOTE — Evaluation (Signed)
Occupational Therapy Evaluation Patient Details Name: Antonio Buchanan MRN: [id number] DOB: 06-Oct-1921 Today's Date: 09/05/2017    History of Present Illness 84 yo male adm to [facility] with speech deficits, AMS, recent falls x2 weeks.  Pt with h/o PTSD, dilated cardiomyopathy, HTN, PMR, lyme disease, spintal stenosis, left cheek cancer, cigarette/cigar use quit after 70 years of use.  Pt is a [employer] vet - and received 2 purple hearts - he received war disabiltiy.  Pt with subacute or acute cerebellar cva   Clinical Impression   Pt admitted with AMS and falls. Pt currently with functional limitations due to the deficits listed below (see OT Problem List).  Pt will benefit from skilled OT to increase their safety and independence with ADL and functional mobility for ADL to facilitate discharge to venue listed below.      Follow Up Recommendations  Home health OT;Supervision/Assistance - 24 hour    Equipment Recommendations  3 in 1 bedside commode    Recommendations for Other Services       Precautions / Restrictions Precautions Precautions: None      Mobility Bed Mobility Overal bed mobility: Needs Assistance Bed Mobility: Supine to Sit     Supine to sit: Mod assist        Transfers Overall transfer level: Needs assistance Equipment used: Rolling walker (2 wheeled) Transfers: Sit to/from [employer] Transfers Sit to Stand: Min assist;Mod assist Stand pivot transfers: Min assist;Mod assist       General transfer comment: pt with several losses of balance during OT session.  Pt with decreased awareness of LOB. Pt does report many falls at home        ADL either performed or assessed with clinical judgement   ADL Overall ADL's : Needs assistance/impaired Eating/Feeding: Set up;Sitting   Grooming: Set up;Sitting   Upper Body Bathing: Minimal assistance;Sitting   Lower Body Bathing: Moderate assistance;Sit to/from stand;Cueing for sequencing;Cueing for  safety   Upper Body Dressing : Set up;Sitting   Lower Body Dressing: Moderate assistance;Sit to/from stand;Cueing for sequencing;Cueing for safety   Toilet Transfer: Moderate assistance;BSC;Stand-pivot;Cueing for sequencing;Cueing for safety   Toileting- Clothing Manipulation and Hygiene: Maximal assistance;Sit to/from stand;Cueing for sequencing;Cueing for safety         General ADL Comments: Pt will need 24/7 A home with all ADL activity. Pt states he has [facility] as well as his wife and daughter     Vision Patient Visual Report: No change from baseline              Pertinent Vitals/Pain Pain Assessment: No/denies pain     Hand Dominance     Extremity/Trunk Assessment Upper Extremity Assessment Upper Extremity Assessment: LUE deficits/detail LUE Deficits / Details: Pt not able to lift LUE past approx 70 degrees. Pt states it has been like this a few years/ RUE WFL           Communication Communication Communication: HOH   Cognition Arousal/Alertness: Awake/alert Behavior During Therapy: WFL for tasks assessed/performed Overall Cognitive Status: No family/caregiver present to determine baseline cognitive functioning                                 General Comments: pt very HOH but  appropriate with PT and OT   General Comments               Home Living Family/patient expects to be discharged to:: Private residence  Living Arrangements: Spouse/significant other   Type of Home: House Home Access: Stairs to enter     Home Layout: One level     Bathroom Shower/Tub: Occupational psychologist: Standard     Home Equipment: Environmental consultant - 2 wheels;Wheelchair - manual;Cane - single point          Prior Functioning/Environment Level of Independence: Independent                 OT Problem List: Decreased strength;Decreased activity tolerance;Decreased knowledge of precautions;Decreased safety awareness;Impaired balance  (sitting and/or standing);Decreased knowledge of use of DME or AE      OT Treatment/Interventions: Self-care/ADL training;Patient/family education;Therapeutic activities    OT Goals(Current goals can be found in the care plan section) Acute Rehab OT Goals Patient Stated Goal: pt wants to go home OT Goal Formulation: With patient Time For Goal Achievement: 09/12/17 Potential to Achieve Goals: Good  OT Frequency: Min 2X/week              AM-PAC PT "6 Clicks" Daily Activity     Outcome Measure Help from another person eating meals?: A Little Help from another person taking care of personal grooming?: A Little Help from another person toileting, which includes using toliet, bedpan, or urinal?: A Lot Help from another person bathing (including washing, rinsing, drying)?: A Lot Help from another person to put on and taking off regular upper body clothing?: A Little Help from another person to put on and taking off regular lower body clothing?: A Lot 6 Click Score: 15   End of Session Equipment Utilized During Treatment: Rolling walker;Gait belt Nurse Communication: Mobility status  Activity Tolerance: Patient tolerated treatment well Patient left: in chair;with call bell/phone within reach;with chair alarm set  OT Visit Diagnosis: Unsteadiness on feet (R26.81);Muscle weakness (generalized) (M62.81);History of falling (Z91.81);Repeated falls (R29.6);Other abnormalities of gait and mobility (R26.89)                Time: 2993-7169 OT Time Calculation (min): 33 min Charges:  OT General Charges $OT Visit: 1 Visit OT Evaluation $OT Eval Moderate Complexity: 1 Mod G-Codes:     Kari Baars, [place]  Payton Mccallum D 09/05/2017, 11:35 AM

## 2017-09-05 NOTE — Telephone Encounter (Signed)
Please arrange APP visit and call him if he does not already have one. Thanks

## 2017-09-05 NOTE — Discharge Summary (Signed)
Discharge Summary  Antonio Buchanan DZH:[id number] DOB: 02-04-1922  PCP: Leanna Battles, MD  Admit date: 09/03/2017 Discharge date: 09/05/2017  Time spent: 40 mins  Recommendations for Outpatient Follow-up:  1. PCP 2. Neurology  Discharge Diagnoses:  Active Hospital Problems   Diagnosis Date Noted  . Acute arterial ischemic stroke, vertebrobasilar, cerebellar ([place]) 09/04/2017  . Delirium 09/04/2017  . Ataxic gait 09/04/2017  . Congestive dilated cardiomyopathy ([place]) 08/29/2014  . Diabetes mellitus ([place])   . HTN (hypertension)     Resolved Hospital Problems  No resolved problems to display.    Discharge Condition: Stable   Diet recommendation: Heart healthy  Vitals:   09/04/17 2104 09/05/17 0415  BP: 115/73 123/79  Pulse: 66 67  Resp: 17 18  Temp: 98 F (36.7 C) (!) 97.5 F (36.4 C)  SpO2: 100% 96%    History of present illness:  84 year old male with history of dilated cardiomyopathy with EF of 20%, hypertension, diabetes, hyperlipidemia, PTSD presented to the ER with recurrent falls and altered mental status.  CT head showed possible infarct, MRI brain showed acute infarct likely embolic in nature.  Neurology consulted.  Patient admitted for further management.   Today, patient reported feeling much better, denies any new symptoms, no focal neurologic deficit noted.  Patient denies any chest pains, headache, abdominal pain, shortness of breath, nausea/vomiting, fever/chills.  Patient stable to be discharged to follow-up with neurology and PCP.  Hospital Course:  Principal Problem:   Acute arterial ischemic stroke, vertebrobasilar, cerebellar (HCC) Active Problems:   Diabetes mellitus ([place])   HTN (hypertension)   Congestive dilated cardiomyopathy (HCC)   Delirium   Ataxic gait  Acute cerebral and cerebellar infarcts Patient presented with recurrent falls, altered mental status MRI showed multiple small, acute nonhemorrhagic cerebral and cerebellar  infarcts likely a shower of emboli.  Intracranial atherosclerotic disease No known history of A. Fib TSH 1.73 Had an echo in 07/2017 with a EF of 20%, repeat pending, neurology/PCP to follow-up Carotid Doppler showed right and left velocities in the ICA consistent with 1 to 39% stenosis Neurology on board, recommend ASA 325 mg daily and Plavix 75 mg daily for a total of 3 weeks due to patient's frequent falls and increased risk of bleeding Speech/PT/OT on board Follow-up with Dr. Leonie Man of neurology  Diabetes mellitus type 2 with neuropathy A1c 7.7 Continue home meds, PCP to adjust for better control  Hypertension Stable  Hyperlipidemia LDL 143 Start Lipitor 40, PCP should follow-up closely for symptoms and liver function tests, may consider stopping therapy/reducing dose, soon due to advanced age  CKD stage III Cr with a slight bump PCP to follow up with repeat labs  Chronic systolic heart failure Stable Continue home meds, follow with cardiology  Vitamin B12 deficiency Vitamin B12 level 137 Start vitamin B12 supplementation  Hypomagnesemia Replaced     Procedures:  None  Consultations:  Neurology  Discharge Exam: BP 123/79 (BP Location: Right Arm)   Pulse 67   Temp (!) 97.5 F (36.4 C) (Oral)   Resp 18   Ht 5\' 4"  (1.626 m)   Wt 68.5 kg (151 lb 1.6 oz)   SpO2 96%   BMI 25.94 kg/m   General: NAD Cardiovascular: S1, S2 present Respiratory: CTAB  Discharge Instructions You were cared for by a hospitalist during your hospital stay. If you have any questions about your discharge medications or the care you received while you were in the hospital after you are discharged, you can call the unit  and asked to speak with the hospitalist on call if the hospitalist that took care of you is not available. Once you are discharged, your primary care physician will handle any further medical issues. Please note that NO REFILLS for any discharge medications will be  authorized once you are discharged, as it is imperative that you return to your primary care physician (or establish a relationship with a primary care physician if you do not have one) for your aftercare needs so that they can reassess your need for medications and monitor your lab values.   Allergies as of 09/05/2017      Reactions   Cephalosporins Hives, Itching   Lamisil Af Defense [tolnaftate]    Severe hives      Medication List    TAKE these medications   aspirin 325 MG tablet Take 1 tablet (325 mg total) by mouth daily for 21 days. Start taking on:  09/06/2017   atorvastatin 40 MG tablet Commonly known as:  LIPITOR Take 1 tablet (40 mg total) by mouth daily.   clopidogrel 75 MG tablet Commonly known as:  PLAVIX Take 1 tablet (75 mg total) by mouth daily for 21 days.   CO Q-10 PO Take 1 tablet by mouth daily.   escitalopram 20 MG tablet Commonly known as:  LEXAPRO Take 20 mg by mouth daily.   glipiZIDE 5 MG tablet Commonly known as:  GLUCOTROL Take 5 mg by mouth as needed.   LORazepam 0.5 MG tablet Commonly known as:  ATIVAN Take 0.5 mg by mouth 2 (two) times daily.   metFORMIN 500 MG tablet Commonly known as:  GLUCOPHAGE Take 250 mg by mouth daily. Pt takes half of a 500mg  tablet each morning.   metoprolol succinate 25 MG 24 hr tablet Commonly known as:  TOPROL-XL Take 1 tablet (25 mg total) by mouth daily.   multivitamin-lutein Caps capsule Take 2 capsules by mouth daily.   pantoprazole 40 MG tablet Commonly known as:  PROTONIX Take 1 tablet by mouth every morning. What changed:    how much to take  how to take this  when to take this  additional instructions   predniSONE 5 MG tablet Commonly known as:  DELTASONE Take 5 mg by mouth daily with breakfast.   sacubitril-valsartan 24-26 MG Commonly known as:  ENTRESTO Take 1 tablet by mouth 2 (two) times daily. What changed:  when to take this   spironolactone 25 MG tablet Commonly known as:   ALDACTONE Take 0.5 tablets (12.5 mg total) by mouth daily.   vitamin B-12 1000 MCG tablet Commonly known as:  CYANOCOBALAMIN Take 1 tablet (1,000 mcg total) by mouth daily.   VITAMIN K2 PO Take 300 mg by mouth daily.      Allergies  Allergen Reactions  . Cephalosporins Hives and Itching  . Lamisil Af Defense [Tolnaftate]     Severe hives   Follow-up Information    [facility].   Why:  [facility] will call 24 to 48 hours after discharge. If there is no call please call the above number; [phone] Contact information: [address] [place] [address] [phone]        [facility] - Dme .   Contact information: [address] [address] [phone]        Garvin Fila, MD. Schedule an appointment as soon as possible for a visit in 2 week(s).   Specialties:  Neurology,  Radiology Contact information: [address] [place] [address] [phone]        Leanna Battles, MD. Schedule an appointment as soon as possible for a visit in 1 week(s).   Specialty:  Internal Medicine Contact information: [place] [address] [phone]        Lelon Perla, MD .   Specialty:  Cardiology Contact information: [address] [place] [address] [phone]            The results of significant diagnostics from this hospitalization (including imaging, microbiology, ancillary and laboratory) are listed below for reference.    Significant Diagnostic Studies: Dg Chest 2 View  Result Date: 09/03/2017 CLINICAL DATA:  84 y/o  M; frequent falls, change in mental status. EXAM: CHEST - 2 VIEW COMPARISON:  09/11/2014 chest radiograph. FINDINGS: Stable cardiac silhouette given projection and technique. Aortic atherosclerosis with calcification. Pulmonary venous hypertension and mild reticular opacities of the lungs. No consolidation. No  pleural effusion or pneumothorax. No acute osseous abnormality is evident. IMPRESSION: Pulmonary venous hypertension and mild interstitial edema. No focal consolidation. Aortic atherosclerosis. Electronically Signed   By: Kristine Garbe M.D.   On: 09/03/2017 21:05   Ct Head Wo Contrast  Result Date: 09/03/2017 CLINICAL DATA:  Altered LOC, frequent falling EXAM: CT HEAD WITHOUT CONTRAST TECHNIQUE: Contiguous axial images were obtained from the base of the skull through the vertex without intravenous contrast. COMPARISON:  None. FINDINGS: Brain: No hemorrhage or intracranial mass is visualized. Age indeterminate hypodensity in the left cerebellum. Marked atrophy. Mild small vessel ischemic changes of the white matter. Slightly prominent ventricles, felt secondary to atrophy. Vascular: No hyperdense vessels.  Carotid vascular calcification. Skull: No fracture. Sinuses/Orbits: No acute finding. Other: None IMPRESSION: 1. Age indeterminate hypodensity/lacunar infarct in the left cerebellum. 2. Atrophy and mild small vessel ischemic changes of the white matter. Electronically Signed   By: Donavan Foil M.D.   On: 09/03/2017 21:27   Mr Brain Wo Contrast  Result Date: 09/04/2017 CLINICAL DATA:  Ataxia, stroke suspected. Frequent falls and loss of balance over the past 2 weeks. Congestive heart failure with dilated cardiomyopathy. Hypertension. EXAM: MRI HEAD WITHOUT CONTRAST MRA HEAD WITHOUT CONTRAST TECHNIQUE: Multiplanar, multiecho pulse sequences of the brain and surrounding structures were obtained without intravenous contrast. Angiographic images of the head were obtained using MRA technique without contrast. COMPARISON:  CT head 09/03/2017. FINDINGS: MRI HEAD FINDINGS Brain: In the LEFT inferior cerebellum, affecting cortex and white matter, is a slightly greater than 1 cm area of restricted diffusion, corresponding low ADC, correlating with the CT abnormality, consistent with an acute infarct.  Additional smaller, subcentimeter acute infarcts are noted in the midline vermis, RIGHT occipital pole cortex, and medial RIGHT posterior parietal cortex. None are hemorrhagic. There is no mass lesion or extra-axial fluid. Hydrocephalus ex vacuo with dilated ventricles. Advanced atrophy. T2 and FLAIR hyperintensities in the white matter consistent with small vessel disease, not unexpected for age. Vascular: Reported separately. Skull and upper cervical spine: No tonsillar herniation. Normal marrow signal. Cervical spondylosis. Sinuses/Orbits: No sinus opacity.  BILATERAL cataract extraction. Other: None. MRA HEAD FINDINGS Mild nonstenotic irregularity of the cavernous internal carotid arteries bilaterally. Possible 50% stenosis of the RIGHT M1 MCA at its origin and LEFT A1 ACA at its origin. Mild nonstenotic irregularity of both superior and inferior RIGHT M2 MCA branches. High-grade stenosis LEFT M2 superior MCA branch. Anterior communicating artery and distal anterior cerebral arteries patent. Mild to moderate irregularity  of the MCA M3 branches bilaterally. Basilar artery widely patent and gives rise to both posterior cerebral arteries which are also widely patent. Vertebral arteries are codominant without distal flow-limiting stenosis. Poor visualization of the RIGHT PICA origin. Poor visualization of the RIGHT PICA and AICA origin. IMPRESSION: Multiple small, acute, nonhemorrhagic cerebral and cerebellar infarcts. Given the different vascular territories involved, these may represent a shower of emboli. Intracranial atherosclerotic disease as described, although no vertebral, basilar, or PCA disease of significance is observed. Electronically Signed   By: Staci Righter M.D.   On: 09/04/2017 13:59   Mr Jodene Nam Head Wo Contrast  Result Date: 09/04/2017 CLINICAL DATA:  Ataxia, stroke suspected. Frequent falls and loss of balance over the past 2 weeks. Congestive heart failure with dilated cardiomyopathy.  Hypertension. EXAM: MRI HEAD WITHOUT CONTRAST MRA HEAD WITHOUT CONTRAST TECHNIQUE: Multiplanar, multiecho pulse sequences of the brain and surrounding structures were obtained without intravenous contrast. Angiographic images of the head were obtained using MRA technique without contrast. COMPARISON:  CT head 09/03/2017. FINDINGS: MRI HEAD FINDINGS Brain: In the LEFT inferior cerebellum, affecting cortex and white matter, is a slightly greater than 1 cm area of restricted diffusion, corresponding low ADC, correlating with the CT abnormality, consistent with an acute infarct. Additional smaller, subcentimeter acute infarcts are noted in the midline vermis, RIGHT occipital pole cortex, and medial RIGHT posterior parietal cortex. None are hemorrhagic. There is no mass lesion or extra-axial fluid. Hydrocephalus ex vacuo with dilated ventricles. Advanced atrophy. T2 and FLAIR hyperintensities in the white matter consistent with small vessel disease, not unexpected for age. Vascular: Reported separately. Skull and upper cervical spine: No tonsillar herniation. Normal marrow signal. Cervical spondylosis. Sinuses/Orbits: No sinus opacity.  BILATERAL cataract extraction. Other: None. MRA HEAD FINDINGS Mild nonstenotic irregularity of the cavernous internal carotid arteries bilaterally. Possible 50% stenosis of the RIGHT M1 MCA at its origin and LEFT A1 ACA at its origin. Mild nonstenotic irregularity of both superior and inferior RIGHT M2 MCA branches. High-grade stenosis LEFT M2 superior MCA branch. Anterior communicating artery and distal anterior cerebral arteries patent. Mild to moderate irregularity of the MCA M3 branches bilaterally. Basilar artery widely patent and gives rise to both posterior cerebral arteries which are also widely patent. Vertebral arteries are codominant without distal flow-limiting stenosis. Poor visualization of the RIGHT PICA origin. Poor visualization of the RIGHT PICA and AICA origin.  IMPRESSION: Multiple small, acute, nonhemorrhagic cerebral and cerebellar infarcts. Given the different vascular territories involved, these may represent a shower of emboli. Intracranial atherosclerotic disease as described, although no vertebral, basilar, or PCA disease of significance is observed. Electronically Signed   By: Staci Righter M.D.   On: 09/04/2017 13:59    Microbiology: No results found for this or any previous visit (from the past 240 hour(s)).   Labs: Basic Metabolic Panel: Recent Labs  Lab 09/03/17 2025 09/05/17 0439  NA 139 141  K 4.7 4.2  CL 103 103  CO2 27 28  GLUCOSE 207* 168*  BUN 43* 32*  CREATININE 1.33* 1.56*  CALCIUM 8.2* 8.1*  MG  --  1.2*   Liver Function Tests: Recent Labs  Lab 09/03/17 2025  AST 16  ALT 16*  ALKPHOS 100  BILITOT 0.8  PROT 5.9*  ALBUMIN 3.4*   Recent Labs  Lab 09/03/17 2025  LIPASE 32   No results for input(s): AMMONIA in the last 168 hours. CBC: Recent Labs  Lab 09/03/17 2025 09/05/17 0439  WBC 5.6 5.8  NEUTROABS 4.5  4.1  HGB 13.7 13.2  HCT 41.7 41.2  MCV 82.7 82.9  PLT 187 213   Cardiac Enzymes: Recent Labs  Lab 09/04/17 0406  TROPONINI 0.04*   BNP: BNP (last 3 results) No results for input(s): BNP in the last 8760 hours.  ProBNP (last 3 results) No results for input(s): PROBNP in the last 8760 hours.  CBG: Recent Labs  Lab 09/04/17 1129 09/04/17 1645 09/04/17 2103 09/05/17 0729 09/05/17 1213  GLUCAP 184* 176* 232* 167* 194*       Signed:  Alma Friendly, MD [facility] 09/05/2017, 3:33 PM

## 2017-09-05 NOTE — CV Procedure (Signed)
Attempted 2D Echo, patient is having breakfast, will try again at a later time.  Antonio Buchanan

## 2017-09-06 NOTE — Telephone Encounter (Signed)
Spoke with pt, aware of appointment in august.

## 2017-09-06 NOTE — Telephone Encounter (Signed)
Spoke with daughter and she stated she would rather speak with Suanne Marker on the telephone because her dad is weak. She refused the appointment. I will pass the message along.

## 2017-09-07 DIAGNOSIS — M4800 Spinal stenosis, site unspecified: Secondary | ICD-10-CM | POA: Diagnosis not present

## 2017-09-07 DIAGNOSIS — R296 Repeated falls: Secondary | ICD-10-CM | POA: Diagnosis not present

## 2017-09-07 DIAGNOSIS — I130 Hypertensive heart and chronic kidney disease with heart failure and stage 1 through stage 4 chronic kidney disease, or unspecified chronic kidney disease: Secondary | ICD-10-CM | POA: Diagnosis not present

## 2017-09-07 DIAGNOSIS — M353 Polymyalgia rheumatica: Secondary | ICD-10-CM | POA: Diagnosis not present

## 2017-09-07 DIAGNOSIS — K5790 Diverticulosis of intestine, part unspecified, without perforation or abscess without bleeding: Secondary | ICD-10-CM | POA: Diagnosis not present

## 2017-09-07 DIAGNOSIS — Z7982 Long term (current) use of aspirin: Secondary | ICD-10-CM | POA: Diagnosis not present

## 2017-09-07 DIAGNOSIS — I69398 Other sequelae of cerebral infarction: Secondary | ICD-10-CM | POA: Diagnosis not present

## 2017-09-07 DIAGNOSIS — E1140 Type 2 diabetes mellitus with diabetic neuropathy, unspecified: Secondary | ICD-10-CM | POA: Diagnosis not present

## 2017-09-07 DIAGNOSIS — Z7902 Long term (current) use of antithrombotics/antiplatelets: Secondary | ICD-10-CM | POA: Diagnosis not present

## 2017-09-07 DIAGNOSIS — Z7984 Long term (current) use of oral hypoglycemic drugs: Secondary | ICD-10-CM | POA: Diagnosis not present

## 2017-09-07 DIAGNOSIS — N183 Chronic kidney disease, stage 3 (moderate): Secondary | ICD-10-CM | POA: Diagnosis not present

## 2017-09-07 DIAGNOSIS — I5022 Chronic systolic (congestive) heart failure: Secondary | ICD-10-CM | POA: Diagnosis not present

## 2017-09-07 DIAGNOSIS — Z87891 Personal history of nicotine dependence: Secondary | ICD-10-CM | POA: Diagnosis not present

## 2017-09-07 DIAGNOSIS — I420 Dilated cardiomyopathy: Secondary | ICD-10-CM | POA: Diagnosis not present

## 2017-09-07 DIAGNOSIS — F4310 Post-traumatic stress disorder, unspecified: Secondary | ICD-10-CM | POA: Diagnosis not present

## 2017-09-07 DIAGNOSIS — R2689 Other abnormalities of gait and mobility: Secondary | ICD-10-CM | POA: Diagnosis not present

## 2017-09-07 DIAGNOSIS — E1122 Type 2 diabetes mellitus with diabetic chronic kidney disease: Secondary | ICD-10-CM | POA: Diagnosis not present

## 2017-09-07 DIAGNOSIS — E785 Hyperlipidemia, unspecified: Secondary | ICD-10-CM | POA: Diagnosis not present

## 2017-09-07 LAB — VITAMIN B1: Vitamin B1 (Thiamine): 122.9 nmol/L (ref 66.5–200.0)

## 2017-09-10 ENCOUNTER — Other Ambulatory Visit: Payer: Self-pay | Admitting: Rheumatology

## 2017-09-10 DIAGNOSIS — M1712 Unilateral primary osteoarthritis, left knee: Secondary | ICD-10-CM | POA: Insufficient documentation

## 2017-09-10 NOTE — Progress Notes (Deleted)
Office Visit Note  Patient: Antonio Buchanan             Date of Birth: February 19, 1922           MRN: [id number]             PCP: Leanna Battles, MD Referring: Leanna Battles, MD Visit Date: 09/14/2017 Occupation: @GUAROCC @    Subjective:  No chief complaint on file.   History of Present Illness: Antonio Buchanan is a 84 y.o. male return after his last visit in 03/2017.   Activities of Daily Living:  Patient reports morning stiffness for *** {minute/hour:19697}.   Patient {ACTIONS;DENIES/REPORTS:21021675::"Denies"} nocturnal pain.  Difficulty dressing/grooming: {ACTIONS;DENIES/REPORTS:21021675::"Denies"} Difficulty climbing stairs: {ACTIONS;DENIES/REPORTS:21021675::"Denies"} Difficulty getting out of chair: {ACTIONS;DENIES/REPORTS:21021675::"Denies"} Difficulty using hands for taps, buttons, cutlery, and/or writing: {ACTIONS;DENIES/REPORTS:21021675::"Denies"}   No Rheumatology ROS completed.   PMFS History:  Patient Active Problem List   Diagnosis Date Noted  . Primary osteoarthritis of left knee 09/10/2017  . Acute arterial ischemic stroke, vertebrobasilar, cerebellar ([place]) 09/04/2017  . Delirium 09/04/2017  . Ataxic gait 09/04/2017  . Polymyalgia rheumatica ([place]) 06/09/2016  . Spondylosis of lumbar region without myelopathy or radiculopathy 06/09/2016  . DDD (degenerative disc disease), cervical 06/09/2016  . Primary osteoarthritis of both hands 06/09/2016  . History of scoliosis 06/09/2016  . Venous insufficiency 06/09/2016  . History of thalassemia 06/09/2016  . History of trigeminal neuralgia 06/09/2016  . History of diabetes mellitus 06/09/2016  . Rash 09/08/2014  . ARF (acute renal failure) ([place]) 09/08/2014  . Venous stasis dermatitis of left lower extremity 09/08/2014  . Dehydration 09/08/2014  . Allergic reaction caused by a drug 09/08/2014  . Congestive dilated cardiomyopathy ([place]) 08/29/2014  . Chronic constipation 03/26/2012  . Aortic  stenosis 03/01/2012  . Chronic venous hypertension without complications 68/02/7516  . Murmur 02/24/2011  . Tobacco abuse 02/24/2011  . Diabetes mellitus ([place])   . HTN (hypertension)   . Hyperlipemia   . Spinal stenosis   . Thalassemia   . History of Lyme disease   . PTSD (post-traumatic stress disorder)   . GERD 04/06/2009  . NAUSEA 04/06/2009  . FLATULENCE-GAS-BLOATING 04/06/2009  . ABDOMINAL PAIN-EPIGASTRIC 04/06/2009    Past Medical History:  Diagnosis Date  . Anxiety   . Aortic stenosis   . Benign neoplasm of colon   . Cancer ([place])    left cheek  . Diabetes mellitus   . Diverticulosis of colon (without mention of hemorrhage) 02/03/2005  . Hemorrhoids 02/03/2005   Internal and external  . History of Lyme disease   . HTN (hypertension)   . Hyperlipemia   . Personal history of colonic polyps   . Phlebitis 06-24-2014   left leg  . Polymyalgia rheumatica ([place])   . PTSD (post-traumatic stress disorder)   . Spinal stenosis   . Thalassemia   . Varicose veins     Family History  Problem Relation Age of Onset  . Diabetes Father   . Leukemia Mother   . Hypertension Mother   . Arthritis Mother   . Diabetes Sister    Past Surgical History:  Procedure Laterality Date  . CATARACT EXTRACTION, BILATERAL    . HEMORROIDECTOMY    . KIDNEY STONE SURGERY     Social History   Social History Narrative  . Not on file     Objective: Vital Signs: There were no vitals taken for this visit.   Physical Exam   Musculoskeletal Exam: ***  CDAI Exam: No CDAI exam completed.  Investigation: No additional findings. CBC Latest Ref Rng & Units 09/05/2017 09/03/2017 09/10/2014  WBC 4.0 - 10.5 K/uL 5.8 5.6 7.2  Hemoglobin 13.0 - 17.0 g/dL 13.2 13.7 13.3  Hematocrit 39.0 - 52.0 % 41.2 41.7 41.5  Platelets 150 - 400 K/uL 213 187 228   CMP Latest Ref Rng & Units 09/05/2017 09/03/2017 07/21/2017  Glucose 65 - 99 mg/dL 168(H) 207(H) 124(H)  BUN 6 - 20 mg/dL 32(H) 43(H) 26    Creatinine 0.61 - 1.24 mg/dL 1.56(H) 1.33(H) 1.33(H)  Sodium 135 - 145 mmol/L 141 139 143  Potassium 3.5 - 5.1 mmol/L 4.2 4.7 4.8  Chloride 101 - 111 mmol/L 103 103 107(H)  CO2 22 - 32 mmol/L 28 27 19(L)  Calcium 8.9 - 10.3 mg/dL 8.1(L) 8.2(L) 7.3(L)  Total Protein 6.5 - 8.1 g/dL - 5.9(L) -  Total Bilirubin 0.3 - 1.2 mg/dL - 0.8 -  Alkaline Phos 38 - 126 U/L - 100 -  AST 15 - 41 U/L - 16 -  ALT 17 - 63 U/L - 16(L) -    Imaging: Dg Chest 2 View  Result Date: 09/03/2017 CLINICAL DATA:  84 y/o  M; frequent falls, change in mental status. EXAM: CHEST - 2 VIEW COMPARISON:  09/11/2014 chest radiograph. FINDINGS: Stable cardiac silhouette given projection and technique. Aortic atherosclerosis with calcification. Pulmonary venous hypertension and mild reticular opacities of the lungs. No consolidation. No pleural effusion or pneumothorax. No acute osseous abnormality is evident. IMPRESSION: Pulmonary venous hypertension and mild interstitial edema. No focal consolidation. Aortic atherosclerosis. Electronically Signed   By: Kristine Garbe M.D.   On: 09/03/2017 21:05   Ct Head Wo Contrast  Result Date: 09/03/2017 CLINICAL DATA:  Altered LOC, frequent falling EXAM: CT HEAD WITHOUT CONTRAST TECHNIQUE: Contiguous axial images were obtained from the base of the skull through the vertex without intravenous contrast. COMPARISON:  None. FINDINGS: Brain: No hemorrhage or intracranial mass is visualized. Age indeterminate hypodensity in the left cerebellum. Marked atrophy. Mild small vessel ischemic changes of the white matter. Slightly prominent ventricles, felt secondary to atrophy. Vascular: No hyperdense vessels.  Carotid vascular calcification. Skull: No fracture. Sinuses/Orbits: No acute finding. Other: None IMPRESSION: 1. Age indeterminate hypodensity/lacunar infarct in the left cerebellum. 2. Atrophy and mild small vessel ischemic changes of the white matter. Electronically Signed   By: Donavan Foil M.D.   On: 09/03/2017 21:27   Mr Brain Wo Contrast  Result Date: 09/04/2017 CLINICAL DATA:  Ataxia, stroke suspected. Frequent falls and loss of balance over the past 2 weeks. Congestive heart failure with dilated cardiomyopathy. Hypertension. EXAM: MRI HEAD WITHOUT CONTRAST MRA HEAD WITHOUT CONTRAST TECHNIQUE: Multiplanar, multiecho pulse sequences of the brain and surrounding structures were obtained without intravenous contrast. Angiographic images of the head were obtained using MRA technique without contrast. COMPARISON:  CT head 09/03/2017. FINDINGS: MRI HEAD FINDINGS Brain: In the LEFT inferior cerebellum, affecting cortex and white matter, is a slightly greater than 1 cm area of restricted diffusion, corresponding low ADC, correlating with the CT abnormality, consistent with an acute infarct. Additional smaller, subcentimeter acute infarcts are noted in the midline vermis, RIGHT occipital pole cortex, and medial RIGHT posterior parietal cortex. None are hemorrhagic. There is no mass lesion or extra-axial fluid. Hydrocephalus ex vacuo with dilated ventricles. Advanced atrophy. T2 and FLAIR hyperintensities in the white matter consistent with small vessel disease, not unexpected for age. Vascular: Reported separately. Skull and upper cervical spine: No tonsillar herniation. Normal marrow signal. Cervical spondylosis. Sinuses/Orbits: No  sinus opacity.  BILATERAL cataract extraction. Other: None. MRA HEAD FINDINGS Mild nonstenotic irregularity of the cavernous internal carotid arteries bilaterally. Possible 50% stenosis of the RIGHT M1 MCA at its origin and LEFT A1 ACA at its origin. Mild nonstenotic irregularity of both superior and inferior RIGHT M2 MCA branches. High-grade stenosis LEFT M2 superior MCA branch. Anterior communicating artery and distal anterior cerebral arteries patent. Mild to moderate irregularity of the MCA M3 branches bilaterally. Basilar artery widely patent and gives rise to  both posterior cerebral arteries which are also widely patent. Vertebral arteries are codominant without distal flow-limiting stenosis. Poor visualization of the RIGHT PICA origin. Poor visualization of the RIGHT PICA and AICA origin. IMPRESSION: Multiple small, acute, nonhemorrhagic cerebral and cerebellar infarcts. Given the different vascular territories involved, these may represent a shower of emboli. Intracranial atherosclerotic disease as described, although no vertebral, basilar, or PCA disease of significance is observed. Electronically Signed   By: Staci Righter M.D.   On: 09/04/2017 13:59   Mr Jodene Nam Head Wo Contrast  Result Date: 09/04/2017 CLINICAL DATA:  Ataxia, stroke suspected. Frequent falls and loss of balance over the past 2 weeks. Congestive heart failure with dilated cardiomyopathy. Hypertension. EXAM: MRI HEAD WITHOUT CONTRAST MRA HEAD WITHOUT CONTRAST TECHNIQUE: Multiplanar, multiecho pulse sequences of the brain and surrounding structures were obtained without intravenous contrast. Angiographic images of the head were obtained using MRA technique without contrast. COMPARISON:  CT head 09/03/2017. FINDINGS: MRI HEAD FINDINGS Brain: In the LEFT inferior cerebellum, affecting cortex and white matter, is a slightly greater than 1 cm area of restricted diffusion, corresponding low ADC, correlating with the CT abnormality, consistent with an acute infarct. Additional smaller, subcentimeter acute infarcts are noted in the midline vermis, RIGHT occipital pole cortex, and medial RIGHT posterior parietal cortex. None are hemorrhagic. There is no mass lesion or extra-axial fluid. Hydrocephalus ex vacuo with dilated ventricles. Advanced atrophy. T2 and FLAIR hyperintensities in the white matter consistent with small vessel disease, not unexpected for age. Vascular: Reported separately. Skull and upper cervical spine: No tonsillar herniation. Normal marrow signal. Cervical spondylosis. Sinuses/Orbits: No  sinus opacity.  BILATERAL cataract extraction. Other: None. MRA HEAD FINDINGS Mild nonstenotic irregularity of the cavernous internal carotid arteries bilaterally. Possible 50% stenosis of the RIGHT M1 MCA at its origin and LEFT A1 ACA at its origin. Mild nonstenotic irregularity of both superior and inferior RIGHT M2 MCA branches. High-grade stenosis LEFT M2 superior MCA branch. Anterior communicating artery and distal anterior cerebral arteries patent. Mild to moderate irregularity of the MCA M3 branches bilaterally. Basilar artery widely patent and gives rise to both posterior cerebral arteries which are also widely patent. Vertebral arteries are codominant without distal flow-limiting stenosis. Poor visualization of the RIGHT PICA origin. Poor visualization of the RIGHT PICA and AICA origin. IMPRESSION: Multiple small, acute, nonhemorrhagic cerebral and cerebellar infarcts. Given the different vascular territories involved, these may represent a shower of emboli. Intracranial atherosclerotic disease as described, although no vertebral, basilar, or PCA disease of significance is observed. Electronically Signed   By: Staci Righter M.D.   On: 09/04/2017 13:59    Speciality Comments: No specialty comments available.    Procedures:  No procedures performed Allergies: Cephalosporins and Lamisil af defense [tolnaftate]   Assessment / Plan:     Visit Diagnoses: Polymyalgia rheumatica ([place])  Primary osteoarthritis of both hands  fu by Dr. Mayer Camel  DDD (degenerative disc disease), cervical  DDD (degenerative disc disease), lumbar    Other medical conditions  are listed as follows:   History of thalassemia  History of scoliosis  History of diabetes mellitus  History of trigeminal neuralgia  History of gastroesophageal reflux (GERD)  History of hyperlipidemia  Venous insufficiency  History of Lyme disease - treated with IV ABX      Orders: No orders of the defined types were  placed in this encounter.  No orders of the defined types were placed in this encounter.   Face-to-face time spent with patient was *** minutes. Greater than 50% of time was spent in counseling and coordination of care.  Follow-Up Instructions: No follow-ups on file.   Bo Merino, MD  Note - This record has been created using Editor, commissioning.  Chart creation errors have been sought, but may not always  have been located. Such creation errors do not reflect on  the standard of medical care.

## 2017-09-11 DIAGNOSIS — I69398 Other sequelae of cerebral infarction: Secondary | ICD-10-CM | POA: Diagnosis not present

## 2017-09-11 DIAGNOSIS — I5022 Chronic systolic (congestive) heart failure: Secondary | ICD-10-CM | POA: Diagnosis not present

## 2017-09-11 DIAGNOSIS — R2689 Other abnormalities of gait and mobility: Secondary | ICD-10-CM | POA: Diagnosis not present

## 2017-09-11 DIAGNOSIS — E1140 Type 2 diabetes mellitus with diabetic neuropathy, unspecified: Secondary | ICD-10-CM | POA: Diagnosis not present

## 2017-09-11 DIAGNOSIS — I130 Hypertensive heart and chronic kidney disease with heart failure and stage 1 through stage 4 chronic kidney disease, or unspecified chronic kidney disease: Secondary | ICD-10-CM | POA: Diagnosis not present

## 2017-09-11 DIAGNOSIS — E1122 Type 2 diabetes mellitus with diabetic chronic kidney disease: Secondary | ICD-10-CM | POA: Diagnosis not present

## 2017-09-11 NOTE — Telephone Encounter (Signed)
Last Visit: 04/03/17 Next visit: 09/14/17  Okay to refill per Dr. Estanislado Pandy

## 2017-09-12 DIAGNOSIS — I69398 Other sequelae of cerebral infarction: Secondary | ICD-10-CM | POA: Diagnosis not present

## 2017-09-12 DIAGNOSIS — I5022 Chronic systolic (congestive) heart failure: Secondary | ICD-10-CM | POA: Diagnosis not present

## 2017-09-12 DIAGNOSIS — E1122 Type 2 diabetes mellitus with diabetic chronic kidney disease: Secondary | ICD-10-CM | POA: Diagnosis not present

## 2017-09-12 DIAGNOSIS — E1140 Type 2 diabetes mellitus with diabetic neuropathy, unspecified: Secondary | ICD-10-CM | POA: Diagnosis not present

## 2017-09-12 DIAGNOSIS — R2689 Other abnormalities of gait and mobility: Secondary | ICD-10-CM | POA: Diagnosis not present

## 2017-09-12 DIAGNOSIS — I130 Hypertensive heart and chronic kidney disease with heart failure and stage 1 through stage 4 chronic kidney disease, or unspecified chronic kidney disease: Secondary | ICD-10-CM | POA: Diagnosis not present

## 2017-09-14 ENCOUNTER — Ambulatory Visit: Payer: Medicare Other | Admitting: Rheumatology

## 2017-09-15 DIAGNOSIS — I10 Essential (primary) hypertension: Secondary | ICD-10-CM | POA: Diagnosis not present

## 2017-09-15 DIAGNOSIS — E1151 Type 2 diabetes mellitus with diabetic peripheral angiopathy without gangrene: Secondary | ICD-10-CM | POA: Diagnosis not present

## 2017-09-15 DIAGNOSIS — I509 Heart failure, unspecified: Secondary | ICD-10-CM | POA: Diagnosis not present

## 2017-09-15 DIAGNOSIS — E7849 Other hyperlipidemia: Secondary | ICD-10-CM | POA: Diagnosis not present

## 2017-09-15 DIAGNOSIS — I6389 Other cerebral infarction: Secondary | ICD-10-CM | POA: Diagnosis not present

## 2017-09-15 DIAGNOSIS — N183 Chronic kidney disease, stage 3 (moderate): Secondary | ICD-10-CM | POA: Diagnosis not present

## 2017-09-15 DIAGNOSIS — E538 Deficiency of other specified B group vitamins: Secondary | ICD-10-CM | POA: Diagnosis not present

## 2017-09-15 DIAGNOSIS — Z6826 Body mass index (BMI) 26.0-26.9, adult: Secondary | ICD-10-CM | POA: Diagnosis not present

## 2017-09-18 ENCOUNTER — Other Ambulatory Visit: Payer: Self-pay

## 2017-09-18 ENCOUNTER — Emergency Department (HOSPITAL_COMMUNITY): Payer: Medicare Other

## 2017-09-18 ENCOUNTER — Emergency Department (HOSPITAL_COMMUNITY)
Admission: EM | Admit: 2017-09-18 | Discharge: 2017-09-18 | Disposition: A | Discharge status: Home / Self Care | Payer: Medicare Other | Attending: Emergency Medicine | Admitting: Emergency Medicine

## 2017-09-18 ENCOUNTER — Encounter (HOSPITAL_COMMUNITY): Payer: Self-pay

## 2017-09-18 DIAGNOSIS — S2241XA Multiple fractures of ribs, right side, initial encounter for closed fracture: Secondary | ICD-10-CM | POA: Insufficient documentation

## 2017-09-18 DIAGNOSIS — I509 Heart failure, unspecified: Secondary | ICD-10-CM | POA: Insufficient documentation

## 2017-09-18 DIAGNOSIS — Y998 Other external cause status: Secondary | ICD-10-CM | POA: Insufficient documentation

## 2017-09-18 DIAGNOSIS — I110 Hypertensive heart disease with heart failure: Secondary | ICD-10-CM | POA: Diagnosis not present

## 2017-09-18 DIAGNOSIS — Z7982 Long term (current) use of aspirin: Secondary | ICD-10-CM | POA: Diagnosis not present

## 2017-09-18 DIAGNOSIS — E119 Type 2 diabetes mellitus without complications: Secondary | ICD-10-CM | POA: Insufficient documentation

## 2017-09-18 DIAGNOSIS — Z79899 Other long term (current) drug therapy: Secondary | ICD-10-CM | POA: Insufficient documentation

## 2017-09-18 DIAGNOSIS — S299XXA Unspecified injury of thorax, initial encounter: Secondary | ICD-10-CM | POA: Diagnosis present

## 2017-09-18 DIAGNOSIS — Z85828 Personal history of other malignant neoplasm of skin: Secondary | ICD-10-CM | POA: Diagnosis not present

## 2017-09-18 DIAGNOSIS — Y929 Unspecified place or not applicable: Secondary | ICD-10-CM | POA: Insufficient documentation

## 2017-09-18 DIAGNOSIS — W010XXA Fall on same level from slipping, tripping and stumbling without subsequent striking against object, initial encounter: Secondary | ICD-10-CM | POA: Diagnosis not present

## 2017-09-18 DIAGNOSIS — Z87891 Personal history of nicotine dependence: Secondary | ICD-10-CM | POA: Insufficient documentation

## 2017-09-18 DIAGNOSIS — R110 Nausea: Secondary | ICD-10-CM | POA: Diagnosis not present

## 2017-09-18 DIAGNOSIS — Y9389 Activity, other specified: Secondary | ICD-10-CM | POA: Diagnosis not present

## 2017-09-18 DIAGNOSIS — Z7984 Long term (current) use of oral hypoglycemic drugs: Secondary | ICD-10-CM | POA: Diagnosis not present

## 2017-09-18 DIAGNOSIS — Z7902 Long term (current) use of antithrombotics/antiplatelets: Secondary | ICD-10-CM | POA: Insufficient documentation

## 2017-09-18 DIAGNOSIS — R1011 Right upper quadrant pain: Secondary | ICD-10-CM | POA: Diagnosis not present

## 2017-09-18 DIAGNOSIS — Z8673 Personal history of transient ischemic attack (TIA), and cerebral infarction without residual deficits: Secondary | ICD-10-CM | POA: Insufficient documentation

## 2017-09-18 DIAGNOSIS — R109 Unspecified abdominal pain: Secondary | ICD-10-CM | POA: Insufficient documentation

## 2017-09-18 DIAGNOSIS — F419 Anxiety disorder, unspecified: Secondary | ICD-10-CM | POA: Insufficient documentation

## 2017-09-18 LAB — CBC WITH DIFFERENTIAL/PLATELET
BASOS ABS: 0 10*3/uL (ref 0.0–0.1)
BASOS PCT: 0 %
EOS ABS: 0.1 10*3/uL (ref 0.0–0.7)
Eosinophils Relative: 1 %
HCT: 43.9 % (ref 39.0–52.0)
Hemoglobin: 14.4 g/dL (ref 13.0–17.0)
LYMPHS ABS: 0.5 10*3/uL — AB (ref 0.7–4.0)
Lymphocytes Relative: 6 %
MCH: 27 pg (ref 26.0–34.0)
MCHC: 32.8 g/dL (ref 30.0–36.0)
MCV: 82.4 fL (ref 78.0–100.0)
Monocytes Absolute: 0.4 10*3/uL (ref 0.1–1.0)
Monocytes Relative: 5 %
Neutro Abs: 6.9 10*3/uL (ref 1.7–7.7)
Neutrophils Relative %: 88 %
PLATELETS: 195 10*3/uL (ref 150–400)
RBC: 5.33 MIL/uL (ref 4.22–5.81)
RDW: 15.9 % — ABNORMAL HIGH (ref 11.5–15.5)
WBC: 7.8 10*3/uL (ref 4.0–10.5)

## 2017-09-18 LAB — URINALYSIS, ROUTINE W REFLEX MICROSCOPIC
BILIRUBIN URINE: NEGATIVE
Glucose, UA: NEGATIVE mg/dL
KETONES UR: NEGATIVE mg/dL
LEUKOCYTES UA: NEGATIVE
NITRITE: NEGATIVE
Protein, ur: NEGATIVE mg/dL
SPECIFIC GRAVITY, URINE: 1.014 (ref 1.005–1.030)
pH: 5 (ref 5.0–8.0)

## 2017-09-18 LAB — TROPONIN I: Troponin I: 0.04 ng/mL (ref <0.03)

## 2017-09-18 LAB — LIPASE, BLOOD: LIPASE: 36 U/L (ref 11–51)

## 2017-09-18 LAB — COMPREHENSIVE METABOLIC PANEL
ALK PHOS: 136 U/L — AB (ref 38–126)
ALT: 12 U/L (ref 0–44)
ANION GAP: 9 (ref 5–15)
AST: 15 U/L (ref 15–41)
Albumin: 3.6 g/dL (ref 3.5–5.0)
BUN: 33 mg/dL — ABNORMAL HIGH (ref 8–23)
CALCIUM: 8.4 mg/dL — AB (ref 8.9–10.3)
CO2: 25 mmol/L (ref 22–32)
Chloride: 101 mmol/L (ref 98–111)
Creatinine, Ser: 1.17 mg/dL (ref 0.61–1.24)
GFR, EST AFRICAN AMERICAN: 60 mL/min — AB (ref >60)
GFR, EST NON AFRICAN AMERICAN: 51 mL/min — AB (ref >60)
Glucose, Bld: 192 mg/dL — ABNORMAL HIGH (ref 70–99)
Potassium: 4.8 mmol/L (ref 3.5–5.1)
SODIUM: 135 mmol/L (ref 135–145)
TOTAL PROTEIN: 6.5 g/dL (ref 6.5–8.1)
Total Bilirubin: 0.7 mg/dL (ref 0.3–1.2)

## 2017-09-18 LAB — BRAIN NATRIURETIC PEPTIDE: B NATRIURETIC PEPTIDE 5: 1913.5 pg/mL — AB (ref 0.0–100.0)

## 2017-09-18 MED ORDER — IOPAMIDOL (ISOVUE-300) INJECTION 61%
INTRAVENOUS | Status: AC
  Filled 2017-09-18: qty 100

## 2017-09-18 MED ORDER — SODIUM CHLORIDE 0.9 % IV SOLN: INTRAVENOUS | Status: DC

## 2017-09-18 MED ORDER — FUROSEMIDE 20 MG PO TABS: 20.0000 mg | ORAL_TABLET | Freq: Every day | ORAL | 0 refills | Status: AC

## 2017-09-18 MED ORDER — IOPAMIDOL (ISOVUE-300) INJECTION 61%
100.0000 mL | Freq: Once | INTRAVENOUS | Status: AC | PRN
  Administered 2017-09-18: 100 mL via INTRAVENOUS

## 2017-09-18 MED ORDER — ONDANSETRON HCL 4 MG/2ML IJ SOLN
4.0000 mg | Freq: Once | INTRAMUSCULAR | Status: AC
  Administered 2017-09-18: 4 mg via INTRAVENOUS
  Filled 2017-09-18: qty 2

## 2017-09-18 MED ORDER — FUROSEMIDE 10 MG/ML IJ SOLN
20.0000 mg | Freq: Once | INTRAMUSCULAR | Status: AC
  Administered 2017-09-18: 20 mg via INTRAVENOUS
  Filled 2017-09-18: qty 4

## 2017-09-18 MED ORDER — TRAZODONE HCL 50 MG PO TABS: 50.0000 mg | ORAL_TABLET | Freq: Every day | ORAL | 0 refills | Status: AC

## 2017-09-18 MED ORDER — SODIUM CHLORIDE 0.9 % IV BOLUS
500.0000 mL | Freq: Once | INTRAVENOUS | Status: AC
  Administered 2017-09-18: 500 mL via INTRAVENOUS

## 2017-09-18 NOTE — ED Triage Notes (Signed)
Walk in reports shaking and pain in right side abdomen/rib pain. Son states Pt had fall 4 weeks ago and c/o pain since. Has been seen here for same last week, Son states Pt Dx with mini strokes. Pt requested to go to hospital again for same rib pain 6/10

## 2017-09-18 NOTE — ED Provider Notes (Signed)
[facility] DEPT Provider Note   CSN: [id number] Arrival date & time: 09/18/17  4580     History   Chief Complaint Chief Complaint  Patient presents with  . Abdominal Pain    HPI Antonio Buchanan is a 84 y.o. male.  HPI Patient presents to the emergency room for evaluation of weakness, shaking, and pain in the right side of his abdomen.  Patient fell about 4 weeks ago.  Since that time he has had persistent symptoms.  Patient was recently in the hospital last week for his frequent falls.  Patient diagnosed with strokes.  Patient was discharged back home.  Patient son states in the last several days he has not been sleeping well he has been complaining of increasing pain on the right side.  Questionable complaints of shortness of breath.  No known swelling.  No fevers or chills.  No cough. Past Medical History:  Diagnosis Date  . Anxiety   . Aortic stenosis   . Benign neoplasm of colon   . Cancer ([place])    left cheek  . Diabetes mellitus   . Diverticulosis of colon (without mention of hemorrhage) 02/03/2005  . Hemorrhoids 02/03/2005   Internal and external  . History of Lyme disease   . HTN (hypertension)   . Hyperlipemia   . Personal history of colonic polyps   . Phlebitis 06-24-2014   left leg  . Polymyalgia rheumatica ([place])   . PTSD (post-traumatic stress disorder)   . Spinal stenosis   . Thalassemia   . Varicose veins     Patient Active Problem List   Diagnosis Date Noted  . Primary osteoarthritis of left knee 09/10/2017  . Acute arterial ischemic stroke, vertebrobasilar, cerebellar ([place]) 09/04/2017  . Delirium 09/04/2017  . Ataxic gait 09/04/2017  . Polymyalgia rheumatica ([place]) 06/09/2016  . Spondylosis of lumbar region without myelopathy or radiculopathy 06/09/2016  . DDD (degenerative disc disease), cervical 06/09/2016  . Primary osteoarthritis of both hands 06/09/2016  . History of scoliosis 06/09/2016  . Venous  insufficiency 06/09/2016  . History of thalassemia 06/09/2016  . History of trigeminal neuralgia 06/09/2016  . History of diabetes mellitus 06/09/2016  . Rash 09/08/2014  . ARF (acute renal failure) ([place]) 09/08/2014  . Venous stasis dermatitis of left lower extremity 09/08/2014  . Dehydration 09/08/2014  . Allergic reaction caused by a drug 09/08/2014  . Congestive dilated cardiomyopathy ([place]) 08/29/2014  . Chronic constipation 03/26/2012  . Aortic stenosis 03/01/2012  . Chronic venous hypertension without complications 99/83/3825  . Murmur 02/24/2011  . Tobacco abuse 02/24/2011  . Diabetes mellitus ([place])   . HTN (hypertension)   . Hyperlipemia   . Spinal stenosis   . Thalassemia   . History of Lyme disease   . PTSD (post-traumatic stress disorder)   . GERD 04/06/2009  . NAUSEA 04/06/2009  . FLATULENCE-GAS-BLOATING 04/06/2009  . ABDOMINAL PAIN-EPIGASTRIC 04/06/2009    Past Surgical History:  Procedure Laterality Date  . CATARACT EXTRACTION, BILATERAL    . HEMORROIDECTOMY    . KIDNEY STONE SURGERY          Home Medications    Prior to Admission medications   Medication Sig Start Date End Date Taking? Authorizing Provider  aspirin 325 MG tablet Take 1 tablet (325 mg total) by mouth daily for 21 days. 09/06/17 09/27/17 Yes Alma Friendly, MD  clopidogrel (PLAVIX) 75 MG tablet Take 1 tablet (75 mg total) by mouth daily for 21 days. 09/05/17 09/26/17 Yes Lesia Sago  J, MD  escitalopram (LEXAPRO) 20 MG tablet Take 20 mg by mouth daily.   Yes [provider]  glipiZIDE (GLUCOTROL) 5 MG tablet Take 5 mg by mouth daily before breakfast.    Yes [provider]  LORazepam (ATIVAN) 0.5 MG tablet Take 0.25 mg by mouth 2 (two) times daily.    Yes [provider]  Menaquinone-7 (VITAMIN K2 PO) Take 1 tablet by mouth at bedtime.    Yes [provider]  metFORMIN (GLUCOPHAGE) 500 MG tablet Take 250 mg by mouth 2 (two) times daily with a meal.     Yes [provider]  metoprolol succinate (TOPROL-XL) 25 MG 24 hr tablet Take 1 tablet (25 mg total) by mouth daily. Patient taking differently: Take 25 mg by mouth at bedtime.  09/05/17  Yes Alma Friendly, MD  Multiple Vitamins-Minerals (PRESERVISION AREDS 2 PO) Take 1 tablet by mouth 2 (two) times daily.   Yes [provider]  pantoprazole (PROTONIX) 40 MG tablet Take 1 tablet by mouth every morning. Patient taking differently: Take 40 mg by mouth daily.  06/21/17  Yes Esterwood, Amy S, PA-C  predniSONE (DELTASONE) 5 MG tablet TAKE 1 TABLET(5 MG) BY MOUTH DAILY WITH BREAKFAST Patient taking differently: TAKE 1 TABLET(5 MG) BY MOUTH twice a day with meal 09/11/17  Yes Deveshwar, Abel Presto, MD  sacubitril-valsartan (ENTRESTO) 24-26 MG Take 1 tablet by mouth 2 (two) times daily. Patient taking differently: Take 1 tablet by mouth daily.  08/07/17  Yes Lendon Colonel, NP  spironolactone (ALDACTONE) 25 MG tablet Take 0.5 tablets (12.5 mg total) by mouth daily. 08/07/17  Yes Lendon Colonel, NP  vitamin B-12 (CYANOCOBALAMIN) 1000 MCG tablet Take 1 tablet (1,000 mcg total) by mouth daily. 09/05/17  Yes Alma Friendly, MD  atorvastatin (LIPITOR) 40 MG tablet Take 1 tablet (40 mg total) by mouth daily. Patient not taking: Reported on 09/18/2017 09/05/17   Alma Friendly, MD    Family History Family History  Problem Relation Age of Onset  . Diabetes Father   . Leukemia Mother   . Hypertension Mother   . Arthritis Mother   . Diabetes Sister     Social History Social History   Tobacco Use  . Smoking status: Former Smoker    Years: 78.00    Types: Cigars  . Smokeless tobacco: Never Used  Substance Use Topics  . Alcohol use: No    Alcohol/week: 0.0 oz  . Drug use: No     Allergies   Cephalosporins and Lamisil af defense [tolnaftate]   Review of Systems Review of Systems  Constitutional: Negative for fever.  Respiratory: Negative for cough and  shortness of breath.   Cardiovascular: Negative for chest pain.  Gastrointestinal: Positive for abdominal pain.  Genitourinary: Negative for dysuria.  Musculoskeletal: Negative for back pain.  Neurological: Negative for seizures and numbness.  All other systems reviewed and are negative.    Physical Exam Updated Vital Signs BP 132/74   Pulse 68   Temp 99.3 F (37.4 C) (Oral)   Resp (!) 21   SpO2 97%   Physical Exam  Constitutional: No distress.  Elderly, frail  HENT:  Head: Normocephalic and atraumatic.  Right Ear: External ear normal.  Left Ear: External ear normal.  Eyes: Conjunctivae are normal. Right eye exhibits no discharge. Left eye exhibits no discharge. No scleral icterus.  Neck: Neck supple. No tracheal deviation present.  Cardiovascular: Normal rate, regular rhythm and intact distal pulses.  Murmur  heard.  Systolic murmur is present. No chest wall tenderness  Pulmonary/Chest: Effort normal and breath sounds normal. No stridor. No respiratory distress. He has no wheezes. He has no rales.  Abdominal: Soft. Bowel sounds are normal. He exhibits no distension. There is tenderness in the right upper quadrant. There is no rebound and no guarding. No hernia.  No bruising noted on the chest or abdomen  Musculoskeletal: He exhibits no edema or tenderness.  Neurological: He is alert. He has normal strength. No cranial nerve deficit (no facial droop, extraocular movements intact, no slurred speech) or sensory deficit. He exhibits normal muscle tone. He displays no seizure activity. Coordination normal.  Patient able to lift both arms and legs off the bed, no facial droop  Skin: Skin is warm and dry. No rash noted.  Psychiatric: He has a normal mood and affect.  Nursing note and vitals reviewed.    ED Treatments / Results  Labs (all labs ordered are listed, but only abnormal results are displayed) Labs Reviewed  COMPREHENSIVE METABOLIC PANEL - Abnormal; Notable for the  following components:      Result Value   Glucose, Bld 192 (*)    BUN 33 (*)    Calcium 8.4 (*)    Alkaline Phosphatase 136 (*)    GFR calc non Af Amer 51 (*)    GFR calc Af Amer 60 (*)    All other components within normal limits  CBC WITH DIFFERENTIAL/PLATELET - Abnormal; Notable for the following components:   RDW 15.9 (*)    Lymphs Abs 0.5 (*)    All other components within normal limits  URINALYSIS, ROUTINE W REFLEX MICROSCOPIC - Abnormal; Notable for the following components:   Hgb urine dipstick SMALL (*)    Bacteria, UA RARE (*)    All other components within normal limits  TROPONIN I - Abnormal; Notable for the following components:   Troponin I 0.04 (*)    All other components within normal limits  BRAIN NATRIURETIC PEPTIDE - Abnormal; Notable for the following components:   B Natriuretic Peptide 1,913.5 (*)    All other components within normal limits  LIPASE, BLOOD  QUANTIFERON-TB GOLD PLUS    EKG EKG Interpretation  Date/Time:  Monday September 18 2017 07:46:16 EDT Ventricular Rate:  76 PR Interval:    QRS Duration: 105 QT Interval:  389 QTC Calculation: 438 R Axis:   -25 Text Interpretation:  Sinus rhythm Supraventricular bigeminy Borderline prolonged PR interval LVH with secondary repolarization abnormality Anterior Q waves, possibly due to LVH No significant change since last tracing Confirmed by Dorie Rank [id number]) on 09/18/2017 8:57:24 AM   Radiology Ct Abdomen Pelvis W Contrast  Result Date: 09/18/2017 CLINICAL DATA:  Right upper quadrant abdominal pain and adjacent right rib pain. The patient has had the pain since he fell 4 weeks ago. EXAM: CT ABDOMEN AND PELVIS WITH CONTRAST TECHNIQUE: Multidetector CT imaging of the abdomen and pelvis was performed using the standard protocol following bolus administration of intravenous contrast. CONTRAST:  160mL ISOVUE-300 IOPAMIDOL (ISOVUE-300) INJECTION 61% COMPARISON:  Radiographs dated 09/18/2017 and CT scan of the  abdomen dated 09/12/2014 FINDINGS: Lower chest: There are subacute fractures of the posterior aspect of the right sixth and twelfth ribs and a subtle fracture of the anterolateral aspect of the right fifth rib. There are older appearing fractures of lateral aspects of the right eighth through eleventh ribs. Calcification in the aortic valve and mitral valve annulus. Aortic calcification. Overall heart size is normal.  No pericardial effusion. Slight bronchiectasis at the lung bases posteriorly. Hepatobiliary: There is a 7.4 cm cyst in the periphery of the right lobe of the liver, unchanged since the prior study. Liver parenchyma is otherwise normal. Biliary tree is normal. Pancreas: Unremarkable. No pancreatic ductal dilatation or surrounding inflammatory changes. Spleen: Normal in size without focal abnormality. Adrenals/Urinary Tract: Multiple small benign-appearing bilateral renal cysts. No hydronephrosis. Bladder is normal. Stomach/Bowel: Redundant but otherwise normal appearing colon. Terminal ileum and appendix are normal. Stomach and small bowel are normal. Vascular/Lymphatic: Aortic atherosclerosis. No enlarged abdominal or pelvic lymph nodes. Reproductive: Prostate is unremarkable. Other: No abdominal wall hernia or abnormality. No abdominopelvic ascites. Musculoskeletal: Old compression fracture of the inferior endplate of L1. Degenerative disc and joint disease in the lower lumbar spine. No acute abnormalities. IMPRESSION: 1. Probable subacute fractures of the right fifth, sixth, and twelfth ribs. Probable old fractures of the right eighth through eleventh ribs. 2. No acute abnormality of the abdomen or pelvis. 3.  Aortic Atherosclerosis (ICD10-I70.0). Electronically Signed   By: Lorriane Shire M.D.   On: 09/18/2017 11:54   Dg Abdomen Acute W/chest  Result Date: 09/18/2017 CLINICAL DATA:  Right upper quadrant pain.  Nausea. EXAM: DG ABDOMEN ACUTE W/ 1V CHEST COMPARISON:  Chest x-ray 09/03/2017.  CT  09/03/2017 FINDINGS: Mild cardiomegaly. Tortuosity of the thoracic aorta. Mild interstitial prominence again noted throughout the lungs. Perihilar opacities noted, left greater than right, favor asymmetric edema. No effusions or acute bony abnormality. IMPRESSION: Stable interstitial prominence. Mild perihilar opacities, left greater than right, favor asymmetric edema/CHF. Electronically Signed   By: Rolm Baptise M.D.   On: 09/18/2017 08:47    Procedures Procedures (including critical care time)  Medications Ordered in ED Medications  iopamidol (ISOVUE-300) 61 % injection (has no administration in time range)  furosemide (LASIX) injection 20 mg (has no administration in time range)  ondansetron (ZOFRAN) injection 4 mg (4 mg Intravenous Given 09/18/17 0930)  sodium chloride 0.9 % bolus 500 mL (0 mLs Intravenous Stopped 09/18/17 1033)  iopamidol (ISOVUE-300) 61 % injection 100 mL (100 mLs Intravenous Contrast Given 09/18/17 1121)     Initial Impression / Assessment and Plan / ED Course  I have reviewed the triage vital signs and the nursing notes.  Pertinent labs & imaging results that were available during my care of the patient were reviewed by me and considered in my medical decision making (see chart for details).  Clinical Course as of Sep 19 1342  Mon Sep 18, 2017  0813 Family requests PPD testing.  They are trying to get him in an assisted living facility. Will order quantiferon gold    [JK]  1104 Trop elevated at 0.04 but unchanged   [JK]  1104 CXR with questionable chf findings.  Pt not complaining of dyspnea.  Primarily states he is uncomfortable in the bed now and wants to go home   [JK]  1300 Patient has been able to get up and walk around to the bathroom.   [JK]  1341 BNP is elevated at 1913 and chest x-ray findings suggest component of congestive heart failure.   [JK]    Clinical Course User Index [JK] Dorie Rank, MD    Patient presented to the emergency room for  evaluation of right-sided abdominal pain.  Patient's ED work-up is notable for an elevated BNP and chest x-ray suggesting component of congestive heart failure.  CT abdomen pelvis was ordered to evaluate for his right abdominal pain.  Subacute rib fractures  noted, these are the likely cause of his rib pain associated with his recent falls.  I will consult the medical service for admission of his CHF exacerbation considering his age and comorbidities.  Final Clinical Impressions(s) / ED Diagnoses   Final diagnoses:  Acute on chronic congestive heart failure, unspecified heart failure type ([place])  Closed fracture of multiple ribs of right side, initial encounter     Dorie Rank, MD 09/18/17 1347

## 2017-09-18 NOTE — Progress Notes (Signed)
CSW spoke with patients son, Iona Beard [phone], regarding patients future ALF plans. Per son, patient has a confirmed bed at [facility] for Wednesday 7/3. Son states no barriers at this time to patient going to [facility] and facility only needs TB test. EDP has ordered TB test. CSW informed son patients labs were still pending at time of call and was unsure of disposition plan. If patient is medically cleared, patient will return home and plan to go to [facility] on Wednesday.   Kingsley Spittle, LCSW Emergency Room Clinical Social Worker [phone]

## 2017-09-18 NOTE — Consult Note (Signed)
Medical Consultation   Antonio Buchanan  OEU:[id number]  DOB: 01/22/1922   DOA: 09/18/2017 PCP: Leanna Battles, MD   Requesting physician: ED   Reason for consultation: CHF    History of Present Illness: Antonio Buchanan is an 84 y.o. male with past medical history of CHF, hypertension, recent CVA, PTSD and anxiety presented to the emergency department complaining of abdominal pain on the right side.  Patient report falling 4 weeks ago when he was diagnosed with CVA and was discharged back home.  Patient reported that he has not been sleeping well for the past several days and has been complaining of increasing upper right-sided abdominal pain.  Patient denies nausea, vomiting, diarrhea and constipation.  No other complaints at this time.  No chills or fever, no cough or shortness of breath, not leg swelling.  Denies chest pain  Review of Systems:  All others reviewed and are negative.    Past Medical History: Past Medical History:  Diagnosis Date  . Anxiety   . Aortic stenosis   . Benign neoplasm of colon   . Cancer ([place])    left cheek  . Diabetes mellitus   . Diverticulosis of colon (without mention of hemorrhage) 02/03/2005  . Hemorrhoids 02/03/2005   Internal and external  . History of Lyme disease   . HTN (hypertension)   . Hyperlipemia   . Personal history of colonic polyps   . Phlebitis 06-24-2014   left leg  . Polymyalgia rheumatica ([place])   . PTSD (post-traumatic stress disorder)   . Spinal stenosis   . Thalassemia   . Varicose veins     Past Surgical History: Past Surgical History:  Procedure Laterality Date  . CATARACT EXTRACTION, BILATERAL    . HEMORROIDECTOMY    . KIDNEY STONE SURGERY      Allergies:   Allergies  Allergen Reactions  . Cephalosporins Hives and Itching  . Lamisil Af Defense [Tolnaftate] Hives    Severe hives    Social History:  reports that he has quit smoking. His smoking use included cigars. He  quit after 78.00 years of use. He has never used smokeless tobacco. He reports that he does not drink alcohol or use drugs.   Family History: Family History  Problem Relation Age of Onset  . Diabetes Father   . Leukemia Mother   . Hypertension Mother   . Arthritis Mother   . Diabetes Sister     Physical Exam: Vitals:   09/18/17 1230 09/18/17 1244 09/18/17 1411 09/18/17 1519  BP: 132/74 132/74 (!) 144/84 138/88  Pulse:  68 72 77  Resp:  (!) 21 18 18   Temp:      TempSrc:      SpO2:  97% 98% 98%    Constitutional: Alert and awake, oriented x3, not in any acute distress. Eyes: PERLA, EOMI, irises appear normal, anicteric sclera,  ENMT: external ears and nose appear normal. Lips appears normal, oropharynx mucosa, tongue, posterior pharynx appear normal  Neck: neck appears normal, no masses, normal ROM, no thyromegaly, no JVD  CVS: S1-S2 clear, no murmur rubs or gallops, Trace b/l LE edema, normal pedal pulses  Respiratory:  clear to auscultation bilaterally, no wheezing, rales or rhonchi. Respiratory effort normal. No accessory muscle use.  Abdomen: soft nontender, nondistended, normal bowel sounds, no hepatosplenomegaly, no hernias  Musculoskeletal: : no cyanosis or clubbingy Neuro: Cranial nerves II-XII intact, strength, sensation, reflexes Psych: judgement and insight  appear normal, stable mood and affect, mental status Skin: no rashes or lesions or ulcers, no induration or nodules   Data reviewed:  I have personally reviewed following labs and imaging studies Labs:  CBC: Recent Labs  Lab 09/18/17 0903  WBC 7.8  NEUTROABS 6.9  HGB 14.4  HCT 43.9  MCV 82.4  PLT 161    Basic Metabolic Panel: Recent Labs  Lab 09/18/17 0903  NA 135  K 4.8  CL 101  CO2 25  GLUCOSE 192*  BUN 33*  CREATININE 1.17  CALCIUM 8.4*   GFR CrCl cannot be calculated (Unknown ideal weight.). Liver Function Tests: Recent Labs  Lab 09/18/17 0903  AST 15  ALT 12  ALKPHOS 136*    BILITOT 0.7  PROT 6.5  ALBUMIN 3.6   Recent Labs  Lab 09/18/17 0903  LIPASE 36   No results for input(s): AMMONIA in the last 168 hours. Coagulation profile No results for input(s): INR, PROTIME in the last 168 hours.  Cardiac Enzymes: Recent Labs  Lab 09/18/17 0903  TROPONINI 0.04*   BNP: Invalid input(s): POCBNP CBG: No results for input(s): GLUCAP in the last 168 hours. D-Dimer No results for input(s): DDIMER in the last 72 hours. Hgb A1c No results for input(s): HGBA1C in the last 72 hours. Lipid Profile No results for input(s): CHOL, HDL, LDLCALC, TRIG, CHOLHDL, LDLDIRECT in the last 72 hours. Thyroid function studies No results for input(s): TSH, T4TOTAL, T3FREE, THYROIDAB in the last 72 hours.  Invalid input(s): FREET3 Anemia work up No results for input(s): VITAMINB12, FOLATE, FERRITIN, TIBC, IRON, RETICCTPCT in the last 72 hours. Urinalysis    Component Value Date/Time   COLORURINE YELLOW 09/18/2017 0905   APPEARANCEUR CLEAR 09/18/2017 0905   LABSPEC 1.014 09/18/2017 0905   PHURINE 5.0 09/18/2017 0905   GLUCOSEU NEGATIVE 09/18/2017 0905   HGBUR SMALL (A) 09/18/2017 0905   BILIRUBINUR NEGATIVE 09/18/2017 0905   KETONESUR NEGATIVE 09/18/2017 0905   PROTEINUR NEGATIVE 09/18/2017 0905   UROBILINOGEN 0.2 09/08/2014 2011   NITRITE NEGATIVE 09/18/2017 0905   LEUKOCYTESUR NEGATIVE 09/18/2017 0905     Microbiology No results found for this or any previous visit (from the past 240 hour(s)).   Inpatient Medications:   Scheduled Meds: . iopamidol       Continuous Infusions:   Radiological Exams on Admission: Ct Abdomen Pelvis W Contrast  Result Date: 09/18/2017 CLINICAL DATA:  Right upper quadrant abdominal pain and adjacent right rib pain. The patient has had the pain since he fell 4 weeks ago. EXAM: CT ABDOMEN AND PELVIS WITH CONTRAST TECHNIQUE: Multidetector CT imaging of the abdomen and pelvis was performed using the standard protocol following  bolus administration of intravenous contrast. CONTRAST:  174mL ISOVUE-300 IOPAMIDOL (ISOVUE-300) INJECTION 61% COMPARISON:  Radiographs dated 09/18/2017 and CT scan of the abdomen dated 09/12/2014 FINDINGS: Lower chest: There are subacute fractures of the posterior aspect of the right sixth and twelfth ribs and a subtle fracture of the anterolateral aspect of the right fifth rib. There are older appearing fractures of lateral aspects of the right eighth through eleventh ribs. Calcification in the aortic valve and mitral valve annulus. Aortic calcification. Overall heart size is normal. No pericardial effusion. Slight bronchiectasis at the lung bases posteriorly. Hepatobiliary: There is a 7.4 cm cyst in the periphery of the right lobe of the liver, unchanged since the prior study. Liver parenchyma is otherwise normal. Biliary tree is normal. Pancreas: Unremarkable. No pancreatic ductal dilatation or surrounding inflammatory changes. Spleen: Normal  in size without focal abnormality. Adrenals/Urinary Tract: Multiple small benign-appearing bilateral renal cysts. No hydronephrosis. Bladder is normal. Stomach/Bowel: Redundant but otherwise normal appearing colon. Terminal ileum and appendix are normal. Stomach and small bowel are normal. Vascular/Lymphatic: Aortic atherosclerosis. No enlarged abdominal or pelvic lymph nodes. Reproductive: Prostate is unremarkable. Other: No abdominal wall hernia or abnormality. No abdominopelvic ascites. Musculoskeletal: Old compression fracture of the inferior endplate of L1. Degenerative disc and joint disease in the lower lumbar spine. No acute abnormalities. IMPRESSION: 1. Probable subacute fractures of the right fifth, sixth, and twelfth ribs. Probable old fractures of the right eighth through eleventh ribs. 2. No acute abnormality of the abdomen or pelvis. 3.  Aortic Atherosclerosis (ICD10-I70.0). Electronically Signed   By: Lorriane Shire M.D.   On: 09/18/2017 11:54   Dg Abdomen  Acute W/chest  Result Date: 09/18/2017 CLINICAL DATA:  Right upper quadrant pain.  Nausea. EXAM: DG ABDOMEN ACUTE W/ 1V CHEST COMPARISON:  Chest x-ray 09/03/2017.  CT 09/03/2017 FINDINGS: Mild cardiomegaly. Tortuosity of the thoracic aorta. Mild interstitial prominence again noted throughout the lungs. Perihilar opacities noted, left greater than right, favor asymmetric edema. No effusions or acute bony abnormality. IMPRESSION: Stable interstitial prominence. Mild perihilar opacities, left greater than right, favor asymmetric edema/CHF. Electronically Signed   By: Rolm Baptise M.D.   On: 09/18/2017 08:47   Impression/Recommendations: Chronic systolic CHF Recent echocardiogram 6/18 shows EF of 20% with diffuse hypokinesis.  Elevated BNP and some congestion on xray.  Patient clinically asymptomatic for CHF.  No dyspnea or requirement of oxygen, minimal lower extremity edema, lungs clear to auscultation and patient ambulatory without dyspnea.  Okay to treat with p.o. Lasix.  Per family members  Lasix was recently discontinued due to dehydration,  Therefore will treat with 20 mg Lasix p.o. daily for 7 days.  Advised to follow-up with cardiology or PCP as an outpatient in 1 week. Instruction to return to the emergency department given.    Subacute right rib fractures 5th, 6th and 12th  Rest, avoid heavy lifting.  This should heal in 8 weeks. Tylenol PRN for pain   Insomnia with PTSD Will add trazodone at bedtime  Patient deemed stable for discharge, has family support and working on admission to ALF on 7/3 pending TB test results.   Time Spent: 80 minutes discussing impressions, recommendation and treatment with patient and family.  50% of the time was spent face-to-face with patient.   Chipper Oman M.D. [facility]  09/18/2017, 5:16 PM  Pager please text page via  [email or web address] Password [email or web address]   Note - This record has been created using [employer]. Chart creation errors have been  sought, but may not always have been located. Such creation errors do not reflect on the standard of medical care.

## 2017-09-19 ENCOUNTER — Ambulatory Visit: Payer: Medicare Other | Admitting: Neurology

## 2017-09-21 DIAGNOSIS — I130 Hypertensive heart and chronic kidney disease with heart failure and stage 1 through stage 4 chronic kidney disease, or unspecified chronic kidney disease: Secondary | ICD-10-CM | POA: Diagnosis not present

## 2017-09-21 DIAGNOSIS — E1140 Type 2 diabetes mellitus with diabetic neuropathy, unspecified: Secondary | ICD-10-CM | POA: Diagnosis not present

## 2017-09-21 DIAGNOSIS — R2689 Other abnormalities of gait and mobility: Secondary | ICD-10-CM | POA: Diagnosis not present

## 2017-09-21 DIAGNOSIS — I69398 Other sequelae of cerebral infarction: Secondary | ICD-10-CM | POA: Diagnosis not present

## 2017-09-21 DIAGNOSIS — E1122 Type 2 diabetes mellitus with diabetic chronic kidney disease: Secondary | ICD-10-CM | POA: Diagnosis not present

## 2017-09-21 DIAGNOSIS — I5022 Chronic systolic (congestive) heart failure: Secondary | ICD-10-CM | POA: Diagnosis not present

## 2017-09-22 LAB — QUANTIFERON-TB GOLD PLUS (RQFGPL)
QUANTIFERON NIL VALUE: 1.28 [IU]/mL
QuantiFERON TB1 Ag Value: 1.41 IU/mL
QuantiFERON TB2 Ag Value: 1.03 IU/mL

## 2017-09-22 LAB — QUANTIFERON-TB GOLD PLUS: QUANTIFERON-TB GOLD PLUS: NEGATIVE

## 2017-09-25 DIAGNOSIS — I509 Heart failure, unspecified: Secondary | ICD-10-CM | POA: Diagnosis not present

## 2017-09-25 DIAGNOSIS — E538 Deficiency of other specified B group vitamins: Secondary | ICD-10-CM | POA: Diagnosis not present

## 2017-09-25 DIAGNOSIS — E1151 Type 2 diabetes mellitus with diabetic peripheral angiopathy without gangrene: Secondary | ICD-10-CM | POA: Diagnosis not present

## 2017-09-25 DIAGNOSIS — Z6826 Body mass index (BMI) 26.0-26.9, adult: Secondary | ICD-10-CM | POA: Diagnosis not present

## 2017-09-26 DIAGNOSIS — I130 Hypertensive heart and chronic kidney disease with heart failure and stage 1 through stage 4 chronic kidney disease, or unspecified chronic kidney disease: Secondary | ICD-10-CM | POA: Diagnosis not present

## 2017-09-26 DIAGNOSIS — E1122 Type 2 diabetes mellitus with diabetic chronic kidney disease: Secondary | ICD-10-CM | POA: Diagnosis not present

## 2017-09-26 DIAGNOSIS — R2689 Other abnormalities of gait and mobility: Secondary | ICD-10-CM | POA: Diagnosis not present

## 2017-09-26 DIAGNOSIS — I69398 Other sequelae of cerebral infarction: Secondary | ICD-10-CM | POA: Diagnosis not present

## 2017-09-26 DIAGNOSIS — E1140 Type 2 diabetes mellitus with diabetic neuropathy, unspecified: Secondary | ICD-10-CM | POA: Diagnosis not present

## 2017-09-26 DIAGNOSIS — I5022 Chronic systolic (congestive) heart failure: Secondary | ICD-10-CM | POA: Diagnosis not present

## 2017-09-28 DIAGNOSIS — I5022 Chronic systolic (congestive) heart failure: Secondary | ICD-10-CM | POA: Diagnosis not present

## 2017-09-28 DIAGNOSIS — R2689 Other abnormalities of gait and mobility: Secondary | ICD-10-CM | POA: Diagnosis not present

## 2017-09-28 DIAGNOSIS — E1140 Type 2 diabetes mellitus with diabetic neuropathy, unspecified: Secondary | ICD-10-CM | POA: Diagnosis not present

## 2017-09-28 DIAGNOSIS — I69398 Other sequelae of cerebral infarction: Secondary | ICD-10-CM | POA: Diagnosis not present

## 2017-09-28 DIAGNOSIS — E1122 Type 2 diabetes mellitus with diabetic chronic kidney disease: Secondary | ICD-10-CM | POA: Diagnosis not present

## 2017-09-28 DIAGNOSIS — I130 Hypertensive heart and chronic kidney disease with heart failure and stage 1 through stage 4 chronic kidney disease, or unspecified chronic kidney disease: Secondary | ICD-10-CM | POA: Diagnosis not present

## 2017-10-04 DIAGNOSIS — I130 Hypertensive heart and chronic kidney disease with heart failure and stage 1 through stage 4 chronic kidney disease, or unspecified chronic kidney disease: Secondary | ICD-10-CM | POA: Diagnosis not present

## 2017-10-04 DIAGNOSIS — R2689 Other abnormalities of gait and mobility: Secondary | ICD-10-CM | POA: Diagnosis not present

## 2017-10-04 DIAGNOSIS — I5022 Chronic systolic (congestive) heart failure: Secondary | ICD-10-CM | POA: Diagnosis not present

## 2017-10-04 DIAGNOSIS — E1122 Type 2 diabetes mellitus with diabetic chronic kidney disease: Secondary | ICD-10-CM | POA: Diagnosis not present

## 2017-10-04 DIAGNOSIS — I69398 Other sequelae of cerebral infarction: Secondary | ICD-10-CM | POA: Diagnosis not present

## 2017-10-04 DIAGNOSIS — E1140 Type 2 diabetes mellitus with diabetic neuropathy, unspecified: Secondary | ICD-10-CM | POA: Diagnosis not present

## 2017-10-06 ENCOUNTER — Encounter: Payer: Self-pay | Admitting: Neurology

## 2017-10-06 ENCOUNTER — Ambulatory Visit (INDEPENDENT_AMBULATORY_CARE_PROVIDER_SITE_OTHER): Payer: Medicare Other | Admitting: Neurology

## 2017-10-06 VITALS — BP 98/72 | HR 76 | Ht 66.0 in | Wt 155.6 lb

## 2017-10-06 DIAGNOSIS — I639 Cerebral infarction, unspecified: Secondary | ICD-10-CM

## 2017-10-06 DIAGNOSIS — E1122 Type 2 diabetes mellitus with diabetic chronic kidney disease: Secondary | ICD-10-CM | POA: Diagnosis not present

## 2017-10-06 DIAGNOSIS — I6310 Cerebral infarction due to embolism of unspecified precerebral artery: Secondary | ICD-10-CM

## 2017-10-06 DIAGNOSIS — I130 Hypertensive heart and chronic kidney disease with heart failure and stage 1 through stage 4 chronic kidney disease, or unspecified chronic kidney disease: Secondary | ICD-10-CM | POA: Diagnosis not present

## 2017-10-06 DIAGNOSIS — E1140 Type 2 diabetes mellitus with diabetic neuropathy, unspecified: Secondary | ICD-10-CM | POA: Diagnosis not present

## 2017-10-06 DIAGNOSIS — R2689 Other abnormalities of gait and mobility: Secondary | ICD-10-CM | POA: Diagnosis not present

## 2017-10-06 DIAGNOSIS — I5022 Chronic systolic (congestive) heart failure: Secondary | ICD-10-CM | POA: Diagnosis not present

## 2017-10-06 DIAGNOSIS — I69398 Other sequelae of cerebral infarction: Secondary | ICD-10-CM | POA: Diagnosis not present

## 2017-10-06 MED ORDER — ASPIRIN 81 MG PO TABS: 81.0000 mg | ORAL_TABLET | Freq: Every day | ORAL | 1 refills | Status: AC

## 2017-10-06 NOTE — Progress Notes (Signed)
[facility] [address] [place]. [place] [address] [phone]       OFFICE CONSULT NOTE  Antonio. Antonio Buchanan Date of Birth:  Oct 18, 1921 Medical Record Number:  [id number]   Referring MD: Dr.Aroor  Reason for Referral:  stroke HPI: Antonio Buchanan is a pleasant 84 year old Caucasian male seen today for initial office consultation note following recent hospital visit for stroke on 09/04/17. History is obtained from him, his caregiver from assisted living facility as well as his daughter whom I spoke to over the phone. I have personally reviewed his electronic medical records as well as imaging  Films. He was seen at [facility] long hospital on 6/17 519 with frequent falls and loss of balance for the preceding 2 weeks as well as hallucinations and altered mental status. He was found to be slightly hypoxic but his oxygen sats improved and complained of feeling fatigued. CT scan of the head showed possible subacute cerebellar stroke hence an MRI was obtained which I personally reviewed and showed small left inferior cerebellar cortex and white matter as well as smaller midline vermis, right occipital pole and right posterior parietal cortical nonhemorrhagic infarcts. MRA of the brain showed only mild atherosclerotic changes. High-grade stenosis of the left M2 superior branch only which is asymptomatic. Transthoracic echo showed ejection fraction of 20% with diffuse hypokinesis but no definite clot. Carotid ultrasound showed no significant obstructive stenosis. LDL cholesterol is elevated at 143 mg percent and hemoglobin A1c was 7.1. Patient was seen by consultation [place] and started on aspirin and Plavix which is tolerating well but does get easy bruising but no  major bleeding. He is now living in assisted living facility. Patient still has trouble with ambulation needs a walker or walks behind his wheelchair. He needs close one-person assist. He needs help with changing  his clothes but can feed himself. He has shown improvement in the last few weeks and is now continent mostly. His had no recent major falls of bleeding. Patient denies any prior history of strokes or TIAs or neurological problems. He is a retired Psychologist, counselling. He states he had an IQ of 180.  ROS:   14 system review of systems is positive for  decreased hearing, skin bruising, balance difficulties, falls, memory loss and all other systems negative  PMH:  Past Medical History:  Diagnosis Date  . Anxiety   . Aortic stenosis   . Benign neoplasm of colon   . Cancer ([place])    left cheek  . Diabetes mellitus   . Diverticulosis of colon (without mention of hemorrhage) 02/03/2005  . Hemorrhoids 02/03/2005   Internal and external  . History of Lyme disease   . HTN (hypertension)   . Hyperlipemia   . Personal history of colonic polyps   . Phlebitis 06-24-2014   left leg  . Polymyalgia rheumatica ([place])   . PTSD (post-traumatic stress disorder)   . Spinal stenosis   . Stroke ([place])   . Thalassemia   . Varicose veins     Social History:  Social History   Socioeconomic History  . Marital status: Married    Spouse name: Not on file  . Number of children: 2  . Years of education: Not on file  . Highest education level: Not on file  Occupational History  . Occupation: retired Wellsite geologist  . Financial resource strain: Not on file  . Food insecurity:    Worry: Not on file    Inability: Not on  file  . Transportation needs:    Medical: Not on file    Non-medical: Not on file  Tobacco Use  . Smoking status: Former Smoker    Years: 78.00    Types: Cigars  . Smokeless tobacco: Never Used  Substance and Sexual Activity  . Alcohol use: No    Alcohol/week: 0.0 oz  . Drug use: No  . Sexual activity: Not on file  Lifestyle  . Physical activity:    Days per week: Not on file    Minutes per session: Not on file  . Stress: Not on file  Relationships  . Social connections:      Talks on phone: Not on file    Gets together: Not on file    Attends religious service: Not on file    Active member of club or organization: Not on file    Attends meetings of clubs or organizations: Not on file    Relationship status: Not on file  . Intimate partner violence:    Fear of current or ex partner: Not on file    Emotionally abused: Not on file    Physically abused: Not on file    Forced sexual activity: Not on file  Other Topics Concern  . Not on file  Social History Narrative  . Not on file    Medications:   Current Outpatient Medications on File Prior to Visit  Medication Sig Dispense Refill  . escitalopram (LEXAPRO) 20 MG tablet Take 20 mg by mouth daily.    . furosemide (LASIX) 20 MG tablet Take 1 tablet (20 mg total) by mouth daily for 7 days. 7 tablet 0  . glipiZIDE (GLUCOTROL) 5 MG tablet Take 5 mg by mouth daily before breakfast.     . LORazepam (ATIVAN) 0.5 MG tablet Take 0.25 mg by mouth 2 (two) times daily.     . Menaquinone-7 (VITAMIN K2 PO) Take 1 tablet by mouth at bedtime.     . metFORMIN (GLUCOPHAGE) 500 MG tablet Take 250 mg by mouth 2 (two) times daily with a meal.     . metoprolol succinate (TOPROL-XL) 25 MG 24 hr tablet Take 1 tablet (25 mg total) by mouth daily. (Patient taking differently: Take 25 mg by mouth at bedtime. ) 30 tablet 0  . Multiple Vitamins-Minerals (PRESERVISION AREDS 2 PO) Take 1 tablet by mouth 2 (two) times daily.    . pantoprazole (PROTONIX) 40 MG tablet Take 1 tablet by mouth every morning. (Patient taking differently: Take 40 mg by mouth daily. ) 30 tablet 11  . predniSONE (DELTASONE) 5 MG tablet TAKE 1 TABLET(5 MG) BY MOUTH DAILY WITH BREAKFAST (Patient taking differently: TAKE 1 TABLET(5 MG) BY MOUTH twice a day with meal) 90 tablet 0  . sacubitril-valsartan (ENTRESTO) 24-26 MG Take 1 tablet by mouth 2 (two) times daily. (Patient taking differently: Take 1 tablet by mouth daily. ) 60 tablet 2  . spironolactone (ALDACTONE)  25 MG tablet Take 0.5 tablets (12.5 mg total) by mouth daily. 15 tablet 3  . traZODone (DESYREL) 50 MG tablet Take 1 tablet (50 mg total) by mouth at bedtime. 30 tablet 0  . vitamin B-12 (CYANOCOBALAMIN) 1000 MCG tablet Take 1 tablet (1,000 mcg total) by mouth daily. 30 tablet 0   No current facility-administered medications on file prior to visit.     Allergies:   Allergies  Allergen Reactions  . Cephalosporins Hives and Itching  . Lamisil Af Defense [Tolnaftate] Hives    Severe hives  Physical Exam General: Frail elderly Caucasian male seated, in no evident distress Head: head normocephalic and atraumatic.   Neck: supple with no carotid or supraclavicular bruits Cardiovascular: regular rate and rhythm, no murmurs Musculoskeletal: no deformity Skin:  no rash but multiple petechiae Vascular:  Normal pulses all extremities  Neurologic Exam Mental Status: Awake and fully alert. Oriented to place and time. Recent and remote memory diminished recall 2/3. Clock drawing 3/4. Able to name 9 animals with 4 legs.. Attention span, concentration and fund of knowledge slightly diminished. Mood and affect appropriate.  Cranial Nerves: Fundoscopic exam reveals sharp disc margins. Pupils equal, briskly reactive to light. Extraocular movements full without nystagmus. Visual fields full to confrontation. Hearing intact. Facial sensation intact. Face, tongue, palate moves normally and symmetrically.  Motor: Normal bulk and tone. Normal strength in all tested extremity muscles. Sensory.: intact to touch , pinprick , position and vibratory sensation.  Coordination: Rapid alternating movements normal in all extremities. Finger-to-nose and heel-to-shin performed accurately bilaterally. Gait and Station: Arises from chair with  difficulty. Stance is widebased. Gait  is unsteady and needs a walker. . Reflexes: 1+ and symmetric. Toes downgoing.   NIHSS  1 Modified Rankin  3  ASSESSMENT: 84 year old  Caucasian male small multiple cerebral and cerebellar infarcts of embolic etiology in June 2019 likely from his ischemic cardiomyopathy with ejection fraction of 20%. Patient has mild age related cognitive impairment, advanced age and is a high fall risk and hence is not a good long-term anticoagulation candidate     PLAN: I had a long d/w patient and his caregiver and his daughter ( whom I spoke to over the phone) about his recent embolic strokes, cardiomyopathy, risk for recurrent stroke/TIAs, personally independently reviewed imaging studies and stroke evaluation results and answered questions.Continue aspirin 81 mg daily  for secondary stroke prevention and discontinue Plavix as it has been more than 3 weeks since his stroke. I feel he is a high risk for bleeding due to advanced age, fall risk and cognitive impairment and hence we will not pursue anticoagulation. Recommend maintain strict control of hypertension with blood pressure goal below 130/90, diabetes with hemoglobin A1c goal below 6.5% and lipids with LDL cholesterol goal below 70 mg/dL. I also advised the patient to eat a healthy diet with plenty of whole grains, cereals, fruits and vegetables, exercise regularly and maintain ideal body weight. I also advised him fall and safety prevention precautions and advised him to use his walker at all times. Followup in the future with my nurse practitioner Antonio Buchanan in 3 months or call earlier if necessary. Greater than 50% time during this 45 minute consultation visit was spent on counseling and coordination of care about his embolic strokes, cardiomyopathy discussion about risk benefit of anticoagulation and answering questions. Antony Contras, MD  [facility] [address] [place], [place] [address]  Phone [phone] Fax [phone]  Note: This document was prepared with digital dictation and possible smart phrase technology. Any transcriptional errors that  result from this process are unintentional.

## 2017-10-06 NOTE — Patient Instructions (Addendum)
I had a long d/w patient and his caregiver about his recent embolic strokes, cardiomyopathy, risk for recurrent stroke/TIAs, personally independently reviewed imaging studies and stroke evaluation results and answered questions.Continue aspirin 81 mg daily  for secondary stroke prevention and discontinue Plavix as it has been more than 3 weeks since his stroke. I feel he is a high risk for bleeding due to advanced age, fall risk and cognitive impairment and hence we will not pursue anticoagulation. Recommend maintain strict control of hypertension with blood pressure goal below 130/90, diabetes with hemoglobin A1c goal below 6.5% and lipids with LDL cholesterol goal below 70 mg/dL. I also advised the patient to eat a healthy diet with plenty of whole grains, cereals, fruits and vegetables, exercise regularly and maintain ideal body weight. I also advised him fall and safety prevention precautions and advised him to use his walker at all times. Followup in the future with my nurse practitioner Janett Billow in 3 months or call earlier if necessary

## 2017-10-09 ENCOUNTER — Telehealth: Payer: Self-pay

## 2017-10-09 NOTE — Telephone Encounter (Signed)
Left vm for patient with Mrs. Ash LPn about receiving fax to sign orders for discontinue plavix,and start aspirin 81mg . RN left vm that a AVS ,and order form should of been with the pt and given to staff after last visit. Rn ask for call back for clarification.

## 2017-10-09 NOTE — Telephone Encounter (Signed)
Rn call  Mrs. Ash nurse at [facility]. Rn stated an order should of been attach with Md orders. Mrs Lia Foyer stated the facility did not know pt had an appt at our office until he came back with the avs. The family brought the pt to the appt without any paper work per the nurse. Mrs. Ash stated the family was explain to notify the nurse at [facility] when pt has appts .This is because an  order form, and appropriate paperwork can be given to MD to filled out. The facility cannot take the avs form to start ,and discontinue meds or orders. Rn stated the MD will sign the form. Mrs.Ash verbalized understanding.

## 2017-10-11 ENCOUNTER — Encounter (HOSPITAL_COMMUNITY): Payer: Self-pay | Admitting: Emergency Medicine

## 2017-10-11 ENCOUNTER — Emergency Department (HOSPITAL_COMMUNITY): Payer: Medicare Other

## 2017-10-11 ENCOUNTER — Other Ambulatory Visit: Payer: Self-pay

## 2017-10-11 ENCOUNTER — Emergency Department (HOSPITAL_COMMUNITY)
Admission: EM | Admit: 2017-10-11 | Discharge: 2017-10-11 | Disposition: A | Discharge status: Home / Self Care | Payer: Medicare Other | Attending: Emergency Medicine | Admitting: Emergency Medicine

## 2017-10-11 DIAGNOSIS — Z7982 Long term (current) use of aspirin: Secondary | ICD-10-CM | POA: Diagnosis not present

## 2017-10-11 DIAGNOSIS — W19XXXA Unspecified fall, initial encounter: Secondary | ICD-10-CM | POA: Diagnosis not present

## 2017-10-11 DIAGNOSIS — Z79899 Other long term (current) drug therapy: Secondary | ICD-10-CM | POA: Diagnosis not present

## 2017-10-11 DIAGNOSIS — M545 Low back pain, unspecified: Secondary | ICD-10-CM

## 2017-10-11 DIAGNOSIS — Z87891 Personal history of nicotine dependence: Secondary | ICD-10-CM | POA: Insufficient documentation

## 2017-10-11 DIAGNOSIS — E119 Type 2 diabetes mellitus without complications: Secondary | ICD-10-CM | POA: Insufficient documentation

## 2017-10-11 DIAGNOSIS — I10 Essential (primary) hypertension: Secondary | ICD-10-CM | POA: Diagnosis not present

## 2017-10-11 DIAGNOSIS — Z7984 Long term (current) use of oral hypoglycemic drugs: Secondary | ICD-10-CM | POA: Insufficient documentation

## 2017-10-11 DIAGNOSIS — M546 Pain in thoracic spine: Secondary | ICD-10-CM | POA: Diagnosis not present

## 2017-10-11 LAB — CBC
HEMATOCRIT: 41.2 % (ref 39.0–52.0)
Hemoglobin: 13.1 g/dL (ref 13.0–17.0)
MCH: 26.8 pg (ref 26.0–34.0)
MCHC: 31.8 g/dL (ref 30.0–36.0)
MCV: 84.3 fL (ref 78.0–100.0)
Platelets: 153 10*3/uL (ref 150–400)
RBC: 4.89 MIL/uL (ref 4.22–5.81)
RDW: 15.8 % — AB (ref 11.5–15.5)
WBC: 7.1 10*3/uL (ref 4.0–10.5)

## 2017-10-11 LAB — BASIC METABOLIC PANEL
ANION GAP: 9 (ref 5–15)
BUN: 37 mg/dL — ABNORMAL HIGH (ref 8–23)
CO2: 21 mmol/L — ABNORMAL LOW (ref 22–32)
Calcium: 8.4 mg/dL — ABNORMAL LOW (ref 8.9–10.3)
Chloride: 105 mmol/L (ref 98–111)
Creatinine, Ser: 1.34 mg/dL — ABNORMAL HIGH (ref 0.61–1.24)
GFR calc Af Amer: 51 mL/min — ABNORMAL LOW (ref >60)
GFR, EST NON AFRICAN AMERICAN: 44 mL/min — AB (ref >60)
Glucose, Bld: 135 mg/dL — ABNORMAL HIGH (ref 70–99)
POTASSIUM: 4.7 mmol/L (ref 3.5–5.1)
Sodium: 135 mmol/L (ref 135–145)

## 2017-10-11 MED ORDER — TRAMADOL HCL 50 MG PO TABS: 50.0000 mg | ORAL_TABLET | Freq: Four times a day (QID) | ORAL | 0 refills | Status: AC | PRN

## 2017-10-11 MED ORDER — HYDROCODONE-ACETAMINOPHEN 5-325 MG PO TABS
1.0000 | ORAL_TABLET | Freq: Once | ORAL | Status: AC
  Administered 2017-10-11: 1 via ORAL
  Filled 2017-10-11: qty 1

## 2017-10-11 MED ORDER — MORPHINE SULFATE (PF) 4 MG/ML IV SOLN: 4.0000 mg | Freq: Once | INTRAVENOUS | Status: DC

## 2017-10-11 NOTE — ED Provider Notes (Signed)
[facility] EMERGENCY DEPARTMENT Provider Note   CSN: [id number] Arrival date & time: 10/11/17  1414     History   Chief Complaint Chief Complaint  Patient presents with  . Fall    HPI Antonio Buchanan is a 84 y.o. male.  HPI Patient presents to the emergency room for evaluation of a recurrent fall.  Patient has been in and out of the hospital recently.  He was diagnosed with a stroke not too long ago.  He is also had some recurrent falls.  Last in the hospital on July 1 when he was admitted for acute on chronic congestive heart failure as well as several rib fractures.  The patient was back at his nursing facility.  He had another fall this morning where he just lost his balance and landed on his back.  He was able to get back into his bed but has had persistent pain in his lower back.  Denies hitting his head or losing consciousness.  He denies any headache. Past Medical History:  Diagnosis Date  . Anxiety   . Aortic stenosis   . Benign neoplasm of colon   . Cancer ([place])    left cheek  . Diabetes mellitus   . Diverticulosis of colon (without mention of hemorrhage) 02/03/2005  . Hemorrhoids 02/03/2005   Internal and external  . History of Lyme disease   . HTN (hypertension)   . Hyperlipemia   . Personal history of colonic polyps   . Phlebitis 06-24-2014   left leg  . Polymyalgia rheumatica ([place])   . PTSD (post-traumatic stress disorder)   . Spinal stenosis   . Stroke ([place])   . Thalassemia   . Varicose veins     Patient Active Problem List   Diagnosis Date Noted  . Acute on chronic congestive heart failure ([place])   . Multiple closed fractures of ribs of right side   . Primary osteoarthritis of left knee 09/10/2017  . Acute arterial ischemic stroke, vertebrobasilar, cerebellar ([place]) 09/04/2017  . Delirium 09/04/2017  . Ataxic gait 09/04/2017  . Polymyalgia rheumatica ([place]) 06/09/2016  . Spondylosis of lumbar region without myelopathy or  radiculopathy 06/09/2016  . DDD (degenerative disc disease), cervical 06/09/2016  . Primary osteoarthritis of both hands 06/09/2016  . History of scoliosis 06/09/2016  . Venous insufficiency 06/09/2016  . History of thalassemia 06/09/2016  . History of trigeminal neuralgia 06/09/2016  . History of diabetes mellitus 06/09/2016  . Rash 09/08/2014  . ARF (acute renal failure) ([place]) 09/08/2014  . Venous stasis dermatitis of left lower extremity 09/08/2014  . Dehydration 09/08/2014  . Allergic reaction caused by a drug 09/08/2014  . Congestive dilated cardiomyopathy ([place]) 08/29/2014  . Chronic constipation 03/26/2012  . Aortic stenosis 03/01/2012  . Chronic venous hypertension without complications 09/32/3557  . Murmur 02/24/2011  . Tobacco abuse 02/24/2011  . Diabetes mellitus ([place])   . HTN (hypertension)   . Hyperlipemia   . Spinal stenosis   . Thalassemia   . History of Lyme disease   . PTSD (post-traumatic stress disorder)   . GERD 04/06/2009  . NAUSEA 04/06/2009  . FLATULENCE-GAS-BLOATING 04/06/2009  . ABDOMINAL PAIN-EPIGASTRIC 04/06/2009    Past Surgical History:  Procedure Laterality Date  . CATARACT EXTRACTION, BILATERAL    . HEMORROIDECTOMY    . KIDNEY STONE SURGERY          Home Medications    Prior to Admission medications   Medication Sig Start Date End Date Taking? Authorizing Provider  aspirin 81 MG tablet Take 1 tablet (81 mg total) by mouth daily. 10/06/17   Garvin Fila, MD  escitalopram (LEXAPRO) 20 MG tablet Take 20 mg by mouth daily.    [provider]  furosemide (LASIX) 20 MG tablet Take 1 tablet (20 mg total) by mouth daily for 7 days. 09/18/17 10/06/17  Doreatha Lew, MD  glipiZIDE (GLUCOTROL) 5 MG tablet Take 5 mg by mouth daily before breakfast.     [provider]  LORazepam (ATIVAN) 0.5 MG tablet Take 0.25 mg by mouth 2 (two) times daily.     [provider]  Menaquinone-7 (VITAMIN K2 PO) Take 1 tablet by mouth  at bedtime.     [provider]  metFORMIN (GLUCOPHAGE) 500 MG tablet Take 250 mg by mouth 2 (two) times daily with a meal.     [provider]  metoprolol succinate (TOPROL-XL) 25 MG 24 hr tablet Take 1 tablet (25 mg total) by mouth daily. Patient taking differently: Take 25 mg by mouth at bedtime.  09/05/17   Alma Friendly, MD  Multiple Vitamins-Minerals (PRESERVISION AREDS 2 PO) Take 1 tablet by mouth 2 (two) times daily.    [provider]  pantoprazole (PROTONIX) 40 MG tablet Take 1 tablet by mouth every morning. Patient taking differently: Take 40 mg by mouth daily.  06/21/17   Esterwood, Amy S, PA-C  predniSONE (DELTASONE) 5 MG tablet TAKE 1 TABLET(5 MG) BY MOUTH DAILY WITH BREAKFAST Patient taking differently: TAKE 1 TABLET(5 MG) BY MOUTH twice a day with meal 09/11/17   Deveshwar, Abel Presto, MD  sacubitril-valsartan (ENTRESTO) 24-26 MG Take 1 tablet by mouth 2 (two) times daily. Patient taking differently: Take 1 tablet by mouth daily.  08/07/17   Lendon Colonel, NP  spironolactone (ALDACTONE) 25 MG tablet Take 0.5 tablets (12.5 mg total) by mouth daily. 08/07/17   Lendon Colonel, NP  traMADol (ULTRAM) 50 MG tablet Take 1 tablet (50 mg total) by mouth every 6 (six) hours as needed. 10/11/17   Dorie Rank, MD  traZODone (DESYREL) 50 MG tablet Take 1 tablet (50 mg total) by mouth at bedtime. 09/18/17   Doreatha Lew, MD  vitamin B-12 (CYANOCOBALAMIN) 1000 MCG tablet Take 1 tablet (1,000 mcg total) by mouth daily. 09/05/17   Alma Friendly, MD    Family History Family History  Problem Relation Age of Onset  . Diabetes Father   . Leukemia Mother   . Hypertension Mother   . Arthritis Mother   . Diabetes Sister     Social History Social History   Tobacco Use  . Smoking status: Former Smoker    Years: 78.00    Types: Cigars  . Smokeless tobacco: Never Used  Substance Use Topics  . Alcohol use: No    Alcohol/week: 0.0 oz  . Drug use: No      Allergies   Cephalosporins and Lamisil af defense [tolnaftate]   Review of Systems Review of Systems  All other systems reviewed and are negative.    Physical Exam Updated Vital Signs There were no vitals taken for this visit.  Physical Exam  Constitutional: No distress.  HENT:  Head: Normocephalic and atraumatic.  Right Ear: External ear normal.  Left Ear: External ear normal.  Eyes: Conjunctivae are normal. Right eye exhibits no discharge. Left eye exhibits no discharge. No scleral icterus.  Neck: Neck supple. No tracheal deviation present.  Cardiovascular: Normal rate, regular rhythm and intact distal pulses.  Pulmonary/Chest:  Effort normal and breath sounds normal. No stridor. No respiratory distress. He has no wheezes. He has no rales.  Abdominal: Soft. Bowel sounds are normal. He exhibits no distension. There is no tenderness. There is no rebound and no guarding.  Musculoskeletal: He exhibits no edema.       Cervical back: Normal.       Thoracic back: He exhibits tenderness.       Lumbar back: He exhibits tenderness.  Neurological: He is alert. He has normal strength. No cranial nerve deficit (no facial droop, extraocular movements intact, no slurred speech) or sensory deficit. He exhibits normal muscle tone. He displays no seizure activity. Coordination normal.  Skin: Skin is warm and dry. No rash noted. He is not diaphoretic.  Psychiatric: He has a normal mood and affect.  Nursing note and vitals reviewed.    ED Treatments / Results  Labs (all labs ordered are listed, but only abnormal results are displayed) Labs Reviewed  CBC - Abnormal; Notable for the following components:      Result Value   RDW 15.8 (*)    All other components within normal limits  BASIC METABOLIC PANEL - Abnormal; Notable for the following components:   CO2 21 (*)    Glucose, Bld 135 (*)    BUN 37 (*)    Creatinine, Ser 1.34 (*)    Calcium 8.4 (*)    GFR calc non Af Amer 44 (*)     GFR calc Af Amer 51 (*)    All other components within normal limits    EKG EKG Interpretation  Date/Time:  Wednesday October 11 2017 14:24:30 EDT Ventricular Rate:  74 PR Interval:  260 QRS Duration: 102 QT Interval:  402 QTC Calculation: 446 R Axis:   -26 Text Interpretation:  Sinus rhythm with 1st degree A-V block Left ventricular hypertrophy with repolarization abnormality Abnormal ECG lateral t wave changes are increased since last tracing Confirmed by Dorie Rank [phone]) on 10/11/2017 2:35:20 PM   Radiology Dg Thoracic Spine 2 View  Result Date: 10/11/2017 CLINICAL DATA:  Back pain secondary to a fall today. EXAM: THORACIC SPINE 2 VIEWS COMPARISON:  Chest x-ray dated 09/03/2017 FINDINGS: There is no evidence of thoracic spine fracture. Alignment is normal. Osteophytes in the lower thoracic spine. Aortic atherosclerosis. IMPRESSION: No acute abnormality of the thoracic spine. Aortic Atherosclerosis (ICD10-I70.0). Electronically Signed   By: Lorriane Shire M.D.   On: 10/11/2017 15:56   Dg Lumbar Spine Complete  Result Date: 10/11/2017 CLINICAL DATA:  Back pain secondary to a fall today. EXAM: LUMBAR SPINE - COMPLETE 4+ VIEW COMPARISON:  CT scan of the abdomen and pelvis dated 09/18/2017 FINDINGS: There is an old compression fracture of the inferior endplate of L1. Chronic degenerative disc disease at L3-4 and L4-5. Chronic grade 1 spondylolisthesis at L4-5 due to facet arthritis. Aortic atherosclerosis. IMPRESSION: No acute abnormality of the lumbar spine. Old compression fracture of the inferior aspect of L1. Electronically Signed   By: Lorriane Shire M.D.   On: 10/11/2017 15:58    Procedures Procedures (including critical care time)  Medications Ordered in ED Medications  HYDROcodone-acetaminophen (NORCO/VICODIN) 5-325 MG per tablet 1 tablet (1 tablet Oral Given 10/11/17 1613)     Initial Impression / Assessment and Plan / ED Course  I have reviewed the triage vital signs and  the nursing notes.  Pertinent labs & imaging results that were available during my care of the patient were reviewed by me and considered in my  medical decision making (see chart for details).  Clinical Course as of Oct 12 1807  Wed Oct 11, 2017  1654 Similar to previous results  Basic metabolic panel(!) [JK]  5621 CBC(!) [JK]  1654 Hemoglobin is stable  CBC(!) [JK]    Clinical Course User Index [JK] Dorie Rank, MD  Patient presented to the emergency room for evaluation of back pain following a fall.  Patient's had a stroke recently.  He has had some issues with his balance and coordination.  He denied any head injury today.  No loss of consciousness.  No neck pain.  He denied any trouble with fevers chills chest pain or shortness of breath.  His x-rays did not show any evidence of acute fracture.  His laboratory tests are unremarkable.  At this time there does not appear to be any evidence of an acute emergency medical condition and the patient appears stable for discharge with appropriate outpatient follow up.   Final Clinical Impressions(s) / ED Diagnoses   Final diagnoses:  Acute midline low back pain without sciatica  Fall, initial encounter    ED Discharge Orders        Ordered    traMADol (ULTRAM) 50 MG tablet  Every 6 hours PRN     10/11/17 1650       Dorie Rank, MD 10/12/17 [phone]

## 2017-10-11 NOTE — ED Notes (Signed)
ED Provider at bedside. 

## 2017-10-11 NOTE — ED Notes (Signed)
Son at bedside for transport back to facility. He was given DNR paperwork by this RN.

## 2017-10-11 NOTE — ED Notes (Signed)
Patient transported to X-ray 

## 2017-10-11 NOTE — ED Notes (Signed)
Urine sample at bedside

## 2017-10-11 NOTE — ED Triage Notes (Signed)
[facility] Pt reports falling onto his butt and crawled to get back in bed. Pain in lower back with hx of spinal stenosis. Staff at [facility] wanted him checked out. No LOC A/O. He is  Plavix. Vital Signs Stable.

## 2017-10-11 NOTE — Discharge Instructions (Addendum)
The medication as needed for pain, follow-up with your primary care doctor

## 2017-10-12 NOTE — Telephone Encounter (Signed)
Order form sign by DR.Sethi to discontinue plavix, and start aspirin 81 mg ongoing . Form fax twice to [phone] and confirmed.

## 2017-10-12 NOTE — Telephone Encounter (Signed)
Rn call [place] spoke to [employer].Ash(LPN). MRs. Ash stated she receive the fax for the order form sign by Dr.Sethi.

## 2017-10-28 ENCOUNTER — Other Ambulatory Visit: Payer: Self-pay

## 2017-10-28 ENCOUNTER — Encounter (HOSPITAL_COMMUNITY): Payer: Self-pay | Admitting: *Deleted

## 2017-10-28 ENCOUNTER — Emergency Department (HOSPITAL_COMMUNITY): Payer: Medicare Other

## 2017-10-28 ENCOUNTER — Emergency Department (HOSPITAL_COMMUNITY)
Admission: EM | Admit: 2017-10-28 | Discharge: 2017-10-28 | Disposition: A | Discharge status: Home / Self Care | Payer: Medicare Other | Attending: Emergency Medicine | Admitting: Emergency Medicine

## 2017-10-28 DIAGNOSIS — Y939 Activity, unspecified: Secondary | ICD-10-CM | POA: Insufficient documentation

## 2017-10-28 DIAGNOSIS — Z7984 Long term (current) use of oral hypoglycemic drugs: Secondary | ICD-10-CM | POA: Insufficient documentation

## 2017-10-28 DIAGNOSIS — Z79899 Other long term (current) drug therapy: Secondary | ICD-10-CM | POA: Diagnosis not present

## 2017-10-28 DIAGNOSIS — E119 Type 2 diabetes mellitus without complications: Secondary | ICD-10-CM | POA: Diagnosis not present

## 2017-10-28 DIAGNOSIS — I509 Heart failure, unspecified: Secondary | ICD-10-CM | POA: Diagnosis not present

## 2017-10-28 DIAGNOSIS — S32028A Other fracture of second lumbar vertebra, initial encounter for closed fracture: Secondary | ICD-10-CM | POA: Diagnosis not present

## 2017-10-28 DIAGNOSIS — Z87891 Personal history of nicotine dependence: Secondary | ICD-10-CM | POA: Insufficient documentation

## 2017-10-28 DIAGNOSIS — I110 Hypertensive heart disease with heart failure: Secondary | ICD-10-CM | POA: Diagnosis not present

## 2017-10-28 DIAGNOSIS — S3992XA Unspecified injury of lower back, initial encounter: Secondary | ICD-10-CM | POA: Diagnosis present

## 2017-10-28 DIAGNOSIS — Y92129 Unspecified place in nursing home as the place of occurrence of the external cause: Secondary | ICD-10-CM | POA: Insufficient documentation

## 2017-10-28 DIAGNOSIS — W19XXXA Unspecified fall, initial encounter: Secondary | ICD-10-CM | POA: Insufficient documentation

## 2017-10-28 DIAGNOSIS — S32020A Wedge compression fracture of second lumbar vertebra, initial encounter for closed fracture: Secondary | ICD-10-CM | POA: Insufficient documentation

## 2017-10-28 DIAGNOSIS — Z7982 Long term (current) use of aspirin: Secondary | ICD-10-CM | POA: Insufficient documentation

## 2017-10-28 DIAGNOSIS — Y999 Unspecified external cause status: Secondary | ICD-10-CM | POA: Diagnosis not present

## 2017-10-28 MED ORDER — HYDROCODONE-ACETAMINOPHEN 5-325 MG PO TABS: 0.5000 | ORAL_TABLET | Freq: Once | ORAL | Status: DC

## 2017-10-28 MED ORDER — HYDROCODONE-ACETAMINOPHEN 5-325 MG PO TABS
1.0000 | ORAL_TABLET | Freq: Once | ORAL | Status: AC
  Administered 2017-10-28: 1 via ORAL
  Filled 2017-10-28: qty 1

## 2017-10-28 MED ORDER — HYDROCODONE-ACETAMINOPHEN 5-325 MG PO TABS: 0.5000 | ORAL_TABLET | Freq: Four times a day (QID) | ORAL | 0 refills | Status: AC | PRN

## 2017-10-28 NOTE — ED Provider Notes (Signed)
[facility] DEPT Provider Note   CSN: [id number] Arrival date & time: 10/28/17  2120     History   Chief Complaint Chief Complaint  Patient presents with  . Back Pain    HPI Antonio Buchanan is a 84 y.o. male.  HPI  84 year old male presents with low back pain after a fall yesterday.  The patient does not provide much history except saying that he does remember falling and is having severe, intractable low back pain.  Daughter present at the bedside and the son is present via the phone and they both provide an extensive history.  It seems he is had waxing and waning mental status and they are concerned from a psychiatric standpoint as these had some depression as well as intermittent confusion for months.  They are worried this is a medication problem as its worse when he is been on the tramadol and he is also on chronic benzodiazepines.  Recently had his prednisone increased.  Currently he is doing okay except for worsening pain and is not sleeping well.  The patient denies any other pain such as chest pain or abdominal pain.  No headache.  No weakness in his extremities. Has frequent falls. Is due to see a new PCP at his facility in 5 days.  Past Medical History:  Diagnosis Date  . Anxiety   . Aortic stenosis   . Benign neoplasm of colon   . Cancer ([place])    left cheek  . Diabetes mellitus   . Diverticulosis of colon (without mention of hemorrhage) 02/03/2005  . Hemorrhoids 02/03/2005   Internal and external  . History of Lyme disease   . HTN (hypertension)   . Hyperlipemia   . Personal history of colonic polyps   . Phlebitis 06-24-2014   left leg  . Polymyalgia rheumatica ([place])   . PTSD (post-traumatic stress disorder)   . Spinal stenosis   . Stroke ([place])   . Thalassemia   . Varicose veins     Patient Active Problem List   Diagnosis Date Noted  . Acute on chronic congestive heart failure ([place])   . Multiple closed fractures of  ribs of right side   . Primary osteoarthritis of left knee 09/10/2017  . Acute arterial ischemic stroke, vertebrobasilar, cerebellar ([place]) 09/04/2017  . Delirium 09/04/2017  . Ataxic gait 09/04/2017  . Polymyalgia rheumatica ([place]) 06/09/2016  . Spondylosis of lumbar region without myelopathy or radiculopathy 06/09/2016  . DDD (degenerative disc disease), cervical 06/09/2016  . Primary osteoarthritis of both hands 06/09/2016  . History of scoliosis 06/09/2016  . Venous insufficiency 06/09/2016  . History of thalassemia 06/09/2016  . History of trigeminal neuralgia 06/09/2016  . History of diabetes mellitus 06/09/2016  . Rash 09/08/2014  . ARF (acute renal failure) ([place]) 09/08/2014  . Venous stasis dermatitis of left lower extremity 09/08/2014  . Dehydration 09/08/2014  . Allergic reaction caused by a drug 09/08/2014  . Congestive dilated cardiomyopathy ([place]) 08/29/2014  . Chronic constipation 03/26/2012  . Aortic stenosis 03/01/2012  . Chronic venous hypertension without complications 09/38/1829  . Murmur 02/24/2011  . Tobacco abuse 02/24/2011  . Diabetes mellitus ([place])   . HTN (hypertension)   . Hyperlipemia   . Spinal stenosis   . Thalassemia   . History of Lyme disease   . PTSD (post-traumatic stress disorder)   . GERD 04/06/2009  . NAUSEA 04/06/2009  . FLATULENCE-GAS-BLOATING 04/06/2009  . ABDOMINAL PAIN-EPIGASTRIC 04/06/2009    Past Surgical History:  Procedure  Laterality Date  . CATARACT EXTRACTION, BILATERAL    . HEMORROIDECTOMY    . KIDNEY STONE SURGERY          Home Medications    Prior to Admission medications   Medication Sig Start Date End Date Taking? Authorizing Provider  acetaminophen (TYLENOL) 500 MG tablet Take 500 mg by mouth 4 (four) times daily.   Yes [provider]  aspirin 81 MG tablet Take 1 tablet (81 mg total) by mouth daily. 10/06/17  Yes Garvin Fila, MD  escitalopram (LEXAPRO) 20 MG tablet Take 20 mg by mouth daily.   Yes  [provider]  glipiZIDE (GLUCOTROL) 5 MG tablet Take 5 mg by mouth daily before breakfast.    Yes [provider]  LORazepam (ATIVAN) 0.5 MG tablet Take 0.25 mg by mouth 2 (two) times daily.    Yes [provider]  magnesium oxide (MAG-OX) 400 MG tablet Take 400 mg by mouth daily.   Yes [provider]  Menthol, Topical Analgesic, (BIOFREEZE COLORLESS) 4 % GEL Apply 1 application topically 3 (three) times daily.   Yes [provider]  metFORMIN (GLUCOPHAGE) 500 MG tablet Take 250 mg by mouth daily with breakfast.    Yes [provider]  Multiple Vitamins-Minerals (PRESERVISION AREDS 2 PO) Take 1 tablet by mouth 2 (two) times daily.   Yes [provider]  pantoprazole (PROTONIX) 40 MG tablet Take 1 tablet by mouth every morning. Patient taking differently: Take 40 mg by mouth daily.  06/21/17  Yes Esterwood, Amy S, PA-C  predniSONE (DELTASONE) 5 MG tablet TAKE 1 TABLET(5 MG) BY MOUTH DAILY WITH BREAKFAST Patient taking differently: Take 15 mg by mouth daily with breakfast.  09/11/17  Yes Deveshwar, Abel Presto, MD  sacubitril-valsartan (ENTRESTO) 24-26 MG Take 1 tablet by mouth 2 (two) times daily. Patient taking differently: Take 1 tablet by mouth daily.  08/07/17  Yes Lendon Colonel, NP  spironolactone (ALDACTONE) 25 MG tablet Take 0.5 tablets (12.5 mg total) by mouth daily. 08/07/17  Yes Lendon Colonel, NP  vitamin B-12 (CYANOCOBALAMIN) 1000 MCG tablet Take 1 tablet (1,000 mcg total) by mouth daily. 09/05/17  Yes Alma Friendly, MD  furosemide (LASIX) 20 MG tablet Take 1 tablet (20 mg total) by mouth daily for 7 days. 09/18/17 10/06/17  Doreatha Lew, MD  HYDROcodone-acetaminophen (NORCO) 5-325 MG tablet Take 0.5-1 tablets by mouth every 6 (six) hours as needed. 10/28/17   Sherwood Gambler, MD  Menaquinone-7 (VITAMIN K2 PO) Take 1 tablet by mouth at bedtime.     [provider]  metoprolol succinate (TOPROL-XL) 25 MG  24 hr tablet Take 1 tablet (25 mg total) by mouth daily. Patient not taking: Reported on 10/28/2017 09/05/17   Alma Friendly, MD  traMADol (ULTRAM) 50 MG tablet Take 1 tablet (50 mg total) by mouth every 6 (six) hours as needed. Patient not taking: Reported on 10/28/2017 10/11/17   Dorie Rank, MD  traZODone (DESYREL) 50 MG tablet Take 1 tablet (50 mg total) by mouth at bedtime. Patient not taking: Reported on 10/28/2017 09/18/17   Doreatha Lew, MD    Family History Family History  Problem Relation Age of Onset  . Diabetes Father   . Leukemia Mother   . Hypertension Mother   . Arthritis Mother   . Diabetes Sister     Social History Social History   Tobacco Use  . Smoking status: Former Smoker    Years: 78.00    Types:  Cigars  . Smokeless tobacco: Never Used  Substance Use Topics  . Alcohol use: No    Alcohol/week: 0.0 standard drinks  . Drug use: No     Allergies   Cephalosporins and Lamisil af defense [tolnaftate]   Review of Systems Review of Systems  Musculoskeletal: Positive for back pain.  Neurological: Negative for weakness and numbness.     Physical Exam Updated Vital Signs BP (!) 141/87 (BP Location: Left Arm)   Pulse 85   Temp 97.7 F (36.5 C) (Oral)   Resp 18   SpO2 98%   Physical Exam  Constitutional: He appears well-developed and well-nourished. No distress.  HENT:  Head: Normocephalic and atraumatic.  Right Ear: External ear normal.  Left Ear: External ear normal.  Nose: Nose normal.  Eyes: Right eye exhibits no discharge. Left eye exhibits no discharge.  Neck: Neck supple.  Cardiovascular: Normal rate and regular rhythm.  Murmur heard. Pulmonary/Chest: Effort normal and breath sounds normal.  Abdominal: Soft. There is no tenderness.  Musculoskeletal: He exhibits no edema.       Lumbar back: He exhibits tenderness.  Neurological: He is alert.  Awake, alert, oriented to self. 5/5 strength in BLE. Normal gross sensation  Skin:  Skin is warm and dry. He is not diaphoretic.  Nursing note and vitals reviewed.    ED Treatments / Results  Labs (all labs ordered are listed, but only abnormal results are displayed) Labs Reviewed - No data to display  EKG None  Radiology Dg Lumbar Spine Complete  Result Date: 10/28/2017 CLINICAL DATA:  Fall. EXAM: LUMBAR SPINE - COMPLETE 4+ VIEW COMPARISON:  10/11/2017 FINDINGS: Chronic anterolisthesis of L4 on L5 is identified measuring 6 mm. Unchanged appearance of L1 compression deformity with loss of approximately 20% of the vertebral body height. There is a new compression fracture involving the superior endplate of L2 with loss of approximately 15% of the superior endplate. No additional fractures. Extensive calcified atherosclerotic disease of the abdominal aorta IMPRESSION: 1. Acute compression fracture involving the superior endplate of L2. 2. Chronic L1 compression deformity. 3.  Aortic Atherosclerosis (ICD10-I70.0). Electronically Signed   By: Kerby Moors M.D.   On: 10/28/2017 23:07    Procedures Procedures (including critical care time)  Medications Ordered in ED Medications  HYDROcodone-acetaminophen (NORCO/VICODIN) 5-325 MG per tablet 1 tablet (1 tablet Oral Given 10/28/17 2315)     Initial Impression / Assessment and Plan / ED Course  I have reviewed the triage vital signs and the nursing notes.  Pertinent labs & imaging results that were available during my care of the patient were reviewed by me and considered in my medical decision making (see chart for details).     X-ray shows acute compression fracture of the superior endplate of L2.  29% height loss.  There is no obvious retropulsion and he has no acute neuro complaints.  This would explain his worsening pain.  He was tried on hydrocodone after family agrees.  I do agree with stopping tramadol.  As for his waxing and waning mental problems for several months this is probably medication related in  combination with his recent stroke and likely some developing dementia.  He has PTSD as well.  However there does not appear to be a new acute issue from a mental status standpoint and he appears stable to be discharged home back to his facility.  Follow-up with the PCP this upcoming week.  Discharge with Norco as this seems to have helped.  Return  precautions. Discussed results with daughter.  Final Clinical Impressions(s) / ED Diagnoses   Final diagnoses:  Closed compression fracture of L2 lumbar vertebra, initial encounter [facility])    ED Discharge Orders         Ordered    HYDROcodone-acetaminophen (NORCO) 5-325 MG tablet  Every 6 hours PRN     10/28/17 2327           Sherwood Gambler, MD 10/28/17 2339

## 2017-10-28 NOTE — Discharge Instructions (Signed)
If the pain worsens or becomes unbearable, you develop weakness or numbness in your legs, incontinence, or any other new/concerning symptoms or return to the ER for evaluation.

## 2017-10-28 NOTE — ED Triage Notes (Signed)
Pt arrives via POV from [facility] with his son in [employer] [place]. Pt has had frequent falls from a stroke, last fall about 3 days ago. He c/o all over back pain.

## 2017-11-01 DIAGNOSIS — F0391 Unspecified dementia with behavioral disturbance: Secondary | ICD-10-CM | POA: Diagnosis not present

## 2017-11-01 DIAGNOSIS — E785 Hyperlipidemia, unspecified: Secondary | ICD-10-CM | POA: Diagnosis not present

## 2017-11-01 DIAGNOSIS — I509 Heart failure, unspecified: Secondary | ICD-10-CM | POA: Diagnosis not present

## 2017-11-01 DIAGNOSIS — I10 Essential (primary) hypertension: Secondary | ICD-10-CM | POA: Diagnosis not present

## 2017-11-01 DIAGNOSIS — Z8673 Personal history of transient ischemic attack (TIA), and cerebral infarction without residual deficits: Secondary | ICD-10-CM | POA: Diagnosis not present

## 2017-11-01 DIAGNOSIS — M353 Polymyalgia rheumatica: Secondary | ICD-10-CM | POA: Diagnosis not present

## 2017-11-01 DIAGNOSIS — F4310 Post-traumatic stress disorder, unspecified: Secondary | ICD-10-CM | POA: Diagnosis not present

## 2017-11-01 DIAGNOSIS — N183 Chronic kidney disease, stage 3 (moderate): Secondary | ICD-10-CM | POA: Diagnosis not present

## 2017-11-01 DIAGNOSIS — I420 Dilated cardiomyopathy: Secondary | ICD-10-CM | POA: Diagnosis not present

## 2017-11-01 DIAGNOSIS — E538 Deficiency of other specified B group vitamins: Secondary | ICD-10-CM | POA: Diagnosis not present

## 2017-11-01 DIAGNOSIS — E1122 Type 2 diabetes mellitus with diabetic chronic kidney disease: Secondary | ICD-10-CM | POA: Diagnosis not present

## 2017-11-01 DIAGNOSIS — I739 Peripheral vascular disease, unspecified: Secondary | ICD-10-CM | POA: Diagnosis not present

## 2017-11-02 ENCOUNTER — Encounter (HOSPITAL_COMMUNITY): Payer: Self-pay

## 2017-11-02 ENCOUNTER — Emergency Department (HOSPITAL_COMMUNITY)
Admission: EM | Admit: 2017-11-02 | Discharge: 2017-11-03 | Disposition: A | Discharge status: Home / Self Care | Payer: Medicare Other | Source: Home / Self Care | Attending: Emergency Medicine | Admitting: Emergency Medicine

## 2017-11-02 ENCOUNTER — Other Ambulatory Visit: Payer: Self-pay

## 2017-11-02 ENCOUNTER — Emergency Department (HOSPITAL_COMMUNITY): Payer: Medicare Other

## 2017-11-02 ENCOUNTER — Emergency Department (HOSPITAL_COMMUNITY)
Admission: EM | Admit: 2017-11-02 | Discharge: 2017-11-02 | Disposition: A | Discharge status: Home / Self Care | Payer: Medicare Other | Attending: Emergency Medicine | Admitting: Emergency Medicine

## 2017-11-02 DIAGNOSIS — Z8673 Personal history of transient ischemic attack (TIA), and cerebral infarction without residual deficits: Secondary | ICD-10-CM

## 2017-11-02 DIAGNOSIS — Z7984 Long term (current) use of oral hypoglycemic drugs: Secondary | ICD-10-CM | POA: Insufficient documentation

## 2017-11-02 DIAGNOSIS — R32 Unspecified urinary incontinence: Secondary | ICD-10-CM | POA: Diagnosis not present

## 2017-11-02 DIAGNOSIS — Z85828 Personal history of other malignant neoplasm of skin: Secondary | ICD-10-CM

## 2017-11-02 DIAGNOSIS — Z7982 Long term (current) use of aspirin: Secondary | ICD-10-CM

## 2017-11-02 DIAGNOSIS — R1084 Generalized abdominal pain: Secondary | ICD-10-CM | POA: Diagnosis not present

## 2017-11-02 DIAGNOSIS — I10 Essential (primary) hypertension: Secondary | ICD-10-CM

## 2017-11-02 DIAGNOSIS — E119 Type 2 diabetes mellitus without complications: Secondary | ICD-10-CM | POA: Insufficient documentation

## 2017-11-02 DIAGNOSIS — M2550 Pain in unspecified joint: Secondary | ICD-10-CM | POA: Diagnosis not present

## 2017-11-02 DIAGNOSIS — Z79899 Other long term (current) drug therapy: Secondary | ICD-10-CM | POA: Insufficient documentation

## 2017-11-02 DIAGNOSIS — S32020D Wedge compression fracture of second lumbar vertebra, subsequent encounter for fracture with routine healing: Secondary | ICD-10-CM | POA: Diagnosis not present

## 2017-11-02 DIAGNOSIS — T839XXA Unspecified complication of genitourinary prosthetic device, implant and graft, initial encounter: Secondary | ICD-10-CM

## 2017-11-02 DIAGNOSIS — Z87891 Personal history of nicotine dependence: Secondary | ICD-10-CM

## 2017-11-02 DIAGNOSIS — Y658 Other specified misadventures during surgical and medical care: Secondary | ICD-10-CM

## 2017-11-02 DIAGNOSIS — X58XXXA Exposure to other specified factors, initial encounter: Secondary | ICD-10-CM | POA: Diagnosis not present

## 2017-11-02 DIAGNOSIS — T83091A Other mechanical complication of indwelling urethral catheter, initial encounter: Secondary | ICD-10-CM | POA: Diagnosis not present

## 2017-11-02 DIAGNOSIS — Z7401 Bed confinement status: Secondary | ICD-10-CM | POA: Diagnosis not present

## 2017-11-02 DIAGNOSIS — M549 Dorsalgia, unspecified: Secondary | ICD-10-CM | POA: Diagnosis not present

## 2017-11-02 DIAGNOSIS — R1030 Lower abdominal pain, unspecified: Secondary | ICD-10-CM | POA: Diagnosis not present

## 2017-11-02 LAB — URINALYSIS, ROUTINE W REFLEX MICROSCOPIC
BILIRUBIN URINE: NEGATIVE
Bacteria, UA: NONE SEEN
Glucose, UA: NEGATIVE mg/dL
HGB URINE DIPSTICK: NEGATIVE
KETONES UR: 5 mg/dL — AB
Leukocytes, UA: NEGATIVE
NITRITE: NEGATIVE
PH: 7 (ref 5.0–8.0)
Protein, ur: 30 mg/dL — AB
SPECIFIC GRAVITY, URINE: 1.013 (ref 1.005–1.030)

## 2017-11-02 LAB — COMPREHENSIVE METABOLIC PANEL
ALK PHOS: 154 U/L — AB (ref 38–126)
ALT: 12 U/L (ref 0–44)
ANION GAP: 11 (ref 5–15)
AST: 15 U/L (ref 15–41)
Albumin: 3.8 g/dL (ref 3.5–5.0)
BILIRUBIN TOTAL: 1.1 mg/dL (ref 0.3–1.2)
BUN: 26 mg/dL — ABNORMAL HIGH (ref 8–23)
CALCIUM: 8.8 mg/dL — AB (ref 8.9–10.3)
CO2: 24 mmol/L (ref 22–32)
CREATININE: 1.11 mg/dL (ref 0.61–1.24)
Chloride: 103 mmol/L (ref 98–111)
GFR, EST NON AFRICAN AMERICAN: 55 mL/min — AB (ref >60)
Glucose, Bld: 228 mg/dL — ABNORMAL HIGH (ref 70–99)
Potassium: 4.5 mmol/L (ref 3.5–5.1)
Sodium: 138 mmol/L (ref 135–145)
TOTAL PROTEIN: 6.4 g/dL — AB (ref 6.5–8.1)

## 2017-11-02 LAB — CBC
HCT: 41.3 % (ref 39.0–52.0)
HEMOGLOBIN: 13.5 g/dL (ref 13.0–17.0)
MCH: 27.4 pg (ref 26.0–34.0)
MCHC: 32.7 g/dL (ref 30.0–36.0)
MCV: 83.8 fL (ref 78.0–100.0)
PLATELETS: 210 10*3/uL (ref 150–400)
RBC: 4.93 MIL/uL (ref 4.22–5.81)
RDW: 15.6 % — ABNORMAL HIGH (ref 11.5–15.5)
WBC: 6.7 10*3/uL (ref 4.0–10.5)

## 2017-11-02 LAB — LIPASE, BLOOD: Lipase: 33 U/L (ref 11–51)

## 2017-11-02 MED ORDER — HYDROCODONE-ACETAMINOPHEN 5-325 MG PO TABS
1.0000 | ORAL_TABLET | Freq: Once | ORAL | Status: AC
  Administered 2017-11-02: 1 via ORAL
  Filled 2017-11-02: qty 1

## 2017-11-02 NOTE — ED Triage Notes (Signed)
Pt received a catheter this week and when he got back to the facility he cut the catheter at the tip, pt had a fall injury earlier this week prior to receiving the catheter

## 2017-11-02 NOTE — ED Notes (Signed)
Bed: [facility]ALD Expected date:  Expected time:  Means of arrival:  Comments: 

## 2017-11-02 NOTE — ED Notes (Signed)
Patient transported to [facility] via [facility] with all belongings and paperwork, including DNR golden ticket.

## 2017-11-02 NOTE — ED Notes (Addendum)
[facility] called for patient transport back to [facility]. Patient to be transported back with indwelling foley catheter. Report called to Dee,RN at [facility].

## 2017-11-02 NOTE — ED Notes (Signed)
Bed: WA21 Expected date:  Expected time:  Means of arrival:  Comments: Unable to void

## 2017-11-02 NOTE — ED Provider Notes (Signed)
[facility] DEPT Provider Note   CSN: [id number] Arrival date & time: 11/02/17  1111     History   Chief Complaint Chief Complaint  Patient presents with  . Urinary symptoms  . Abdominal Pain    HPI Antonio Buchanan is a 84 y.o. male.  Patient is a 84 year old male with a history of aortic stenosis, diabetes, CHF, stroke, recurrent falls, polymyalgia rheumatica who is presenting today from nursing facility for difficulty urinating.  Patient was seen 5 days ago for back pain and found to have L2 compression fracture.  Patient here denies any abdominal pain but states it is just difficult to urinate and he is urinating on himself.  He does have back pain with moving but denies any chest pain or shortness of breath.  He denies any pain with urination he states he just cannot seem to get his stream going.  He notices that he is urinating on himself but does not always feel the urge to do so.  The history is provided by the patient and the nursing home.    Past Medical History:  Diagnosis Date  . Anxiety   . Aortic stenosis   . Benign neoplasm of colon   . Cancer ([place])    left cheek  . Diabetes mellitus   . Diverticulosis of colon (without mention of hemorrhage) 02/03/2005  . Hemorrhoids 02/03/2005   Internal and external  . History of Lyme disease   . HTN (hypertension)   . Hyperlipemia   . Personal history of colonic polyps   . Phlebitis 06-24-2014   left leg  . Polymyalgia rheumatica ([place])   . PTSD (post-traumatic stress disorder)   . Spinal stenosis   . Stroke ([place])   . Thalassemia   . Varicose veins     Patient Active Problem List   Diagnosis Date Noted  . Acute on chronic congestive heart failure ([place])   . Multiple closed fractures of ribs of right side   . Primary osteoarthritis of left knee 09/10/2017  . Acute arterial ischemic stroke, vertebrobasilar, cerebellar ([place]) 09/04/2017  . Delirium 09/04/2017  . Ataxic gait  09/04/2017  . Polymyalgia rheumatica ([place]) 06/09/2016  . Spondylosis of lumbar region without myelopathy or radiculopathy 06/09/2016  . DDD (degenerative disc disease), cervical 06/09/2016  . Primary osteoarthritis of both hands 06/09/2016  . History of scoliosis 06/09/2016  . Venous insufficiency 06/09/2016  . History of thalassemia 06/09/2016  . History of trigeminal neuralgia 06/09/2016  . History of diabetes mellitus 06/09/2016  . Rash 09/08/2014  . ARF (acute renal failure) ([place]) 09/08/2014  . Venous stasis dermatitis of left lower extremity 09/08/2014  . Dehydration 09/08/2014  . Allergic reaction caused by a drug 09/08/2014  . Congestive dilated cardiomyopathy ([place]) 08/29/2014  . Chronic constipation 03/26/2012  . Aortic stenosis 03/01/2012  . Chronic venous hypertension without complications 33/54/5625  . Murmur 02/24/2011  . Tobacco abuse 02/24/2011  . Diabetes mellitus ([place])   . HTN (hypertension)   . Hyperlipemia   . Spinal stenosis   . Thalassemia   . History of Lyme disease   . PTSD (post-traumatic stress disorder)   . GERD 04/06/2009  . NAUSEA 04/06/2009  . FLATULENCE-GAS-BLOATING 04/06/2009  . ABDOMINAL PAIN-EPIGASTRIC 04/06/2009    Past Surgical History:  Procedure Laterality Date  . CATARACT EXTRACTION, BILATERAL    . HEMORROIDECTOMY    . KIDNEY STONE SURGERY          Home Medications    Prior to Admission  medications   Medication Sig Start Date End Date Taking? Authorizing Provider  acetaminophen (TYLENOL) 500 MG tablet Take 500 mg by mouth 4 (four) times daily.    [provider]  aspirin 81 MG tablet Take 1 tablet (81 mg total) by mouth daily. 10/06/17   Garvin Fila, MD  escitalopram (LEXAPRO) 20 MG tablet Take 20 mg by mouth daily.    [provider]  furosemide (LASIX) 20 MG tablet Take 1 tablet (20 mg total) by mouth daily for 7 days. 09/18/17 10/06/17  Doreatha Lew, MD  glipiZIDE (GLUCOTROL) 5 MG tablet Take 5 mg by  mouth daily before breakfast.     [provider]  HYDROcodone-acetaminophen (NORCO) 5-325 MG tablet Take 0.5-1 tablets by mouth every 6 (six) hours as needed. 10/28/17   Sherwood Gambler, MD  LORazepam (ATIVAN) 0.5 MG tablet Take 0.25 mg by mouth 2 (two) times daily.     [provider]  magnesium oxide (MAG-OX) 400 MG tablet Take 400 mg by mouth daily.    [provider]  Menaquinone-7 (VITAMIN K2 PO) Take 1 tablet by mouth at bedtime.     [provider]  Menthol, Topical Analgesic, (BIOFREEZE COLORLESS) 4 % GEL Apply 1 application topically 3 (three) times daily.    [provider]  metFORMIN (GLUCOPHAGE) 500 MG tablet Take 250 mg by mouth daily with breakfast.     [provider]  metoprolol succinate (TOPROL-XL) 25 MG 24 hr tablet Take 1 tablet (25 mg total) by mouth daily. Patient not taking: Reported on 10/28/2017 09/05/17   Alma Friendly, MD  Multiple Vitamins-Minerals (PRESERVISION AREDS 2 PO) Take 1 tablet by mouth 2 (two) times daily.    [provider]  pantoprazole (PROTONIX) 40 MG tablet Take 1 tablet by mouth every morning. Patient taking differently: Take 40 mg by mouth daily.  06/21/17   Esterwood, Amy S, PA-C  predniSONE (DELTASONE) 5 MG tablet TAKE 1 TABLET(5 MG) BY MOUTH DAILY WITH BREAKFAST Patient taking differently: Take 15 mg by mouth daily with breakfast.  09/11/17   Bo Merino, MD  sacubitril-valsartan (ENTRESTO) 24-26 MG Take 1 tablet by mouth 2 (two) times daily. Patient taking differently: Take 1 tablet by mouth daily.  08/07/17   Lendon Colonel, NP  spironolactone (ALDACTONE) 25 MG tablet Take 0.5 tablets (12.5 mg total) by mouth daily. 08/07/17   Lendon Colonel, NP  traMADol (ULTRAM) 50 MG tablet Take 1 tablet (50 mg total) by mouth every 6 (six) hours as needed. Patient not taking: Reported on 10/28/2017 10/11/17   Dorie Rank, MD  traZODone (DESYREL) 50 MG tablet Take 1 tablet (50 mg total)  by mouth at bedtime. Patient not taking: Reported on 10/28/2017 09/18/17   Patrecia Pour, Christean Grief, MD  vitamin B-12 (CYANOCOBALAMIN) 1000 MCG tablet Take 1 tablet (1,000 mcg total) by mouth daily. 09/05/17   Alma Friendly, MD    Family History Family History  Problem Relation Age of Onset  . Diabetes Father   . Leukemia Mother   . Hypertension Mother   . Arthritis Mother   . Diabetes Sister     Social History Social History   Tobacco Use  . Smoking status: Former Smoker    Years: 78.00    Types: Cigars  . Smokeless tobacco: Never Used  Substance Use Topics  . Alcohol use: No    Alcohol/week: 0.0 standard drinks  . Drug use: No     Allergies   Cephalosporins  and Lamisil af defense [tolnaftate]   Review of Systems Review of Systems  All other systems reviewed and are negative.    Physical Exam Updated Vital Signs BP 128/82   Pulse 69   Temp 97.7 F (36.5 C) (Oral)   Resp 18   Wt 70.6 kg   SpO2 97%   BMI 25.12 kg/m   Physical Exam  Constitutional: He is oriented to person, place, and time. He appears well-developed and well-nourished. No distress.  HENT:  Head: Normocephalic and atraumatic.  Mouth/Throat: Oropharynx is clear and moist.  Eyes: Pupils are equal, round, and reactive to light. Conjunctivae and EOM are normal.  Neck: Normal range of motion. Neck supple.  Cardiovascular: Normal rate, regular rhythm and intact distal pulses.  Murmur heard.  Systolic murmur is present with a grade of 2/6. Pulmonary/Chest: Effort normal and breath sounds normal. No respiratory distress. He has no wheezes. He has no rales.  Abdominal: Soft. He exhibits no distension. There is no tenderness. There is no rebound and no guarding.  No tenderness of the abdomen even to deep palpation  Genitourinary: Penis normal.  Genitourinary Comments: Normal rectal tone  Musculoskeletal: Normal range of motion. He exhibits no edema.       Lumbar back: He exhibits tenderness and  bony tenderness.       Back:  Neurological: He is alert and oriented to person, place, and time.  5/5 strength in bilateral lower ext with sensation intact  Skin: Skin is warm and dry. No rash noted. No erythema.  Psychiatric: He has a normal mood and affect. His behavior is normal.  Nursing note and vitals reviewed.    ED Treatments / Results  Labs (all labs ordered are listed, but only abnormal results are displayed) Labs Reviewed  COMPREHENSIVE METABOLIC PANEL - Abnormal; Notable for the following components:      Result Value   Glucose, Bld 228 (*)    BUN 26 (*)    Calcium 8.8 (*)    Total Protein 6.4 (*)    Alkaline Phosphatase 154 (*)    GFR calc non Af Amer 55 (*)    All other components within normal limits  CBC - Abnormal; Notable for the following components:   RDW 15.6 (*)    All other components within normal limits  URINALYSIS, ROUTINE W REFLEX MICROSCOPIC - Abnormal; Notable for the following components:   Ketones, ur 5 (*)    Protein, ur 30 (*)    All other components within normal limits  LIPASE, BLOOD    EKG None  Radiology Ct Lumbar Spine Wo Contrast  Result Date: 11/02/2017 CLINICAL DATA:  Urinary retention.  Abdominal pain.  Back pain. EXAM: CT LUMBAR SPINE WITHOUT CONTRAST TECHNIQUE: Multidetector CT imaging of the lumbar spine was performed without intravenous contrast administration. Multiplanar CT image reconstructions were also generated. COMPARISON:  Radiography 10/28/2017.  CT 09/18/2017. FINDINGS: Segmentation: 5 lumbar type vertebral bodies. Alignment: 4 mm anterolisthesis L4-5. Vertebrae: Acute superior endplate fracture at L2 with loss of height of 20% as shown by recent radiography. Posterior bowing of the posterosuperior margin of the vertebral body by distance of about 4 mm. Old inferior endplate fracture at L1 without evidence of progression. Paraspinal and other soft tissues: Aortic atherosclerosis. Nonobstructing right renal calculus. Disc  levels: T11-12 and T12-L1: Normal. L1-2: Mild bulging of the disc.  No compressive stenosis. L2-3: Mild bulging of the disc.  No compressive stenosis. L3-4: Mild bulging of the disc. Facet and ligamentous hypertrophy.  Mild multifactorial stenosis. L4-5: Facet arthropathy with 4 mm of anterolisthesis. Bulging of the disc. Moderate multifactorial stenosis could cause neural compression. L5-S1: Mild bulging of the disc. Mild facet degeneration. No stenosis. IMPRESSION: Recent superior endplate fracture at L2 with loss of height of 20%. Superior endplate retropulsion of 4 mm, not likely causing neural compression. This could certainly be a cause of back pain. No evidence of progression since the recent radiographs. Old inferior endplate fracture at L1 without evidence of recent extension. Degenerative changes throughout the lumbar region. Multifactorial stenosis at the L4-5 level could cause neural compression on either or both sides. Electronically Signed   By: Nelson Chimes M.D.   On: 11/02/2017 12:43    Procedures Procedures (including critical care time)  Medications Ordered in ED Medications - No data to display   Initial Impression / Assessment and Plan / ED Course  I have reviewed the triage vital signs and the nursing notes.  Pertinent labs & imaging results that were available during my care of the patient were reviewed by me and considered in my medical decision making (see chart for details).     Elderly male with multiple falls recently diagnosed with an L2 compression fracture presenting today with urinary complaints.  He is dribbling and leaking on himself but denies dysuria.  Patient has normal rectal tone but concern for possible cord issue given recent fracture.  Patient cannot have an MRI because he has shrapnel in his hand.  Will start with CT of the lumbar spine.  Foley catheter was placed.  UA sent to ensure no signs of infection.  CBC, CMP also pending  1:07 PM Labs are  reassuring.  UA wnl.  CT shows the endplate fracture with 28% height loss but no evidence of of cord compression.  Patient's urinary symptoms may be related to enlarged prostate.  Foley catheter was placed and 500 mL's of urine was drained.  Will discharge back to facility with catheter and follow-up with urology or PCP in 1 week for catheter removal.  Final Clinical Impressions(s) / ED Diagnoses   Final diagnoses:  Urinary incontinence, unspecified type  Closed compression fracture of L2 lumbar vertebra with routine healing, subsequent encounter    ED Discharge Orders    None       Blanchie Dessert, MD 11/02/17 1310

## 2017-11-02 NOTE — ED Triage Notes (Signed)
Patient BIB EMS from [facility] living with complaints of urinary retention, with some urinary incontinence. Patient reports lower abdominal pain with radiation to his lower back. Pt VSS for EMS. Patient reports "Donnald Garre been here 3x for this! I should get a Mudlogger!"

## 2017-11-03 NOTE — ED Notes (Signed)
Pt. Able to void in a urinal and noted with 48ml of  urine , yellow/cloudy colored urine.

## 2017-11-03 NOTE — ED Provider Notes (Signed)
[facility] DEPT Provider Note   CSN: [id number] Arrival date & time: 11/02/17  2126     History   Chief Complaint No chief complaint on file.   HPI Antonio Buchanan is a 84 y.o. male.  HPI Patient is a 84 year old male who recently had a catheter placed for acute urinary retention.  He has some memory disturbance and tonight cut his catheter off.  The remainder of the catheter was removed when he presented to triage.  He has no complaints at this time.  He has been able to urinate since arriving here in the emergency department.  His bladder scan demonstrates 80 cc of urine.  This is the first time he had ever had a catheter placed for urinary retention.  Patient would prefer that another catheter not be placed.  His catheter was placed in the emergency department yesterday   Past Medical History:  Diagnosis Date  . Anxiety   . Aortic stenosis   . Benign neoplasm of colon   . Cancer ([place])    left cheek  . Diabetes mellitus   . Diverticulosis of colon (without mention of hemorrhage) 02/03/2005  . Hemorrhoids 02/03/2005   Internal and external  . History of Lyme disease   . HTN (hypertension)   . Hyperlipemia   . Personal history of colonic polyps   . Phlebitis 06-24-2014   left leg  . Polymyalgia rheumatica ([place])   . PTSD (post-traumatic stress disorder)   . Spinal stenosis   . Stroke ([place])   . Thalassemia   . Varicose veins     Patient Active Problem List   Diagnosis Date Noted  . Acute on chronic congestive heart failure ([place])   . Multiple closed fractures of ribs of right side   . Primary osteoarthritis of left knee 09/10/2017  . Acute arterial ischemic stroke, vertebrobasilar, cerebellar ([place]) 09/04/2017  . Delirium 09/04/2017  . Ataxic gait 09/04/2017  . Polymyalgia rheumatica ([place]) 06/09/2016  . Spondylosis of lumbar region without myelopathy or radiculopathy 06/09/2016  . DDD (degenerative disc disease), cervical  06/09/2016  . Primary osteoarthritis of both hands 06/09/2016  . History of scoliosis 06/09/2016  . Venous insufficiency 06/09/2016  . History of thalassemia 06/09/2016  . History of trigeminal neuralgia 06/09/2016  . History of diabetes mellitus 06/09/2016  . Rash 09/08/2014  . ARF (acute renal failure) ([place]) 09/08/2014  . Venous stasis dermatitis of left lower extremity 09/08/2014  . Dehydration 09/08/2014  . Allergic reaction caused by a drug 09/08/2014  . Congestive dilated cardiomyopathy ([place]) 08/29/2014  . Chronic constipation 03/26/2012  . Aortic stenosis 03/01/2012  . Chronic venous hypertension without complications 27/09/8673  . Murmur 02/24/2011  . Tobacco abuse 02/24/2011  . Diabetes mellitus ([place])   . HTN (hypertension)   . Hyperlipemia   . Spinal stenosis   . Thalassemia   . History of Lyme disease   . PTSD (post-traumatic stress disorder)   . GERD 04/06/2009  . NAUSEA 04/06/2009  . FLATULENCE-GAS-BLOATING 04/06/2009  . ABDOMINAL PAIN-EPIGASTRIC 04/06/2009    Past Surgical History:  Procedure Laterality Date  . CATARACT EXTRACTION, BILATERAL    . HEMORROIDECTOMY    . KIDNEY STONE SURGERY          Home Medications    Prior to Admission medications   Medication Sig Start Date End Date Taking? Authorizing Provider  acetaminophen (TYLENOL) 500 MG tablet Take 500 mg by mouth 4 (four) times daily.    [provider]  aspirin  81 MG tablet Take 1 tablet (81 mg total) by mouth daily. 10/06/17   Garvin Fila, MD  escitalopram (LEXAPRO) 20 MG tablet Take 20 mg by mouth daily.    [provider]  furosemide (LASIX) 20 MG tablet Take 1 tablet (20 mg total) by mouth daily for 7 days. 09/18/17 10/06/17  Doreatha Lew, MD  glipiZIDE (GLUCOTROL) 5 MG tablet Take 5 mg by mouth daily before breakfast.     [provider]  HYDROcodone-acetaminophen (NORCO) 5-325 MG tablet Take 0.5-1 tablets by mouth every 6 (six) hours as needed. 10/28/17    Sherwood Gambler, MD  LORazepam (ATIVAN) 0.5 MG tablet Take 0.25 mg by mouth 2 (two) times daily.     [provider]  magnesium oxide (MAG-OX) 400 MG tablet Take 400 mg by mouth daily.    [provider]  Menthol, Topical Analgesic, (BIOFREEZE COLORLESS) 4 % GEL Apply 1 application topically 3 (three) times daily.    [provider]  metFORMIN (GLUCOPHAGE) 500 MG tablet Take 250 mg by mouth daily with breakfast.     [provider]  metoprolol succinate (TOPROL-XL) 25 MG 24 hr tablet Take 1 tablet (25 mg total) by mouth daily. Patient not taking: Reported on 10/28/2017 09/05/17   Alma Friendly, MD  Multiple Vitamins-Minerals (PRESERVISION AREDS 2 PO) Take 1 tablet by mouth 2 (two) times daily.    [provider]  pantoprazole (PROTONIX) 40 MG tablet Take 1 tablet by mouth every morning. Patient taking differently: Take 40 mg by mouth daily.  06/21/17   Esterwood, Amy S, PA-C  predniSONE (DELTASONE) 5 MG tablet TAKE 1 TABLET(5 MG) BY MOUTH DAILY WITH BREAKFAST Patient taking differently: Take 15 mg by mouth daily with breakfast.  09/11/17   Bo Merino, MD  sacubitril-valsartan (ENTRESTO) 24-26 MG Take 1 tablet by mouth 2 (two) times daily. Patient taking differently: Take 1 tablet by mouth daily.  08/07/17   Lendon Colonel, NP  spironolactone (ALDACTONE) 25 MG tablet Take 0.5 tablets (12.5 mg total) by mouth daily. 08/07/17   Lendon Colonel, NP  traMADol (ULTRAM) 50 MG tablet Take 1 tablet (50 mg total) by mouth every 6 (six) hours as needed. Patient not taking: Reported on 10/28/2017 10/11/17   Dorie Rank, MD  traZODone (DESYREL) 50 MG tablet Take 1 tablet (50 mg total) by mouth at bedtime. Patient not taking: Reported on 10/28/2017 09/18/17   Patrecia Pour, Christean Grief, MD  vitamin B-12 (CYANOCOBALAMIN) 1000 MCG tablet Take 1 tablet (1,000 mcg total) by mouth daily. 09/05/17   Alma Friendly, MD    Family History Family History  Problem  Relation Age of Onset  . Diabetes Father   . Leukemia Mother   . Hypertension Mother   . Arthritis Mother   . Diabetes Sister     Social History Social History   Tobacco Use  . Smoking status: Former Smoker    Years: 78.00    Types: Cigars  . Smokeless tobacco: Never Used  Substance Use Topics  . Alcohol use: No    Alcohol/week: 0.0 standard drinks  . Drug use: No     Allergies   Cephalosporins and Lamisil af defense [tolnaftate]   Review of Systems Review of Systems  All other systems reviewed and are negative.    Physical Exam Updated Vital Signs BP (!) 146/78 (BP Location: Left Arm)   Pulse 77   Temp 98.2 F (36.8 C) (Oral)   Resp 17  SpO2 98%   Physical Exam  Constitutional: He is oriented to person, place, and time. He appears well-developed and well-nourished.  HENT:  Head: Normocephalic.  Eyes: EOM are normal.  Neck: Normal range of motion.  Pulmonary/Chest: Effort normal.  Abdominal: He exhibits no distension.  Genitourinary:  Genitourinary Comments: Normal-appearing penis.  No blood at the urethral meatus  Musculoskeletal: Normal range of motion.  Neurological: He is alert and oriented to person, place, and time.  Psychiatric: He has a normal mood and affect.  Nursing note and vitals reviewed.    ED Treatments / Results  Labs (all labs ordered are listed, but only abnormal results are displayed) Labs Reviewed - No data to display  EKG None  Radiology Ct Lumbar Spine Wo Contrast  Result Date: 11/02/2017 CLINICAL DATA:  Urinary retention.  Abdominal pain.  Back pain. EXAM: CT LUMBAR SPINE WITHOUT CONTRAST TECHNIQUE: Multidetector CT imaging of the lumbar spine was performed without intravenous contrast administration. Multiplanar CT image reconstructions were also generated. COMPARISON:  Radiography 10/28/2017.  CT 09/18/2017. FINDINGS: Segmentation: 5 lumbar type vertebral bodies. Alignment: 4 mm anterolisthesis L4-5. Vertebrae: Acute  superior endplate fracture at L2 with loss of height of 20% as shown by recent radiography. Posterior bowing of the posterosuperior margin of the vertebral body by distance of about 4 mm. Old inferior endplate fracture at L1 without evidence of progression. Paraspinal and other soft tissues: Aortic atherosclerosis. Nonobstructing right renal calculus. Disc levels: T11-12 and T12-L1: Normal. L1-2: Mild bulging of the disc.  No compressive stenosis. L2-3: Mild bulging of the disc.  No compressive stenosis. L3-4: Mild bulging of the disc. Facet and ligamentous hypertrophy. Mild multifactorial stenosis. L4-5: Facet arthropathy with 4 mm of anterolisthesis. Bulging of the disc. Moderate multifactorial stenosis could cause neural compression. L5-S1: Mild bulging of the disc. Mild facet degeneration. No stenosis. IMPRESSION: Recent superior endplate fracture at L2 with loss of height of 20%. Superior endplate retropulsion of 4 mm, not likely causing neural compression. This could certainly be a cause of back pain. No evidence of progression since the recent radiographs. Old inferior endplate fracture at L1 without evidence of recent extension. Degenerative changes throughout the lumbar region. Multifactorial stenosis at the L4-5 level could cause neural compression on either or both sides. Electronically Signed   By: Nelson Chimes M.D.   On: 11/02/2017 12:43    Procedures Procedures (including critical care time)  Medications Ordered in ED Medications - No data to display   Initial Impression / Assessment and Plan / ED Course  I have reviewed the triage vital signs and the nursing notes.  Pertinent labs & imaging results that were available during my care of the patient were reviewed by me and considered in my medical decision making (see chart for details).     Urinating in the emergency department.  No retained urine at this time.  Patient and family would prefer to go home at this time without a  catheter.  They will return as needed.  Standard return precautions given  Final Clinical Impressions(s) / ED Diagnoses   Final diagnoses:  Foley catheter problem, initial encounter [facility])    ED Discharge Orders    None       Jola Schmidt, MD 11/03/17 [phone]

## 2017-11-06 DIAGNOSIS — B351 Tinea unguium: Secondary | ICD-10-CM | POA: Diagnosis not present

## 2017-11-08 ENCOUNTER — Encounter (HOSPITAL_COMMUNITY): Payer: Self-pay | Admitting: Emergency Medicine

## 2017-11-08 ENCOUNTER — Emergency Department (HOSPITAL_COMMUNITY): Payer: Medicare Other

## 2017-11-08 ENCOUNTER — Telehealth: Payer: Self-pay | Admitting: Cardiology

## 2017-11-08 ENCOUNTER — Emergency Department (HOSPITAL_COMMUNITY)
Admission: EM | Admit: 2017-11-08 | Discharge: 2017-11-09 | Disposition: A | Discharge status: Home / Self Care | Payer: Medicare Other | Attending: Emergency Medicine | Admitting: Emergency Medicine

## 2017-11-08 ENCOUNTER — Other Ambulatory Visit: Payer: Self-pay

## 2017-11-08 DIAGNOSIS — G3184 Mild cognitive impairment, so stated: Secondary | ICD-10-CM | POA: Diagnosis not present

## 2017-11-08 DIAGNOSIS — E119 Type 2 diabetes mellitus without complications: Secondary | ICD-10-CM | POA: Insufficient documentation

## 2017-11-08 DIAGNOSIS — R4689 Other symptoms and signs involving appearance and behavior: Secondary | ICD-10-CM

## 2017-11-08 DIAGNOSIS — F4325 Adjustment disorder with mixed disturbance of emotions and conduct: Secondary | ICD-10-CM | POA: Diagnosis not present

## 2017-11-08 DIAGNOSIS — S6991XA Unspecified injury of right wrist, hand and finger(s), initial encounter: Secondary | ICD-10-CM | POA: Diagnosis not present

## 2017-11-08 DIAGNOSIS — Z008 Encounter for other general examination: Secondary | ICD-10-CM | POA: Diagnosis present

## 2017-11-08 DIAGNOSIS — Z7984 Long term (current) use of oral hypoglycemic drugs: Secondary | ICD-10-CM | POA: Diagnosis not present

## 2017-11-08 DIAGNOSIS — Z7982 Long term (current) use of aspirin: Secondary | ICD-10-CM | POA: Diagnosis not present

## 2017-11-08 DIAGNOSIS — W19XXXA Unspecified fall, initial encounter: Secondary | ICD-10-CM | POA: Diagnosis not present

## 2017-11-08 DIAGNOSIS — M79641 Pain in right hand: Secondary | ICD-10-CM | POA: Diagnosis not present

## 2017-11-08 DIAGNOSIS — Z8673 Personal history of transient ischemic attack (TIA), and cerebral infarction without residual deficits: Secondary | ICD-10-CM | POA: Insufficient documentation

## 2017-11-08 DIAGNOSIS — Z87891 Personal history of nicotine dependence: Secondary | ICD-10-CM | POA: Diagnosis not present

## 2017-11-08 DIAGNOSIS — F4310 Post-traumatic stress disorder, unspecified: Secondary | ICD-10-CM | POA: Diagnosis not present

## 2017-11-08 DIAGNOSIS — Z85818 Personal history of malignant neoplasm of other sites of lip, oral cavity, and pharynx: Secondary | ICD-10-CM | POA: Diagnosis not present

## 2017-11-08 DIAGNOSIS — S8991XA Unspecified injury of right lower leg, initial encounter: Secondary | ICD-10-CM | POA: Diagnosis not present

## 2017-11-08 DIAGNOSIS — Z79899 Other long term (current) drug therapy: Secondary | ICD-10-CM | POA: Diagnosis not present

## 2017-11-08 DIAGNOSIS — M25531 Pain in right wrist: Secondary | ICD-10-CM | POA: Diagnosis not present

## 2017-11-08 DIAGNOSIS — R456 Violent behavior: Secondary | ICD-10-CM | POA: Diagnosis not present

## 2017-11-08 LAB — COMPREHENSIVE METABOLIC PANEL
ALK PHOS: 142 U/L — AB (ref 38–126)
ALT: 12 U/L (ref 0–44)
AST: 16 U/L (ref 15–41)
Albumin: 3.8 g/dL (ref 3.5–5.0)
Anion gap: 13 (ref 5–15)
BILIRUBIN TOTAL: 0.7 mg/dL (ref 0.3–1.2)
BUN: 31 mg/dL — ABNORMAL HIGH (ref 8–23)
CALCIUM: 8.6 mg/dL — AB (ref 8.9–10.3)
CO2: 23 mmol/L (ref 22–32)
Chloride: 105 mmol/L (ref 98–111)
Creatinine, Ser: 1.54 mg/dL — ABNORMAL HIGH (ref 0.61–1.24)
GFR, EST AFRICAN AMERICAN: 43 mL/min — AB (ref >60)
GFR, EST NON AFRICAN AMERICAN: 37 mL/min — AB (ref >60)
Glucose, Bld: 225 mg/dL — ABNORMAL HIGH (ref 70–99)
Potassium: 3.9 mmol/L (ref 3.5–5.1)
Sodium: 141 mmol/L (ref 135–145)
TOTAL PROTEIN: 6.4 g/dL — AB (ref 6.5–8.1)

## 2017-11-08 LAB — ETHANOL

## 2017-11-08 LAB — CBC WITH DIFFERENTIAL/PLATELET
Basophils Absolute: 0 10*3/uL (ref 0.0–0.1)
Basophils Relative: 0 %
EOS ABS: 0.2 10*3/uL (ref 0.0–0.7)
EOS PCT: 3 %
HCT: 41.6 % (ref 39.0–52.0)
Hemoglobin: 13.8 g/dL (ref 13.0–17.0)
Lymphocytes Relative: 14 %
Lymphs Abs: 1 10*3/uL (ref 0.7–4.0)
MCH: 27.8 pg (ref 26.0–34.0)
MCHC: 33.2 g/dL (ref 30.0–36.0)
MCV: 83.7 fL (ref 78.0–100.0)
Monocytes Absolute: 0.5 10*3/uL (ref 0.1–1.0)
Monocytes Relative: 8 %
Neutro Abs: 5.2 10*3/uL (ref 1.7–7.7)
Neutrophils Relative %: 75 %
PLATELETS: 249 10*3/uL (ref 150–400)
RBC: 4.97 MIL/uL (ref 4.22–5.81)
RDW: 15.8 % — ABNORMAL HIGH (ref 11.5–15.5)
WBC: 6.9 10*3/uL (ref 4.0–10.5)

## 2017-11-08 MED ORDER — METFORMIN HCL 500 MG PO TABS
250.0000 mg | ORAL_TABLET | Freq: Every day | ORAL | Status: DC
  Administered 2017-11-09: 250 mg via ORAL
  Filled 2017-11-08: qty 1

## 2017-11-08 MED ORDER — SACUBITRIL-VALSARTAN 24-26 MG PO TABS
1.0000 | ORAL_TABLET | Freq: Every day | ORAL | Status: DC
  Administered 2017-11-09: 1 via ORAL
  Filled 2017-11-08: qty 1

## 2017-11-08 MED ORDER — SPIRONOLACTONE 12.5 MG HALF TABLET
12.5000 mg | ORAL_TABLET | Freq: Every day | ORAL | Status: DC
  Administered 2017-11-09: 12.5 mg via ORAL
  Filled 2017-11-08: qty 1

## 2017-11-08 MED ORDER — PANTOPRAZOLE SODIUM 40 MG PO TBEC
40.0000 mg | DELAYED_RELEASE_TABLET | Freq: Every day | ORAL | Status: DC
  Administered 2017-11-09: 40 mg via ORAL
  Filled 2017-11-08: qty 1

## 2017-11-08 MED ORDER — LORAZEPAM 0.5 MG PO TABS
0.5000 mg | ORAL_TABLET | Freq: Two times a day (BID) | ORAL | Status: DC
  Administered 2017-11-09 (×2): 0.5 mg via ORAL
  Filled 2017-11-08 (×2): qty 1

## 2017-11-08 MED ORDER — ASPIRIN EC 81 MG PO TBEC
81.0000 mg | DELAYED_RELEASE_TABLET | Freq: Every day | ORAL | Status: DC
  Administered 2017-11-09: 81 mg via ORAL
  Filled 2017-11-08: qty 1

## 2017-11-08 MED ORDER — ESCITALOPRAM OXALATE 10 MG PO TABS
20.0000 mg | ORAL_TABLET | Freq: Every day | ORAL | Status: DC
  Administered 2017-11-09: 20 mg via ORAL
  Filled 2017-11-08: qty 2

## 2017-11-08 MED ORDER — GLIPIZIDE 5 MG PO TABS
5.0000 mg | ORAL_TABLET | Freq: Every day | ORAL | Status: DC
  Administered 2017-11-09: 5 mg via ORAL
  Filled 2017-11-08: qty 1

## 2017-11-08 MED ORDER — ACETAMINOPHEN 500 MG PO TABS
500.0000 mg | ORAL_TABLET | Freq: Four times a day (QID) | ORAL | Status: DC
  Administered 2017-11-09 (×2): 500 mg via ORAL
  Filled 2017-11-08 (×2): qty 1

## 2017-11-08 NOTE — Progress Notes (Signed)
CSW received a call from TTS who stated pt will be kept overnight for observation to be seen by psychiatry in the AM.  CSW met with pt's son Hagarty,George at ph: [phone] who stated facility ([facility]) stated pt had possibly become "too much", per pt's son and had mentioned pt, "not being able to return".    CSW advised pt's son the facilit would have to have documentation of aggression in the form of a filed charge with [employer] and the pt being given a 30-day notice and that the cSW Dept would be glad to inform the facility of that should they refuse to allow the pt to return.  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  CSW encouraged pt's son to reach out to the [place] to see what is available for the pt, but advised the pt's son that the pt would have to return to the facility for placement elsewhere with the assistance of the [facility] staff should [facility] attempt to insist on the pt D/C'ing.  Pt's son stated [facility] has a Memory Care Unit and CSW provided pt's son with a list of ALF's and SNF's that may provide Memory Care Service and encouraged pt's son to look into a possible next level of care facility and to also ask the [place] how the [place] may be of help also.  CSW advised the pt's son to seek an appointment with the pt's neurologist should a diagnosis of some sort (dementia, etc) be necessary to seek admission into a Memory Care facility and pt's son stated pt does have a neurologist that the pt sees.  CSW advised the pt's son that the pt has Medicare A&B and that a 3-day inpatient stay in a medical hospital is necessary for the pt to attempt placement into a SNF faciity and pt's son voiced understanding.  CSW advised pt will be seen by psychiatry in the morning and that if the pt is ready for D/C, per the psychiatry team and if [facility] refuses to allow the pt to return to please contact the CSW's at [phone] so  the [place] can contact the facility to advise them of the pt's rights.  Pt's son was appreciative and thanked the CSW and pt's Therapist, sports.  2nd shift ED CSW will leave handoff for 1st shift ED CSW.

## 2017-11-08 NOTE — Telephone Encounter (Signed)
New Message:   Pt is requesting a return call about up coming appt.

## 2017-11-08 NOTE — BH Assessment (Addendum)
Assessment Note  Antonio Buchanan is an 84 y.o. male who presents to the ED VOL accompanied by his son. Pt reportedly became aggressive in the living facility today and refused to comply with the rules. Pt admits to this writer that he was trying to leave his room and staff stopped him. Pt states he became upset but denies any physical violence. Pt's son was also present and states he is the pt's POA. Pt's son reports the pt recently moved to [place] about 3 weeks ago. Pt's son states prior to [place] the pt and his wife lived at home and received assistance in their home. Pt's son states that pt became unable to manage his own care and he and his wife transitioned to an assisted living facility. Pt's son states the pt may need more than just "assisted living" and he states he is unsure if [facility] will allow him to return. Pt denies thoughts of harm to self or others. Pt also denies HI and denies AVH.   Pt is smiling at this writer during the assessment. Pt is inaudible at times during the assessment and goes off into tangents about being [place] and what his name translates to in [place]. Pt also asks this Probation officer for a ham and cheese sandwich on rye bread. Pt states he has been having difficulty sleeping for the past 2 weeks and has frequent falls. Pt's son is concerned that the pt's medications are not interacting well with each other. Pt's son states the pt has a hx of PTSD and is 100% covered by the [place].   Per Lindon Romp, NP pt is recommended for continued observation for safety and to be reassessed in the AM by psych. Pt may also need social work consult to assist with care and placement if needed. EDP Lennice Sites, DO and TCU nurse Cyril Mourning, RN have been advised.   Diagnosis: Mild neurocognitive disorder due to another medical condition; PTSD  Past Medical History:  Past Medical History:  Diagnosis Date  . Anxiety   .  Aortic stenosis   . Benign neoplasm of colon   . Cancer ([place])    left cheek  . Diabetes mellitus   . Diverticulosis of colon (without mention of hemorrhage) 02/03/2005  . Hemorrhoids 02/03/2005   Internal and external  . History of Lyme disease   . HTN (hypertension)   . Hyperlipemia   . Personal history of colonic polyps   . Phlebitis 06-24-2014   left leg  . Polymyalgia rheumatica ([place])   . PTSD (post-traumatic stress disorder)   . Spinal stenosis   . Stroke ([place])   . Thalassemia   . Varicose veins     Past Surgical History:  Procedure Laterality Date  . CATARACT EXTRACTION, BILATERAL    . HEMORROIDECTOMY    . KIDNEY STONE SURGERY      Family History:  Family History  Problem Relation Age of Onset  . Diabetes Father   . Leukemia Mother   . Hypertension Mother   . Arthritis Mother   . Diabetes Sister     Social History:  reports that he has quit smoking. His smoking use included cigars. He quit after 78.00 years of use. He has never used smokeless tobacco. He reports that he does not drink alcohol or use drugs.  Additional Social History:  Alcohol / Drug Use Pain Medications: See MAR Prescriptions: See MAR Over the Counter: See MAR History of alcohol /  drug use?: No history of alcohol / drug abuse  CIWA: CIWA-Ar BP: 105/72 Pulse Rate: 84 COWS:    Allergies:  Allergies  Allergen Reactions  . Cephalosporins Hives and Itching  . Lamisil Af Defense [Tolnaftate] Hives    Severe hives    Home Medications:  (Not in a hospital admission)  OB/GYN Status:  No LMP for male patient.  General Assessment Data [facility] of Assessment: [facility] TTS Assessment: In system Is this a Tele or Face-to-Face Assessment?: Face-to-Face Is this an Initial Assessment or a Re-assessment for this encounter?: Initial Assessment Marital status: Married Is patient pregnant?: No Pregnancy Status: No Living Arrangements: Other (Comment)([facility]) Can pt  return to current living arrangement?: Yes Admission Status: Voluntary Is patient capable of signing voluntary admission?: Yes Referral Source: Self/Family/Friend Insurance type: [facility]     Crisis Care Plan Living Arrangements: Other (Comment)([place]) Legal Guardian: Other relative Name of Psychiatrist: Dr. Casimiro Needle, MD Name of Therapist: none  Education Status Is patient currently in school?: No Is the patient employed, unemployed or receiving disability?: Receiving disability income  Risk to self with the past 6 months Suicidal Ideation: No Has patient been a risk to self within the past 6 months prior to admission? : No Suicidal Intent: No Has patient had any suicidal intent within the past 6 months prior to admission? : No Is patient at risk for suicide?: No Suicidal Plan?: No Has patient had any suicidal plan within the past 6 months prior to admission? : No Access to Means: No What has been your use of drugs/alcohol within the last 12 months?: denies Previous Attempts/Gestures: No Triggers for Past Attempts: None known Intentional Self Injurious Behavior: None Family Suicide History: No Recent stressful life event(s): Recent negative physical changes Persecutory voices/beliefs?: No Depression: No Substance abuse history and/or treatment for substance abuse?: No Suicide prevention information given to non-admitted patients: Not applicable  Risk to Others within the past 6 months Homicidal Ideation: No Does patient have any lifetime risk of violence toward others beyond the six months prior to admission? : No Thoughts of Harm to Others: No Current Homicidal Intent: No Current Homicidal Plan: No Access to Homicidal Means: No History of harm to others?: No Assessment of Violence: None Noted Does patient have access to weapons?: No Criminal Charges Pending?: No Does patient have a court date: No Is patient on probation?:  No  Psychosis Hallucinations: None noted Delusions: None noted  Mental Status Report Appearance/Hygiene: In scrubs Eye Contact: Good Motor Activity: Unsteady Speech: Slurred Level of Consciousness: Quiet/awake Mood: Labile Affect: Labile Anxiety Level: None Thought Processes: Tangential Judgement: Partial Orientation: Person, Place, Time Obsessive Compulsive Thoughts/Behaviors: None  Cognitive Functioning Concentration: Decreased Memory: Remote Impaired, Recent Impaired Is patient IDD: No Is patient DD?: No Insight: Fair Impulse Control: Poor Appetite: Fair Have you had any weight changes? : No Change Sleep: Decreased Total Hours of Sleep: 4 Vegetative Symptoms: None  ADLScreening [facility] Assessment Services) Patient's cognitive ability adequate to safely complete daily activities?: Yes Patient able to express need for assistance with ADLs?: Yes Independently performs ADLs?: No  Prior Inpatient Therapy Prior Inpatient Therapy: No  Prior Outpatient Therapy Prior Outpatient Therapy: Yes Prior Therapy Dates: current Prior Therapy Facilty/Provider(s): Dr. Casimiro Needle, MD Reason for Treatment: MED MANAGEMENT  Does patient have an ACCT team?: No Does patient have Intensive In-House Services?  : No Does patient have [facility] services? : No Does patient have P4CC services?: No  ADL Screening (condition  at time of admission) Patient's cognitive ability adequate to safely complete daily activities?: Yes Is the patient deaf or have difficulty hearing?: Yes Does the patient have difficulty seeing, even when wearing glasses/contacts?: Yes Does the patient have difficulty concentrating, remembering, or making decisions?: Yes Patient able to express need for assistance with ADLs?: Yes Does the patient have difficulty dressing or bathing?: Yes Independently performs ADLs?: No Communication: Independent Dressing (OT): Needs assistance Is this a change from baseline?: Pre-admission  baseline Grooming: Needs assistance Is this a change from baseline?: Pre-admission baseline Feeding: Independent Bathing: Needs assistance Is this a change from baseline?: Pre-admission baseline Toileting: Needs assistance Is this a change from baseline?: Pre-admission baseline In/Out Bed: Needs assistance Is this a change from baseline?: Pre-admission baseline Walks in Home: Needs assistance Is this a change from baseline?: Pre-admission baseline Does the patient have difficulty walking or climbing stairs?: Yes Weakness of Legs: Both Weakness of Arms/Hands: Both  Home Assistive Devices/Equipment Home Assistive Devices/Equipment: Wheelchair, Dentures (specify type)    Abuse/Neglect Assessment (Assessment to be complete while patient is alone) Abuse/Neglect Assessment Can Be Completed: Yes Physical Abuse: Denies Verbal Abuse: Denies Sexual Abuse: Denies Exploitation of patient/patient's resources: Denies Self-Neglect: Denies     Regulatory affairs officer (For Healthcare) Does Patient Have a Medical Advance Directive?: Yes Does patient want to make changes to medical advance directive?: No - Patient declined Type of Advance Directive: Out of facility DNR (pink MOST or yellow form), Healthcare Power of [place] in Chart?: No - copy requested Copy of Living Will in Chart?: Yes    Additional Information 1:1 In Past 12 Months?: No CIRT Risk: No Elopement Risk: No Does patient have medical clearance?: Yes     Disposition: Per Lindon Romp, NP pt is recommended for continued observation for safety and to be reassessed in the AM by psych. Pt may also need social work consult to assist with care and placement if needed. EDP Lennice Sites, DO and TCU nurse Cyril Mourning, RN have been advised.  Disposition Initial Assessment Completed for this Encounter: Yes Disposition of Patient: (overnight OBS pending AM psych assessment) Patient refused recommended  treatment: No Patient referred to: Social Work  On Site Evaluation by:   Reviewed with Physician:    Lyanne Co 11/08/2017 10:47 PM

## 2017-11-08 NOTE — ED Triage Notes (Addendum)
Pt BIB son after a fall onto his right hand. Patient also brought in due to concerns about patient's mental status. Patient per son and nursing home staff stated that he had some inapropriate behavior by stopping families at the home. Per son, patient has had some "passive suicidal thoughts." Nursing home was going to discharge patient and was going to make him leave.

## 2017-11-08 NOTE — Progress Notes (Signed)
Per Lindon Romp, NP pt is recommended for continued observation for safety and to be reassessed in the AM by psych. Pt may also need social work consult to assist with care and placement if needed. EDP Lennice Sites, DO and TCU nurse Cyril Mourning, RN have been advised.   TTS spoke with [place], Alphonse Guild, LCSW who states he will speak with the pt and his son.   Lind Covert, MSW, LCSW Therapeutic Triage Specialist  [phone]

## 2017-11-08 NOTE — Progress Notes (Addendum)
Consult request has been received. CSW attempting to follow up at present time.  CSW notes in chart pt is to be seen by TTS.  CSW will continue to follow for D/C needs and is  CSW awaiting results of TTS.  Alphonse Guild. Yarissa Reining, LCSW, LCAS, CSI Clinical Social Worker Ph: [phone]

## 2017-11-08 NOTE — ED Provider Notes (Signed)
[facility] DEPT Provider Note   CSN: [id number] Arrival date & time: 11/08/17  1946  History   Chief Complaint Chief Complaint  Patient presents with  . Fall  . Medical Clearance    HPI Emad Brechtel is a 84 y.o. male.  The history is provided by the patient and a caregiver.  Mental Health Problem  Presenting symptoms: aggressive behavior and agitation   Patient accompanied by:  Caregiver Degree of incapacity (severity):  Moderate Onset quality:  Gradual Timing:  Intermittent Progression:  Waxing and waning Chronicity:  Chronic Context: medication   Treatment compliance:  All of the time Relieved by:  Nothing Worsened by:  Nothing Associated symptoms: irritability   Associated symptoms: no abdominal pain, no appetite change and no chest pain   Risk factors: hx of mental illness (PTSD)     Past Medical History:  Diagnosis Date  . Anxiety   . Aortic stenosis   . Benign neoplasm of colon   . Cancer ([place])    left cheek  . Diabetes mellitus   . Diverticulosis of colon (without mention of hemorrhage) 02/03/2005  . Hemorrhoids 02/03/2005   Internal and external  . History of Lyme disease   . HTN (hypertension)   . Hyperlipemia   . Personal history of colonic polyps   . Phlebitis 06-24-2014   left leg  . Polymyalgia rheumatica ([place])   . PTSD (post-traumatic stress disorder)   . Spinal stenosis   . Stroke ([place])   . Thalassemia   . Varicose veins     Patient Active Problem List   Diagnosis Date Noted  . Acute on chronic congestive heart failure ([place])   . Multiple closed fractures of ribs of right side   . Primary osteoarthritis of left knee 09/10/2017  . Acute arterial ischemic stroke, vertebrobasilar, cerebellar ([place]) 09/04/2017  . Delirium 09/04/2017  . Ataxic gait 09/04/2017  . Polymyalgia rheumatica ([place]) 06/09/2016  . Spondylosis of lumbar region without myelopathy or radiculopathy 06/09/2016  . DDD  (degenerative disc disease), cervical 06/09/2016  . Primary osteoarthritis of both hands 06/09/2016  . History of scoliosis 06/09/2016  . Venous insufficiency 06/09/2016  . History of thalassemia 06/09/2016  . History of trigeminal neuralgia 06/09/2016  . History of diabetes mellitus 06/09/2016  . Rash 09/08/2014  . ARF (acute renal failure) ([place]) 09/08/2014  . Venous stasis dermatitis of left lower extremity 09/08/2014  . Dehydration 09/08/2014  . Allergic reaction caused by a drug 09/08/2014  . Congestive dilated cardiomyopathy ([place]) 08/29/2014  . Chronic constipation 03/26/2012  . Aortic stenosis 03/01/2012  . Chronic venous hypertension without complications 37/12/6267  . Murmur 02/24/2011  . Tobacco abuse 02/24/2011  . Diabetes mellitus ([place])   . HTN (hypertension)   . Hyperlipemia   . Spinal stenosis   . Thalassemia   . History of Lyme disease   . PTSD (post-traumatic stress disorder)   . GERD 04/06/2009  . NAUSEA 04/06/2009  . FLATULENCE-GAS-BLOATING 04/06/2009  . ABDOMINAL PAIN-EPIGASTRIC 04/06/2009    Past Surgical History:  Procedure Laterality Date  . CATARACT EXTRACTION, BILATERAL    . HEMORROIDECTOMY    . KIDNEY STONE SURGERY          Home Medications    Prior to Admission medications   Medication Sig Start Date End Date Taking? Authorizing Provider  acetaminophen (TYLENOL) 500 MG tablet Take 500 mg by mouth 4 (four) times daily.   Yes [provider]  aspirin 81 MG tablet Take 1 tablet (  81 mg total) by mouth daily. 10/06/17  Yes Garvin Fila, MD  escitalopram (LEXAPRO) 20 MG tablet Take 20 mg by mouth daily.   Yes [provider]  glipiZIDE (GLUCOTROL) 5 MG tablet Take 5 mg by mouth daily before breakfast.    Yes [provider]  HYDROcodone-acetaminophen (NORCO) 5-325 MG tablet Take 0.5-1 tablets by mouth every 6 (six) hours as needed. Patient taking differently: Take 0.5-1 tablets by mouth every 6 (six) hours as needed for  moderate pain or severe pain.  10/28/17  Yes Sherwood Gambler, MD  LORazepam (ATIVAN) 0.5 MG tablet Take 0.5 mg by mouth 2 (two) times daily.    Yes [provider]  magnesium oxide (MAG-OX) 400 MG tablet Take 400 mg by mouth daily.   Yes [provider]  Menthol, Topical Analgesic, (BIOFREEZE COLORLESS) 4 % GEL Apply 1 application topically 3 (three) times daily.   Yes [provider]  metFORMIN (GLUCOPHAGE) 500 MG tablet Take 250 mg by mouth daily with breakfast.    Yes [provider]  Multiple Vitamins-Minerals (PRESERVISION AREDS 2 PO) Take 1 tablet by mouth 2 (two) times daily.   Yes [provider]  pantoprazole (PROTONIX) 40 MG tablet Take 1 tablet by mouth every morning. Patient taking differently: Take 40 mg by mouth daily.  06/21/17  Yes Esterwood, Amy S, PA-C  sacubitril-valsartan (ENTRESTO) 24-26 MG Take 1 tablet by mouth 2 (two) times daily. Patient taking differently: Take 1 tablet by mouth daily.  08/07/17  Yes Lendon Colonel, NP  spironolactone (ALDACTONE) 25 MG tablet Take 0.5 tablets (12.5 mg total) by mouth daily. 08/07/17  Yes Lendon Colonel, NP  vitamin B-12 (CYANOCOBALAMIN) 1000 MCG tablet Take 1 tablet (1,000 mcg total) by mouth daily. 09/05/17  Yes Alma Friendly, MD  furosemide (LASIX) 20 MG tablet Take 1 tablet (20 mg total) by mouth daily for 7 days. 09/18/17 10/06/17  Doreatha Lew, MD  metoprolol succinate (TOPROL-XL) 25 MG 24 hr tablet Take 1 tablet (25 mg total) by mouth daily. Patient not taking: Reported on 10/28/2017 09/05/17   Alma Friendly, MD  predniSONE (DELTASONE) 5 MG tablet TAKE 1 TABLET(5 MG) BY MOUTH DAILY WITH BREAKFAST Patient not taking: No sig reported 09/11/17   Bo Merino, MD  traMADol (ULTRAM) 50 MG tablet Take 1 tablet (50 mg total) by mouth every 6 (six) hours as needed. Patient not taking: Reported on 10/28/2017 10/11/17   Dorie Rank, MD  traZODone (DESYREL) 50 MG tablet Take 1  tablet (50 mg total) by mouth at bedtime. Patient not taking: Reported on 10/28/2017 09/18/17   Doreatha Lew, MD    Family History Family History  Problem Relation Age of Onset  . Diabetes Father   . Leukemia Mother   . Hypertension Mother   . Arthritis Mother   . Diabetes Sister     Social History Social History   Tobacco Use  . Smoking status: Former Smoker    Years: 78.00    Types: Cigars  . Smokeless tobacco: Never Used  Substance Use Topics  . Alcohol use: No    Alcohol/week: 0.0 standard drinks  . Drug use: No     Allergies   Cephalosporins and Lamisil af defense [tolnaftate]   Review of Systems Review of Systems  Constitutional: Positive for irritability. Negative for appetite change, chills and fever.  HENT: Negative for ear pain and sore throat.   Eyes: Negative for pain and visual disturbance.  Respiratory:  Negative for cough and shortness of breath.   Cardiovascular: Negative for chest pain and palpitations.  Gastrointestinal: Negative for abdominal pain and vomiting.  Genitourinary: Negative for dysuria and hematuria.  Musculoskeletal: Negative for arthralgias and back pain.       Multiple falls, left hand pain   Skin: Negative for color change and rash.  Neurological: Negative for seizures and syncope.  Psychiatric/Behavioral: Positive for agitation.  All other systems reviewed and are negative.    Physical Exam Updated Vital Signs  ED Triage Vitals [11/08/17 2004]  Enc Vitals Group     BP (!) 139/97     Pulse Rate 93     Resp 16     Temp 97.6 F (36.4 C)     Temp Source Oral     SpO2 99 %     Weight      Height      Head Circumference      Peak Flow      Pain Score      Pain Loc      Pain Edu?      Excl. in [place]?     Physical Exam  Constitutional: He appears well-developed and well-nourished.  HENT:  Head: Normocephalic and atraumatic.  Eyes: Pupils are equal, round, and reactive to light. Conjunctivae and EOM are normal.    Neck: Normal range of motion. Neck supple.  Cardiovascular: Normal rate, regular rhythm, normal heart sounds and intact distal pulses. Exam reveals no friction rub.  No murmur heard. Pulmonary/Chest: Effort normal and breath sounds normal. No stridor. No respiratory distress.  Abdominal: Soft. Bowel sounds are normal. There is no tenderness.  Musculoskeletal: Normal range of motion. He exhibits tenderness (left hand, over knuckles). He exhibits no edema.  Neurological: He is alert.  Skin: Skin is warm and dry. Capillary refill takes less than 2 seconds.  Psychiatric: He has a normal mood and affect.  Nursing note and vitals reviewed.    ED Treatments / Results  Labs (all labs ordered are listed, but only abnormal results are displayed) Labs Reviewed  COMPREHENSIVE METABOLIC PANEL - Abnormal; Notable for the following components:      Result Value   Glucose, Bld 225 (*)    BUN 31 (*)    Creatinine, Ser 1.54 (*)    Calcium 8.6 (*)    Total Protein 6.4 (*)    Alkaline Phosphatase 142 (*)    GFR calc non Af Amer 37 (*)    GFR calc Af Amer 43 (*)    All other components within normal limits  CBC WITH DIFFERENTIAL/PLATELET - Abnormal; Notable for the following components:   RDW 15.8 (*)    All other components within normal limits  ETHANOL  RAPID URINE DRUG SCREEN, HOSP PERFORMED  URINALYSIS, ROUTINE W REFLEX MICROSCOPIC    EKG EKG Interpretation  Date/Time:  Wednesday November 08 2017 22:08:41 EDT Ventricular Rate:  84 PR Interval:  222 QRS Duration: 106 QT Interval:  400 QTC Calculation: 472 R Axis:   -21 Text Interpretation:  Sinus rhythm with 1st degree A-V block Left ventricular hypertrophy with repolarization abnormality Abnormal ECG Confirmed by Lennice Sites [phone]) on 11/08/2017 10:42:43 PM   Radiology Dg Wrist Complete Right  Result Date: 11/08/2017 CLINICAL DATA:  Right hand and wrist pain after fall. EXAM: RIGHT WRIST - COMPLETE 3+ VIEW COMPARISON:  None.  FINDINGS: Fragmented osteophyte from the radial styloid versus small chip fracture. Multifocal osteoarthritis, including the distal radioulnar joint, radiocarpal joint, and  carpal bones. Widening of the scapholunate interval of unknown chronicity. No osseous erosions or bony destructive change. No focal soft tissue abnormality. IMPRESSION: 1. Fragmented radial styloid may be due to fragmented osteophyte versus small acute chip fracture. Chronic fragmented osteophyte is favored. 2. Multifocal osteoarthritis throughout the wrist. Electronically Signed   By: Jeb Levering M.D.   On: 11/08/2017 21:51   Dg Hand Complete Right  Result Date: 11/08/2017 CLINICAL DATA:  Right hand and wrist pain.  Fall. EXAM: RIGHT HAND - COMPLETE 3+ VIEW COMPARISON:  None. FINDINGS: Multifocal osteoarthritis involving the digits, throughout the thumb including the carpal metacarpal joint, throughout the carpal bones. Possible gull wing deformity of the fifth digit middle phalanx which may represent erosive osteoarthritis. No acute fracture of the hand. No dislocation. No focal soft tissue abnormality. IMPRESSION: 1. No fracture or dislocation of the hand. 2. Multifocal osteoarthritis. Possible erosive osteoarthritis of the fifth digit. Electronically Signed   By: Jeb Levering M.D.   On: 11/08/2017 21:52    Procedures Procedures (including critical care time)  Medications Ordered in ED Medications  acetaminophen (TYLENOL) tablet 500 mg (has no administration in time range)  aspirin EC tablet 81 mg (has no administration in time range)  escitalopram (LEXAPRO) tablet 20 mg (has no administration in time range)  glipiZIDE (GLUCOTROL) tablet 5 mg (has no administration in time range)  LORazepam (ATIVAN) tablet 0.5 mg (has no administration in time range)  metFORMIN (GLUCOPHAGE) tablet 250 mg (has no administration in time range)  pantoprazole (PROTONIX) EC tablet 40 mg (has no administration in time range)    spironolactone (ALDACTONE) tablet 12.5 mg (has no administration in time range)  sacubitril-valsartan (ENTRESTO) 24-26 mg per tablet (has no administration in time range)     Initial Impression / Assessment and Plan / ED Course  I have reviewed the triage vital signs and the nursing notes.  Pertinent labs & imaging results that were available during my care of the patient were reviewed by me and considered in my medical decision making (see chart for details).     Kwasi Joung is a 84 year old male with history of anxiety, PTSD, stroke, hypertension, diabetes who presents to the ED with behavioral concern.  Patient with unremarkable vitals.  No fever.  Patient here with his son who states that patient is current living and a facility that he has become too aggressive to living.  Patient continues to have anxiety, aggressive behavior.  He does not have any psychiatry managing his medications at this time.  Son would like to transfer patient to a geriatric psychiatric facility as he has become increasingly aggressive over the last several months.  Patient has had multiple falls.  Possibly from his right hand recently.  Patient has some tenderness over the right hand across the knuckles.  No obvious deformities.  Patient is chronically ill-appearing but has normal vitals.  Exam is overall unremarkable.  Social work and psychiatry are consulted to help with placement.  Basic labs were ordered.  Home medications were ordered.  X-rays of right hand ordered.  X-ray showed likely old fractures to the right radial styloid.  Patient is not tender in this area.  Suspect patient likely with old healing fracture/arthritis.  Patient with overall unremarkable labs.  Slightly elevated creatinine but appears to be close to his baseline.  No significant anemia, electrolyte abnormality.  EKG with no acute findings, no ST changes.  Unchanged from prior.  Social work and psychiatry recommend observation  stay.  Psychiatry will evaluate the patient in the morning for medication adjustment and social work will work with family member for placement.  Patient and family member are here voluntarily.  Family (Son) should be reengage if decision is made for patient to go home before long-term placement.  Final Clinical Impressions(s) / ED Diagnoses   Final diagnoses:  Aggressive behavior    ED Discharge Orders    None       Lennice Sites, DO 11/08/17 2348

## 2017-11-08 NOTE — Telephone Encounter (Signed)
SPOKE WITH ELIA Barberi SHE STATES THAT SHE IS UNABLE TO BRING FATHER AND WAS WONDERING IF IT WAS NECESSARY FOR HIM TO COME TO [facility] ON Monday. UPON FURTHER QUESTIONING SHE STATES THAT SHE THINKS THAT "HE IS HAVING MINI-STROKES AGAIN" SHE STATES THAT SHE IS NOT FOR SURE, SUGGESTED THAT SHE TAKE HIM TO THE ER SHE STATES THAT SHE WILL BE HERE ON Monday. FOR APPT

## 2017-11-08 NOTE — Progress Notes (Signed)
Pt's son (states he is POA, no documentation in chart at this time) request to be contacted if psych needs additional information at [phone]. Son states the pt's daughter (his sister Roshun, [place]) is also able to provide additional information if needed.   Lind Covert, MSW, LCSW Therapeutic Triage Specialist  [phone]

## 2017-11-09 DIAGNOSIS — Z7401 Bed confinement status: Secondary | ICD-10-CM | POA: Diagnosis not present

## 2017-11-09 DIAGNOSIS — F4325 Adjustment disorder with mixed disturbance of emotions and conduct: Secondary | ICD-10-CM | POA: Diagnosis present

## 2017-11-09 DIAGNOSIS — G3184 Mild cognitive impairment, so stated: Secondary | ICD-10-CM | POA: Diagnosis not present

## 2017-11-09 DIAGNOSIS — M2550 Pain in unspecified joint: Secondary | ICD-10-CM | POA: Diagnosis not present

## 2017-11-09 MED ORDER — OLANZAPINE 2.5 MG PO TABS: 2.5000 mg | ORAL_TABLET | Freq: Two times a day (BID) | ORAL | 0 refills | Status: AC | PRN

## 2017-11-09 MED ORDER — OLANZAPINE 2.5 MG PO TABS: 2.5000 mg | ORAL_TABLET | Freq: Two times a day (BID) | ORAL | Status: DC | PRN

## 2017-11-09 NOTE — Progress Notes (Deleted)
HPI: Antonio Buchanan. Myoview in 2008 showed EF 59 and normal perfusion.There has been a question of severity of aortic stenosis in the past.Dobutamine echocardiogram in February 2015 was felt consistent with severe aortic stenosis; low normal LV function. Last echocardiogram June 2019 showed ejection fraction 20%.  There was moderate to severe aortic stenosis with mean gradient 27 mmHg.  Mild mitral stenosis with trace mitral regurgitation.  Severe left atrial enlargement.  Carotid Dopplers June 2019 showed 1 to 39% bilateral stenosis.  Since last seen,   Current Outpatient Medications  Medication Sig Dispense Refill  . acetaminophen (TYLENOL) 500 MG tablet Take 500 mg by mouth 4 (four) times daily.    Marland Kitchen aspirin 81 MG tablet Take 1 tablet (81 mg total) by mouth daily. 30 tablet 1  . escitalopram (LEXAPRO) 20 MG tablet Take 20 mg by mouth daily.    . furosemide (LASIX) 20 MG tablet Take 1 tablet (20 mg total) by mouth daily for 7 days. 7 tablet 0  . glipiZIDE (GLUCOTROL) 5 MG tablet Take 5 mg by mouth daily before breakfast.     . HYDROcodone-acetaminophen (NORCO) 5-325 MG tablet Take 0.5-1 tablets by mouth every 6 (six) hours Buchanan needed. (Patient taking differently: Take 0.5-1 tablets by mouth every 6 (six) hours Buchanan needed for moderate pain or severe pain. ) 15 tablet 0  . LORazepam (ATIVAN) 0.5 MG tablet Take 0.5 mg by mouth 2 (two) times daily.     . magnesium oxide (MAG-OX) 400 MG tablet Take 400 mg by mouth daily.    . Menthol, Topical Analgesic, (BIOFREEZE COLORLESS) 4 % GEL Apply 1 application topically 3 (three) times daily.    . metFORMIN (GLUCOPHAGE) 500 MG tablet Take 250 mg by mouth daily with breakfast.     . metoprolol succinate (TOPROL-XL) 25 MG 24 hr tablet Take 1 tablet (25 mg total) by mouth daily. (Patient not taking: Reported on 10/28/2017) 30 tablet 0  . Multiple Vitamins-Minerals (PRESERVISION AREDS 2 PO) Take 1 tablet by mouth 2 (two) times daily.    Marland Kitchen OLANZapine (ZYPREXA) 2.5  MG tablet Take 1 tablet (2.5 mg total) by mouth 2 (two) times daily Buchanan needed (agitation). 60 tablet 0  . pantoprazole (PROTONIX) 40 MG tablet Take 1 tablet by mouth every morning. (Patient taking differently: Take 40 mg by mouth daily. ) 30 tablet 11  . predniSONE (DELTASONE) 5 MG tablet TAKE 1 TABLET(5 MG) BY MOUTH DAILY WITH BREAKFAST (Patient not taking: No sig reported) 90 tablet 0  . sacubitril-valsartan (ENTRESTO) 24-26 MG Take 1 tablet by mouth 2 (two) times daily. (Patient taking differently: Take 1 tablet by mouth daily. ) 60 tablet 2  . spironolactone (ALDACTONE) 25 MG tablet Take 0.5 tablets (12.5 mg total) by mouth daily. 15 tablet 3  . traMADol (ULTRAM) 50 MG tablet Take 1 tablet (50 mg total) by mouth every 6 (six) hours Buchanan needed. (Patient not taking: Reported on 10/28/2017) 15 tablet 0  . traZODone (DESYREL) 50 MG tablet Take 1 tablet (50 mg total) by mouth at bedtime. (Patient not taking: Reported on 10/28/2017) 30 tablet 0  . vitamin B-12 (CYANOCOBALAMIN) 1000 MCG tablet Take 1 tablet (1,000 mcg total) by mouth daily. 30 tablet 0   No current facility-administered medications for this visit.      Past Medical History:  Diagnosis Date  . Anxiety   . Aortic stenosis   . Benign neoplasm of colon   . Cancer ([place])    left  cheek  . Diabetes mellitus   . Diverticulosis of colon (without mention of hemorrhage) 02/03/2005  . Hemorrhoids 02/03/2005   Internal and external  . History of Lyme disease   . HTN (hypertension)   . Hyperlipemia   . Personal history of colonic polyps   . Phlebitis 06-24-2014   left leg  . Polymyalgia rheumatica ([place])   . PTSD (post-traumatic stress disorder)   . Spinal stenosis   . Stroke ([place])   . Thalassemia   . Varicose veins     Past Surgical History:  Procedure Laterality Date  . CATARACT EXTRACTION, BILATERAL    . HEMORROIDECTOMY    . KIDNEY STONE SURGERY      Social History   Socioeconomic History  . Marital status: Married     Spouse name: Not on file  . Number of children: 2  . Years of education: Not on file  . Highest education level: Not on file  Occupational History  . Occupation: retired Wellsite geologist  . Financial resource strain: Not on file  . Food insecurity:    Worry: Not on file    Inability: Not on file  . Transportation needs:    Medical: Not on file    Non-medical: Not on file  Tobacco Use  . Smoking status: Former Smoker    Years: 78.00    Types: Cigars  . Smokeless tobacco: Never Used  Substance and Sexual Activity  . Alcohol use: No    Alcohol/week: 0.0 standard drinks  . Drug use: No  . Sexual activity: Not on file  Lifestyle  . Physical activity:    Days per week: Not on file    Minutes per session: Not on file  . Stress: Not on file  Relationships  . Social connections:    Talks on phone: Not on file    Gets together: Not on file    Attends religious service: Not on file    Active member of club or organization: Not on file    Attends meetings of clubs or organizations: Not on file    Relationship status: Not on file  . Intimate partner violence:    Fear of current or ex partner: Not on file    Emotionally abused: Not on file    Physically abused: Not on file    Forced sexual activity: Not on file  Other Topics Concern  . Not on file  Social History Narrative  . Not on file    Family History  Problem Relation Age of Onset  . Diabetes Father   . Leukemia Mother   . Hypertension Mother   . Arthritis Mother   . Diabetes Sister     ROS: no fevers or chills, productive cough, hemoptysis, dysphasia, odynophagia, melena, hematochezia, dysuria, hematuria, rash, seizure activity, orthopnea, PND, pedal edema, claudication. Remaining systems are negative.  Physical Exam: Well-developed well-nourished in no acute distress.  Skin is warm and dry.  HEENT is normal.  Neck is supple.  Chest is clear to auscultation with normal expansion.  Cardiovascular exam is  regular rate and rhythm.  Abdominal exam nontender or distended. No masses palpated. Extremities show no edema. neuro grossly intact  ECG- personally reviewed  A/P  1  Kirk Ruths, MD

## 2017-11-09 NOTE — Consult Note (Addendum)
[facility] Psych ED Discharge  11/09/2017 10:05 AM Antonio Buchanan  MRN:  [id number] Principal Problem: Adjustment disorder with mixed disturbance of emotions and conduct Discharge Diagnoses:  Patient Active Problem List   Diagnosis Date Noted  . Adjustment disorder with mixed disturbance of emotions and conduct [F43.25] 11/09/2017    Priority: High  . Acute on chronic congestive heart failure ([place]) [I50.9]   . Multiple closed fractures of ribs of right side [S22.41XA]   . Primary osteoarthritis of left knee [M17.12] 09/10/2017  . Acute arterial ischemic stroke, vertebrobasilar, cerebellar ([place]) [I63.29] 09/04/2017  . Delirium [R41.0] 09/04/2017  . Ataxic gait [R26.0] 09/04/2017  . Polymyalgia rheumatica ([place]) [M35.3] 06/09/2016  . Spondylosis of lumbar region without myelopathy or radiculopathy [M47.816] 06/09/2016  . DDD (degenerative disc disease), cervical [M50.30] 06/09/2016  . Primary osteoarthritis of both hands [M19.041, M19.042] 06/09/2016  . History of scoliosis [Z87.39] 06/09/2016  . Venous insufficiency [I87.2] 06/09/2016  . History of thalassemia [Z86.2] 06/09/2016  . History of trigeminal neuralgia [Z86.69] 06/09/2016  . History of diabetes mellitus [Z86.39] 06/09/2016  . Rash [R21] 09/08/2014  . ARF (acute renal failure) ([place]) [N17.9] 09/08/2014  . Venous stasis dermatitis of left lower extremity [I87.2] 09/08/2014  . Dehydration [E86.0] 09/08/2014  . Allergic reaction caused by a drug [T78.40XA] 09/08/2014  . Congestive dilated cardiomyopathy ([place]) [I42.0] 08/29/2014  . Chronic constipation [K59.09] 03/26/2012  . Aortic stenosis [I35.0] 03/01/2012  . Chronic venous hypertension without complications [Z99.357] 01/77/9390  . Murmur [R01.1] 02/24/2011  . Tobacco abuse [Z72.0] 02/24/2011  . Diabetes mellitus ([place]) [E11.9]   . HTN (hypertension) [I10]   . Hyperlipemia [E78.5]   . Spinal stenosis [M48.00]   . Thalassemia [D56.9]   . History of Lyme disease [Z86.19]    . PTSD (post-traumatic stress disorder) [F43.10]   . GERD [K21.9] 04/06/2009  . NAUSEA [R11.0] 04/06/2009  . FLATULENCE-GAS-BLOATING [R14.3, R14.1, R14.2] 04/06/2009  . ABDOMINAL PAIN-EPIGASTRIC [R10.13] 04/06/2009    Subjective: 84 yo male who presented to the ED with agitation from his SNF.  His behaviors started yesterday when he wanted to leave his room and when he could not, stopped by staff, he became aggressive.  When rules are strict, his behaviors increase but when they are relaxed, he is calm and cooperative.  No agitation in the ED.  No associative symptoms, U/A is clear.  No suicidal/homicidal ideations, hallucinations, or substance abuse.  PRN agitation medication ordered, stable to return.  Total Time spent with patient: 45 minutes  Past Psychiatric History:  Depression, PTSD  Past Medical History:  Past Medical History:  Diagnosis Date  . Anxiety   . Aortic stenosis   . Benign neoplasm of colon   . Cancer ([place])    left cheek  . Diabetes mellitus   . Diverticulosis of colon (without mention of hemorrhage) 02/03/2005  . Hemorrhoids 02/03/2005   Internal and external  . History of Lyme disease   . HTN (hypertension)   . Hyperlipemia   . Personal history of colonic polyps   . Phlebitis 06-24-2014   left leg  . Polymyalgia rheumatica ([place])   . PTSD (post-traumatic stress disorder)   . Spinal stenosis   . Stroke ([place])   . Thalassemia   . Varicose veins    Past Surgical History:  Procedure Laterality Date  . CATARACT EXTRACTION, BILATERAL    . HEMORROIDECTOMY    . KIDNEY STONE SURGERY     Family History:  Family History  Problem Relation Age of Onset  .  Diabetes Father   . Leukemia Mother   . Hypertension Mother   . Arthritis Mother   . Diabetes Sister    Family Psychiatric  History: none Social History:  Social History   Substance and Sexual Activity  Alcohol Use No  . Alcohol/week: 0.0 standard drinks    Social History   Substance and Sexual  Activity  Drug Use No   Social History   Socioeconomic History  . Marital status: Married    Spouse name: Not on file  . Number of children: 2  . Years of education: Not on file  . Highest education level: Not on file  Occupational History  . Occupation: retired Wellsite geologist  . Financial resource strain: Not on file  . Food insecurity:    Worry: Not on file    Inability: Not on file  . Transportation needs:    Medical: Not on file    Non-medical: Not on file  Tobacco Use  . Smoking status: Former Smoker    Years: 78.00    Types: Cigars  . Smokeless tobacco: Never Used  Substance and Sexual Activity  . Alcohol use: No    Alcohol/week: 0.0 standard drinks  . Drug use: No  . Sexual activity: Not on file  Lifestyle  . Physical activity:    Days per week: Not on file    Minutes per session: Not on file  . Stress: Not on file  Relationships  . Social connections:    Talks on phone: Not on file    Gets together: Not on file    Attends religious service: Not on file    Active member of club or organization: Not on file    Attends meetings of clubs or organizations: Not on file    Relationship status: Not on file  Other Topics Concern  . Not on file  Social History Narrative  . Not on file    Has this patient used any form of tobacco in the last 30 days? (Cigarettes, Smokeless Tobacco, Cigars, and/or Pipes) Denies  Current Medications: Current Facility-Administered Medications  Medication Dose Route Frequency Provider Last Rate Last Dose  . acetaminophen (TYLENOL) tablet 500 mg  500 mg Oral QID Curatolo, Adam, DO   500 mg at 11/09/17 0012  . aspirin EC tablet 81 mg  81 mg Oral Daily Curatolo, Adam, DO      . escitalopram (LEXAPRO) tablet 20 mg  20 mg Oral Daily Curatolo, Adam, DO      . glipiZIDE (GLUCOTROL) tablet 5 mg  5 mg Oral QAC breakfast Curatolo, Adam, DO   5 mg at 11/09/17 0851  . LORazepam (ATIVAN) tablet 0.5 mg  0.5 mg Oral BID Curatolo, Adam, DO    0.5 mg at 11/09/17 0012  . metFORMIN (GLUCOPHAGE) tablet 250 mg  250 mg Oral Q breakfast Curatolo, Adam, DO   250 mg at 11/09/17 0851  . OLANZapine (ZYPREXA) tablet 2.5 mg  2.5 mg Oral BID PRN Patrecia Pour, NP      . pantoprazole (PROTONIX) EC tablet 40 mg  40 mg Oral Daily Curatolo, Adam, DO      . sacubitril-valsartan (ENTRESTO) 24-26 mg per tablet  1 tablet Oral Daily Curatolo, Adam, DO      . spironolactone (ALDACTONE) tablet 12.5 mg  12.5 mg Oral Daily Curatolo, Adam, DO       Current Outpatient Medications  Medication Sig Dispense Refill  . acetaminophen (TYLENOL) 500 MG tablet Take 500 mg by  mouth 4 (four) times daily.    Marland Kitchen aspirin 81 MG tablet Take 1 tablet (81 mg total) by mouth daily. 30 tablet 1  . escitalopram (LEXAPRO) 20 MG tablet Take 20 mg by mouth daily.    Marland Kitchen glipiZIDE (GLUCOTROL) 5 MG tablet Take 5 mg by mouth daily before breakfast.     . HYDROcodone-acetaminophen (NORCO) 5-325 MG tablet Take 0.5-1 tablets by mouth every 6 (six) hours as needed. (Patient taking differently: Take 0.5-1 tablets by mouth every 6 (six) hours as needed for moderate pain or severe pain. ) 15 tablet 0  . LORazepam (ATIVAN) 0.5 MG tablet Take 0.5 mg by mouth 2 (two) times daily.     . magnesium oxide (MAG-OX) 400 MG tablet Take 400 mg by mouth daily.    . Menthol, Topical Analgesic, (BIOFREEZE COLORLESS) 4 % GEL Apply 1 application topically 3 (three) times daily.    . metFORMIN (GLUCOPHAGE) 500 MG tablet Take 250 mg by mouth daily with breakfast.     . Multiple Vitamins-Minerals (PRESERVISION AREDS 2 PO) Take 1 tablet by mouth 2 (two) times daily.    . pantoprazole (PROTONIX) 40 MG tablet Take 1 tablet by mouth every morning. (Patient taking differently: Take 40 mg by mouth daily. ) 30 tablet 11  . sacubitril-valsartan (ENTRESTO) 24-26 MG Take 1 tablet by mouth 2 (two) times daily. (Patient taking differently: Take 1 tablet by mouth daily. ) 60 tablet 2  . spironolactone (ALDACTONE) 25 MG tablet  Take 0.5 tablets (12.5 mg total) by mouth daily. 15 tablet 3  . vitamin B-12 (CYANOCOBALAMIN) 1000 MCG tablet Take 1 tablet (1,000 mcg total) by mouth daily. 30 tablet 0  . furosemide (LASIX) 20 MG tablet Take 1 tablet (20 mg total) by mouth daily for 7 days. 7 tablet 0  . metoprolol succinate (TOPROL-XL) 25 MG 24 hr tablet Take 1 tablet (25 mg total) by mouth daily. (Patient not taking: Reported on 10/28/2017) 30 tablet 0  . predniSONE (DELTASONE) 5 MG tablet TAKE 1 TABLET(5 MG) BY MOUTH DAILY WITH BREAKFAST (Patient not taking: No sig reported) 90 tablet 0  . traMADol (ULTRAM) 50 MG tablet Take 1 tablet (50 mg total) by mouth every 6 (six) hours as needed. (Patient not taking: Reported on 10/28/2017) 15 tablet 0  . traZODone (DESYREL) 50 MG tablet Take 1 tablet (50 mg total) by mouth at bedtime. (Patient not taking: Reported on 10/28/2017) 30 tablet 0   PTA Medications:  (Not in a hospital admission)  Musculoskeletal: Strength & Muscle Tone: within normal limits Gait & Station: normal Patient leans: N/A  Psychiatric Specialty Exam: Physical Exam  Nursing note and vitals reviewed. Constitutional: He is oriented to person, place, and time. He appears well-developed and well-nourished.  HENT:  Head: Normocephalic.  Neck: Normal range of motion.  Respiratory: Effort normal.  Musculoskeletal: Normal range of motion.  Neurological: He is alert and oriented to person, place, and time.  Psychiatric: He has a normal mood and affect. His speech is normal and behavior is normal. Judgment and thought content normal. Cognition and memory are normal.    Review of Systems  Psychiatric/Behavioral: Positive for memory loss.  All other systems reviewed and are negative.   Blood pressure (!) 160/83, pulse 73, temperature (!) 97.5 F (36.4 C), temperature source Oral, resp. rate 15, SpO2 94 %.There is no height or weight on file to calculate BMI.  General Appearance: Casual  Eye Contact:  Good   Speech:  Normal Rate  Volume:  Normal  Mood:  Euthymic  Affect:  Congruent  Thought Process:  Coherent and Descriptions of Associations: Intact  Orientation:  Full (Time, Place, and Person)  Thought Content:  WDL and Logical  Suicidal Thoughts:  No  Homicidal Thoughts:  No  Memory:  Immediate;   Fair Recent;   Fair Remote;   Fair  Judgement:  Fair  Insight:  Fair  Psychomotor Activity:  Normal  Concentration:  Concentration: Good and Attention Span: Good  Recall:  [employer] of Knowledge:  Fair  Language:  Good  Akathisia:  No  Handed:  Right  AIMS (if indicated):   N/A  Assets:  Housing Leisure Time Resilience Social Support  ADL's:  Impaired  Cognition:  Impaired,  Mild  Sleep:   N/A     Demographic Factors:  Male, Age 80 or older and Caucasian  Loss Factors: NA  Historical Factors: Impulsivity  Risk Reduction Factors:   Sense of responsibility to family, Living with another person, especially a relative, Positive social support and Positive therapeutic relationship  Continued Clinical Symptoms:  None   Cognitive Features That Contribute To Risk:  None    Suicide Risk:  Minimal: No identifiable suicidal ideation.  Patients presenting with no risk factors but with morbid ruminations; may be classified as minimal risk based on the severity of the depressive symptoms    Plan Of Care/Follow-up recommendations:  Activity:  as tolerated Diet:  heart healhty diet  Disposition: discharge back to SNF  Waylan Boga, NP 11/09/2017, 10:05 AM   Patient seen face-to-face for psychiatric evaluation, chart reviewed and case discussed with the physician extender and developed treatment plan. Reviewed the information documented and agree with the treatment plan.  Buford Dresser, DO 11/09/17 10:29 PM

## 2017-11-09 NOTE — BH Assessment (Signed)
[facility] Assessment Progress Note  Per Buford Dresser, DO, this pt does not require psychiatric hospitalization at this time.  Pt is to be discharged from [facility].  Ollen Barges, LCSW agrees to facilitate pt's return to his residential facility.  No further discharge instructions are required.  Pt's nurse has been notified.  Jalene Mullet, [place] Triage Specialist [phone]

## 2017-11-09 NOTE — Progress Notes (Addendum)
CSW aware that patient has been psychiatrically cleared and is ready for discharge. Patient is from [facility]. CSW spoke with Juliann Pulse, of [facility] ph: [phone], regarding patient's disposition plan. Juliann Pulse expressed concerns with the behaviors he's been having. CSW updated Juliann Pulse that patient was started on a PRN medication for his agitation. Per Juliann Pulse, he is able to return. CSW advised by Juliann Pulse to reach out to family for transportation assistance. CSW contacted son, Vegas Fritze ph: [phone], to update him of disposition plan and coordinate transportation. Per Iona Beard, he would make some calls for transportation and call CSW back. CSW will await return call.  11:18am- CSW spoke with Iona Beard, who reports that he would be by to pick up patient within the hour. CSW to update RN of pick up time. Per Iona Beard, he would call CSW back when he was 15-20 minutes away.   11:28am- CSW informed by RN that she called for [facility] to pick up patient and transport to facility. CSW updated son of transportation change.   Ollen Barges, [place] Work Department  [employer]  [phone]

## 2017-11-09 NOTE — Progress Notes (Signed)
CSW received handoff from 2nd shift ED CSW. CSW aware patient is from [facility]. Per notes, [facility] informed patient's son that patient had become "too much" and mentioned patient "not being able to return". As per previous note from 2nd shift ED CSW, facility must provide documentation of aggression in the form of a filed charge with [employer] or give a 30-day notice before they can refuse patient. CSW to follow up with facility this morning once disposition plan has been decided by psychiatry team. CSW will continue to follow.  Ollen Barges, [place] Work Department  [employer]  [phone]

## 2017-11-09 NOTE — ED Notes (Addendum)
Pt is pocketing food in mouth and spitting out at staff. Also placed condom cath to obtain urine sample. Will continue to monitor.

## 2017-11-13 ENCOUNTER — Ambulatory Visit: Payer: Medicare Other | Admitting: Cardiology

## 2017-11-13 ENCOUNTER — Telehealth: Payer: Self-pay | Admitting: Cardiology

## 2017-11-13 NOTE — Telephone Encounter (Signed)
Spoke with pt dtr, Aware of dr crenshaw's recommendations.  

## 2017-11-13 NOTE — Telephone Encounter (Signed)
Returned call to patient's daughter Minus Liberty.She wanted to let Dr.Crenshaw know father's health is declining.Stated he is confused,not eating.She cancelled appointment this morning with Dr.Crenshaw.Stated his heart seems stable.She wanted to ask Dr.Crenshaw if he needs any further heart test.Message sent to [facility].

## 2017-11-13 NOTE — Telephone Encounter (Signed)
Would not pursue further testing if pt is deteriorating as outlined Antonio Buchanan

## 2017-11-13 NOTE — Telephone Encounter (Signed)
° °  Patient daughter requesting call from nurse to discuss decline in health

## 2017-11-14 DIAGNOSIS — I509 Heart failure, unspecified: Secondary | ICD-10-CM | POA: Diagnosis not present

## 2017-11-14 DIAGNOSIS — I672 Cerebral atherosclerosis: Secondary | ICD-10-CM | POA: Diagnosis not present

## 2017-11-14 DIAGNOSIS — I359 Nonrheumatic aortic valve disorder, unspecified: Secondary | ICD-10-CM | POA: Diagnosis not present

## 2017-11-14 DIAGNOSIS — K219 Gastro-esophageal reflux disease without esophagitis: Secondary | ICD-10-CM | POA: Diagnosis not present

## 2017-11-14 DIAGNOSIS — F0150 Vascular dementia without behavioral disturbance: Secondary | ICD-10-CM | POA: Diagnosis not present

## 2017-11-14 DIAGNOSIS — I10 Essential (primary) hypertension: Secondary | ICD-10-CM | POA: Diagnosis not present

## 2017-11-14 DIAGNOSIS — F339 Major depressive disorder, recurrent, unspecified: Secondary | ICD-10-CM | POA: Diagnosis not present

## 2017-11-14 DIAGNOSIS — N189 Chronic kidney disease, unspecified: Secondary | ICD-10-CM | POA: Diagnosis not present

## 2017-11-14 DIAGNOSIS — I679 Cerebrovascular disease, unspecified: Secondary | ICD-10-CM | POA: Diagnosis not present

## 2017-11-14 DIAGNOSIS — E1159 Type 2 diabetes mellitus with other circulatory complications: Secondary | ICD-10-CM | POA: Diagnosis not present

## 2017-11-14 DIAGNOSIS — I739 Peripheral vascular disease, unspecified: Secondary | ICD-10-CM | POA: Diagnosis not present

## 2017-11-15 DIAGNOSIS — I679 Cerebrovascular disease, unspecified: Secondary | ICD-10-CM | POA: Diagnosis not present

## 2017-11-15 DIAGNOSIS — F0150 Vascular dementia without behavioral disturbance: Secondary | ICD-10-CM | POA: Diagnosis not present

## 2017-11-15 DIAGNOSIS — N189 Chronic kidney disease, unspecified: Secondary | ICD-10-CM | POA: Diagnosis not present

## 2017-11-15 DIAGNOSIS — I509 Heart failure, unspecified: Secondary | ICD-10-CM | POA: Diagnosis not present

## 2017-11-15 DIAGNOSIS — I10 Essential (primary) hypertension: Secondary | ICD-10-CM | POA: Diagnosis not present

## 2017-11-15 DIAGNOSIS — I672 Cerebral atherosclerosis: Secondary | ICD-10-CM | POA: Diagnosis not present

## 2017-11-17 DIAGNOSIS — I672 Cerebral atherosclerosis: Secondary | ICD-10-CM | POA: Diagnosis not present

## 2017-11-17 DIAGNOSIS — I509 Heart failure, unspecified: Secondary | ICD-10-CM | POA: Diagnosis not present

## 2017-11-17 DIAGNOSIS — I679 Cerebrovascular disease, unspecified: Secondary | ICD-10-CM | POA: Diagnosis not present

## 2017-11-17 DIAGNOSIS — I10 Essential (primary) hypertension: Secondary | ICD-10-CM | POA: Diagnosis not present

## 2017-11-17 DIAGNOSIS — N189 Chronic kidney disease, unspecified: Secondary | ICD-10-CM | POA: Diagnosis not present

## 2017-11-17 DIAGNOSIS — F0150 Vascular dementia without behavioral disturbance: Secondary | ICD-10-CM | POA: Diagnosis not present

## 2017-11-19 DIAGNOSIS — I359 Nonrheumatic aortic valve disorder, unspecified: Secondary | ICD-10-CM | POA: Diagnosis not present

## 2017-11-19 DIAGNOSIS — F0150 Vascular dementia without behavioral disturbance: Secondary | ICD-10-CM | POA: Diagnosis not present

## 2017-11-19 DIAGNOSIS — I739 Peripheral vascular disease, unspecified: Secondary | ICD-10-CM | POA: Diagnosis not present

## 2017-11-19 DIAGNOSIS — E1159 Type 2 diabetes mellitus with other circulatory complications: Secondary | ICD-10-CM | POA: Diagnosis not present

## 2017-11-19 DIAGNOSIS — F339 Major depressive disorder, recurrent, unspecified: Secondary | ICD-10-CM | POA: Diagnosis not present

## 2017-11-19 DIAGNOSIS — I10 Essential (primary) hypertension: Secondary | ICD-10-CM | POA: Diagnosis not present

## 2017-11-19 DIAGNOSIS — I679 Cerebrovascular disease, unspecified: Secondary | ICD-10-CM | POA: Diagnosis not present

## 2017-11-19 DIAGNOSIS — N189 Chronic kidney disease, unspecified: Secondary | ICD-10-CM | POA: Diagnosis not present

## 2017-11-19 DIAGNOSIS — I672 Cerebral atherosclerosis: Secondary | ICD-10-CM | POA: Diagnosis not present

## 2017-11-19 DIAGNOSIS — K219 Gastro-esophageal reflux disease without esophagitis: Secondary | ICD-10-CM | POA: Diagnosis not present

## 2017-11-19 DIAGNOSIS — I509 Heart failure, unspecified: Secondary | ICD-10-CM | POA: Diagnosis not present

## 2017-11-21 DIAGNOSIS — I672 Cerebral atherosclerosis: Secondary | ICD-10-CM | POA: Diagnosis not present

## 2017-11-21 DIAGNOSIS — I679 Cerebrovascular disease, unspecified: Secondary | ICD-10-CM | POA: Diagnosis not present

## 2017-11-21 DIAGNOSIS — I10 Essential (primary) hypertension: Secondary | ICD-10-CM | POA: Diagnosis not present

## 2017-11-21 DIAGNOSIS — N189 Chronic kidney disease, unspecified: Secondary | ICD-10-CM | POA: Diagnosis not present

## 2017-11-21 DIAGNOSIS — I509 Heart failure, unspecified: Secondary | ICD-10-CM | POA: Diagnosis not present

## 2017-11-21 DIAGNOSIS — F0150 Vascular dementia without behavioral disturbance: Secondary | ICD-10-CM | POA: Diagnosis not present

## 2017-11-22 DIAGNOSIS — I672 Cerebral atherosclerosis: Secondary | ICD-10-CM | POA: Diagnosis not present

## 2017-11-22 DIAGNOSIS — I10 Essential (primary) hypertension: Secondary | ICD-10-CM | POA: Diagnosis not present

## 2017-11-22 DIAGNOSIS — I509 Heart failure, unspecified: Secondary | ICD-10-CM | POA: Diagnosis not present

## 2017-11-22 DIAGNOSIS — N189 Chronic kidney disease, unspecified: Secondary | ICD-10-CM | POA: Diagnosis not present

## 2017-11-22 DIAGNOSIS — F0150 Vascular dementia without behavioral disturbance: Secondary | ICD-10-CM | POA: Diagnosis not present

## 2017-11-22 DIAGNOSIS — I679 Cerebrovascular disease, unspecified: Secondary | ICD-10-CM | POA: Diagnosis not present

## 2017-11-26 ENCOUNTER — Emergency Department (HOSPITAL_COMMUNITY)
Admission: EM | Admit: 2017-11-26 | Discharge: 2017-11-26 | Disposition: A | Discharge status: Home / Self Care | Attending: Emergency Medicine | Admitting: Emergency Medicine

## 2017-11-26 ENCOUNTER — Emergency Department (HOSPITAL_COMMUNITY)

## 2017-11-26 DIAGNOSIS — Y999 Unspecified external cause status: Secondary | ICD-10-CM | POA: Insufficient documentation

## 2017-11-26 DIAGNOSIS — Z8673 Personal history of transient ischemic attack (TIA), and cerebral infarction without residual deficits: Secondary | ICD-10-CM | POA: Diagnosis not present

## 2017-11-26 DIAGNOSIS — Y939 Activity, unspecified: Secondary | ICD-10-CM | POA: Insufficient documentation

## 2017-11-26 DIAGNOSIS — W1830XA Fall on same level, unspecified, initial encounter: Secondary | ICD-10-CM | POA: Insufficient documentation

## 2017-11-26 DIAGNOSIS — I509 Heart failure, unspecified: Secondary | ICD-10-CM | POA: Diagnosis not present

## 2017-11-26 DIAGNOSIS — S01111A Laceration without foreign body of right eyelid and periocular area, initial encounter: Secondary | ICD-10-CM | POA: Diagnosis not present

## 2017-11-26 DIAGNOSIS — Y92129 Unspecified place in nursing home as the place of occurrence of the external cause: Secondary | ICD-10-CM | POA: Diagnosis not present

## 2017-11-26 DIAGNOSIS — I491 Atrial premature depolarization: Secondary | ICD-10-CM | POA: Diagnosis not present

## 2017-11-26 DIAGNOSIS — W19XXXA Unspecified fall, initial encounter: Secondary | ICD-10-CM

## 2017-11-26 DIAGNOSIS — Z79899 Other long term (current) drug therapy: Secondary | ICD-10-CM | POA: Diagnosis not present

## 2017-11-26 DIAGNOSIS — R404 Transient alteration of awareness: Secondary | ICD-10-CM | POA: Diagnosis not present

## 2017-11-26 DIAGNOSIS — Z7984 Long term (current) use of oral hypoglycemic drugs: Secondary | ICD-10-CM | POA: Diagnosis not present

## 2017-11-26 DIAGNOSIS — I672 Cerebral atherosclerosis: Secondary | ICD-10-CM | POA: Diagnosis not present

## 2017-11-26 DIAGNOSIS — M2550 Pain in unspecified joint: Secondary | ICD-10-CM | POA: Diagnosis not present

## 2017-11-26 DIAGNOSIS — Z7982 Long term (current) use of aspirin: Secondary | ICD-10-CM | POA: Diagnosis not present

## 2017-11-26 DIAGNOSIS — R410 Disorientation, unspecified: Secondary | ICD-10-CM | POA: Diagnosis not present

## 2017-11-26 DIAGNOSIS — E119 Type 2 diabetes mellitus without complications: Secondary | ICD-10-CM | POA: Insufficient documentation

## 2017-11-26 DIAGNOSIS — I679 Cerebrovascular disease, unspecified: Secondary | ICD-10-CM | POA: Diagnosis not present

## 2017-11-26 DIAGNOSIS — Z85818 Personal history of malignant neoplasm of other sites of lip, oral cavity, and pharynx: Secondary | ICD-10-CM | POA: Diagnosis not present

## 2017-11-26 DIAGNOSIS — Z7401 Bed confinement status: Secondary | ICD-10-CM | POA: Diagnosis not present

## 2017-11-26 DIAGNOSIS — F0390 Unspecified dementia without behavioral disturbance: Secondary | ICD-10-CM | POA: Insufficient documentation

## 2017-11-26 DIAGNOSIS — I10 Essential (primary) hypertension: Secondary | ICD-10-CM | POA: Diagnosis not present

## 2017-11-26 DIAGNOSIS — S0511XA Contusion of eyeball and orbital tissues, right eye, initial encounter: Secondary | ICD-10-CM | POA: Diagnosis not present

## 2017-11-26 DIAGNOSIS — N189 Chronic kidney disease, unspecified: Secondary | ICD-10-CM | POA: Diagnosis not present

## 2017-11-26 DIAGNOSIS — S80211A Abrasion, right knee, initial encounter: Secondary | ICD-10-CM | POA: Insufficient documentation

## 2017-11-26 DIAGNOSIS — F0150 Vascular dementia without behavioral disturbance: Secondary | ICD-10-CM | POA: Diagnosis not present

## 2017-11-26 DIAGNOSIS — I959 Hypotension, unspecified: Secondary | ICD-10-CM | POA: Diagnosis not present

## 2017-11-26 NOTE — ED Provider Notes (Signed)
Medical screening examination/treatment/procedure(s) were conducted as a shared visit with non-physician practitioner(s) and myself.  I personally evaluated the patient during the encounter.  Clinical Impression:   Final diagnoses:  Fall, initial encounter   From a SNF  The patient is an elderly 84 year old male who had a fall, he has dementia, he has a DO NOT RESUSCITATE order.  On exam he does have a hematoma just above and lateral to his right eye, there does appear to be a very small laceration, there is no bony tenderness around the orbital rim, otherwise the patient is in no distress.  No other signs of injury, CT scans reviewed, no signs of intracranial or spinal injury.  Primary repair, anticipate discharge.   Noemi Chapel, MD 11/27/17 [phone]

## 2017-11-26 NOTE — ED Triage Notes (Addendum)
Pt arrived via [facility] from [facility] after pt was found outside of facility by staff bleeding from head. Pt on plavix. Pt is alert to self only, has hx of dementia. Pt is c/o pain but unable to tell RN where pain is located. Initial bp 86/68, 530mL Ns given, bp increased to 94/78. Pt is wheelchair bound but was able to exit facility without chair. Hr 90, Sp02 99%ra, cbg 145. **HOSPICE PT**, DNR at bedside.

## 2017-11-26 NOTE — ED Provider Notes (Signed)
[facility] EMERGENCY DEPARTMENT Provider Note  CSN: [id number] Arrival date & time: 11/26/17  1519    History   Chief Complaint Chief Complaint  Patient presents with  . Fall    HPI Antonio Buchanan is a 84 y.o. male with a medical history of dementia, stroke, HTN, Type 2 DM, PMR and cancer who presented to the ED from his ALF following an unwitnessed fall approx. 1 hour before arrival to the ED. Patient states he fell on to concrete, but is not sure how. Patient currently does not endorse any pain, but states he has bruising on his right knee and right elbow. History limited due to patient's dementia.  Additional history obtained from ALF staff. Staff state that patient was found on the ground out of his wheelchair with blood on his face and head. At baseline, patient is only oriented to self. Unknown LOC, but no acute changes in patient's behavior or mentation from baseline. Patient is on anticoagulant.  Past Medical History:  Diagnosis Date  . Anxiety   . Aortic stenosis   . Benign neoplasm of colon   . Cancer ([place])    left cheek  . Diabetes mellitus   . Diverticulosis of colon (without mention of hemorrhage) 02/03/2005  . Hemorrhoids 02/03/2005   Internal and external  . History of Lyme disease   . HTN (hypertension)   . Hyperlipemia   . Personal history of colonic polyps   . Phlebitis 06-24-2014   left leg  . Polymyalgia rheumatica ([place])   . PTSD (post-traumatic stress disorder)   . Spinal stenosis   . Stroke ([place])   . Thalassemia   . Varicose veins     Patient Active Problem List   Diagnosis Date Noted  . Adjustment disorder with mixed disturbance of emotions and conduct 11/09/2017  . Acute on chronic congestive heart failure ([place])   . Multiple closed fractures of ribs of right side   . Primary osteoarthritis of left knee 09/10/2017  . Acute arterial ischemic stroke, vertebrobasilar, cerebellar ([place]) 09/04/2017  . Delirium 09/04/2017    . Ataxic gait 09/04/2017  . Polymyalgia rheumatica ([place]) 06/09/2016  . Spondylosis of lumbar region without myelopathy or radiculopathy 06/09/2016  . DDD (degenerative disc disease), cervical 06/09/2016  . Primary osteoarthritis of both hands 06/09/2016  . History of scoliosis 06/09/2016  . Venous insufficiency 06/09/2016  . History of thalassemia 06/09/2016  . History of trigeminal neuralgia 06/09/2016  . History of diabetes mellitus 06/09/2016  . Rash 09/08/2014  . ARF (acute renal failure) ([place]) 09/08/2014  . Venous stasis dermatitis of left lower extremity 09/08/2014  . Dehydration 09/08/2014  . Allergic reaction caused by a drug 09/08/2014  . Congestive dilated cardiomyopathy ([place]) 08/29/2014  . Chronic constipation 03/26/2012  . Aortic stenosis 03/01/2012  . Chronic venous hypertension without complications 28/31/5176  . Murmur 02/24/2011  . Tobacco abuse 02/24/2011  . Diabetes mellitus ([place])   . HTN (hypertension)   . Hyperlipemia   . Spinal stenosis   . Thalassemia   . History of Lyme disease   . PTSD (post-traumatic stress disorder)   . GERD 04/06/2009  . NAUSEA 04/06/2009  . FLATULENCE-GAS-BLOATING 04/06/2009  . ABDOMINAL PAIN-EPIGASTRIC 04/06/2009    Past Surgical History:  Procedure Laterality Date  . CATARACT EXTRACTION, BILATERAL    . HEMORROIDECTOMY    . KIDNEY STONE SURGERY          Home Medications    Prior to Admission medications   Medication Sig  Start Date End Date Taking? Authorizing Provider  acetaminophen (TYLENOL) 500 MG tablet Take 500 mg by mouth 4 (four) times daily.   Yes [provider]  aspirin 81 MG tablet Take 1 tablet (81 mg total) by mouth daily. 10/06/17  Yes Sethi, Lucy [place], MD  ENSURE (ENSURE) Take 237 mLs by mouth 2 (two) times daily.   Yes [provider]  escitalopram (LEXAPRO) 20 MG tablet Take 20 mg by mouth daily.   Yes [provider]  glipiZIDE (GLUCOTROL) 5 MG tablet Take 5 mg by mouth daily  before breakfast.    Yes [provider]  HYDROcodone-acetaminophen (NORCO) 5-325 MG tablet Take 0.5-1 tablets by mouth every 6 (six) hours as needed. Patient taking differently: Take 0.5 tablets by mouth every 6 (six) hours as needed for moderate pain or severe pain (Not to exceed 2 tablets in 24 hours.).  10/28/17  Yes Sherwood Gambler, MD  LORazepam (ATIVAN) 0.5 MG tablet Take 0.5 mg by mouth 2 (two) times daily.    Yes [provider]  magnesium oxide (MAG-OX) 400 MG tablet Take 400 mg by mouth daily.   Yes [provider]  metFORMIN (GLUCOPHAGE) 500 MG tablet Take 250 mg by mouth daily with breakfast.    Yes [provider]  Multiple Vitamins-Minerals (PRESERVISION AREDS 2 PO) Take 1 tablet by mouth 2 (two) times daily.   Yes [provider]  OLANZapine (ZYPREXA) 2.5 MG tablet Take 1 tablet (2.5 mg total) by mouth 2 (two) times daily as needed (agitation). Patient taking differently: Take 2.5 mg by mouth every 12 (twelve) hours as needed (agitation).  11/09/17  Yes Patrecia Pour, NP  pantoprazole (PROTONIX) 40 MG tablet Take 1 tablet by mouth every morning. Patient taking differently: Take 40 mg by mouth daily.  06/21/17  Yes Esterwood, Amy S, PA-C  sacubitril-valsartan (ENTRESTO) 24-26 MG Take 1 tablet by mouth 2 (two) times daily. Patient taking differently: Take 1 tablet by mouth daily.  08/07/17  Yes Lendon Colonel, NP  spironolactone (ALDACTONE) 25 MG tablet Take 0.5 tablets (12.5 mg total) by mouth daily. 08/07/17  Yes Lendon Colonel, NP  vitamin B-12 (CYANOCOBALAMIN) 1000 MCG tablet Take 1 tablet (1,000 mcg total) by mouth daily. 09/05/17  Yes Alma Friendly, MD  furosemide (LASIX) 20 MG tablet Take 1 tablet (20 mg total) by mouth daily for 7 days. 09/18/17 10/06/17  Doreatha Lew, MD  metoprolol succinate (TOPROL-XL) 25 MG 24 hr tablet Take 1 tablet (25 mg total) by mouth daily. Patient not taking: Reported on 10/28/2017 09/05/17    Alma Friendly, MD  predniSONE (DELTASONE) 5 MG tablet TAKE 1 TABLET(5 MG) BY MOUTH DAILY WITH BREAKFAST Patient not taking: No sig reported 09/11/17   Bo Merino, MD  traMADol (ULTRAM) 50 MG tablet Take 1 tablet (50 mg total) by mouth every 6 (six) hours as needed. Patient not taking: Reported on 10/28/2017 10/11/17   Dorie Rank, MD  traZODone (DESYREL) 50 MG tablet Take 1 tablet (50 mg total) by mouth at bedtime. Patient not taking: Reported on 10/28/2017 09/18/17   Doreatha Lew, MD    Family History Family History  Problem Relation Age of Onset  . Diabetes Father   . Leukemia Mother   . Hypertension Mother   . Arthritis Mother   . Diabetes Sister     Social History Social History   Tobacco Use  . Smoking status: Former Smoker    Years: 78.00  Types: Cigars  . Smokeless tobacco: Never Used  Substance Use Topics  . Alcohol use: No    Alcohol/week: 0.0 standard drinks  . Drug use: No     Allergies   Cephalosporins and Lamisil af defense [tolnaftate]   Review of Systems Review of Systems  Unable to perform ROS: Dementia     Physical Exam Updated Vital Signs BP 125/77   Pulse 74   Resp (!) 21   SpO2 (!) 81%   Physical Exam  Constitutional: Vital signs are normal. He is cooperative.  Thin. Chronic ill appearance.  HENT:  Head: Normocephalic and atraumatic. Head is without abrasion.    Mouth/Throat: Uvula is midline, oropharynx is clear and moist and mucous membranes are normal.  Dried blood above eyebrow and in his hair. No lacerations or open wounds seen.   Eyes: Pupils are equal, round, and reactive to light. Conjunctivae, EOM and lids are normal.  Neck: Normal range of motion and full passive range of motion without pain. Neck supple. No spinous process tenderness and no muscular tenderness present. No neck rigidity. Normal range of motion present.  Cardiovascular: Normal rate, regular rhythm and normal heart sounds.  No murmur  heard. Pulmonary/Chest: Effort normal and breath sounds normal.  Abdominal: Soft. Normal appearance and bowel sounds are normal. There is no tenderness.  Neurological: He is alert. He is disoriented. He displays no atrophy. No cranial nerve deficit or sensory deficit. He exhibits normal muscle tone. GCS eye subscore is 4. GCS verbal subscore is 5. GCS motor subscore is 6.  Skin: Skin is warm. Capillary refill takes 2 to 3 seconds. Abrasion noted.     Psychiatric: Thought content is delusional. Cognition and memory are impaired.  Nursing note and vitals reviewed.    ED Treatments / Results  Labs (all labs ordered are listed, but only abnormal results are displayed) Labs Reviewed - No data to display  EKG None  Radiology Ct Head Wo Contrast  Result Date: 11/26/2017 CLINICAL DATA:  Syncope, fall striking head. Active bleeding along the right side of the head. Anti coagulation. EXAM: CT HEAD WITHOUT CONTRAST CT CERVICAL SPINE WITHOUT CONTRAST TECHNIQUE: Multidetector CT imaging of the head and cervical spine was performed following the standard protocol without intravenous contrast. Multiplanar CT image reconstructions of the cervical spine were also generated. COMPARISON:  Multiple exams, including 09/03/2017 and MRI from 09/04/2017 FINDINGS: CT HEAD FINDINGS Brain: Prior and now remote lacunar infarct in the left cerebellum measuring 1.0 by 0.4 cm on image 6/4. Faint calcification in the globus pallidus nuclei bilaterally. Periventricular white matter and corona radiata hypodensities favor chronic ischemic microvascular white matter disease. No intracranial hemorrhage, mass lesion, or acute CVA. Vascular: There is atherosclerotic calcification of the cavernous carotid arteries bilaterally. Skull: Unremarkable Sinuses/Orbits: Chronic left maxillary, right sphenoid, and bilateral ethmoid sinusitis. Left mastoid effusion. Other: No supplemental non-categorized findings. CT CERVICAL SPINE FINDINGS  Despite efforts by the technologist and patient, motion artifact is present on today's exam and could not be eliminated. This reduces exam sensitivity and specificity. Alignment: 2 mm degenerative chronic retrolisthesis at C5-6. 1.5 mm degenerative chronic anterolisthesis at C7-T1. Skull base and vertebrae: Accounting for the underlying motion artifact, no discrete cervical spine fracture is identified. Soft tissues and spinal canal: Common carotid atherosclerotic calcifications. Disc levels: Osseous foraminal stenosis is present bilaterally C3-4, C4-5, and C5-6 due to uncinate and facet spurring. Upper chest: Branch vessel atherosclerotic calcification. Other: No supplemental non-categorized findings. IMPRESSION: 1. No acute intracranial findings or  acute cervical spine findings. 2. Cervical spondylosis and degenerative disc disease, causing foraminal impingement at C3-4, C4-5, and C5-6. 3. Mild chronic paranasal sinusitis.  Left mastoid effusion. 4. Periventricular white matter and corona radiata hypodensities favor chronic ischemic microvascular white matter disease. Small remote lacunar infarct in the left cerebellum. 5. Atherosclerosis. Electronically Signed   By: Van Clines M.D.   On: 11/26/2017 16:40   Ct Cervical Spine Wo Contrast  Result Date: 11/26/2017 CLINICAL DATA:  Syncope, fall striking head. Active bleeding along the right side of the head. Anti coagulation. EXAM: CT HEAD WITHOUT CONTRAST CT CERVICAL SPINE WITHOUT CONTRAST TECHNIQUE: Multidetector CT imaging of the head and cervical spine was performed following the standard protocol without intravenous contrast. Multiplanar CT image reconstructions of the cervical spine were also generated. COMPARISON:  Multiple exams, including 09/03/2017 and MRI from 09/04/2017 FINDINGS: CT HEAD FINDINGS Brain: Prior and now remote lacunar infarct in the left cerebellum measuring 1.0 by 0.4 cm on image 6/4. Faint calcification in the globus pallidus  nuclei bilaterally. Periventricular white matter and corona radiata hypodensities favor chronic ischemic microvascular white matter disease. No intracranial hemorrhage, mass lesion, or acute CVA. Vascular: There is atherosclerotic calcification of the cavernous carotid arteries bilaterally. Skull: Unremarkable Sinuses/Orbits: Chronic left maxillary, right sphenoid, and bilateral ethmoid sinusitis. Left mastoid effusion. Other: No supplemental non-categorized findings. CT CERVICAL SPINE FINDINGS Despite efforts by the technologist and patient, motion artifact is present on today's exam and could not be eliminated. This reduces exam sensitivity and specificity. Alignment: 2 mm degenerative chronic retrolisthesis at C5-6. 1.5 mm degenerative chronic anterolisthesis at C7-T1. Skull base and vertebrae: Accounting for the underlying motion artifact, no discrete cervical spine fracture is identified. Soft tissues and spinal canal: Common carotid atherosclerotic calcifications. Disc levels: Osseous foraminal stenosis is present bilaterally C3-4, C4-5, and C5-6 due to uncinate and facet spurring. Upper chest: Branch vessel atherosclerotic calcification. Other: No supplemental non-categorized findings. IMPRESSION: 1. No acute intracranial findings or acute cervical spine findings. 2. Cervical spondylosis and degenerative disc disease, causing foraminal impingement at C3-4, C4-5, and C5-6. 3. Mild chronic paranasal sinusitis.  Left mastoid effusion. 4. Periventricular white matter and corona radiata hypodensities favor chronic ischemic microvascular white matter disease. Small remote lacunar infarct in the left cerebellum. 5. Atherosclerosis. Electronically Signed   By: Van Clines M.D.   On: 11/26/2017 16:40    Procedures Procedures (including critical care time)  Medications Ordered in ED Medications - No data to display   Initial Impression / Assessment and Plan / ED Course  Triage vital signs and the  nursing notes have been reviewed.  Pertinent labs & imaging results that were available during care of the patient were reviewed and considered in medical decision making (see chart for details).  Patient with a history of dementia and currently taking an anticoagulant presents from an ALF for an unwitnessed fall. LOC was unknown, but patient had dried blood on his face and in his hair from a minor laceration above his eyebrow. Laceration is not deep enough to require repair with sutures, but was sealed with Dermabond. There are no open skull wounds, abrasions, bruisings or depressions. Superficial abrasion on right knee, but remaining physical exam is normal and patient does not endorse pain. No focal neuro deficits or changes in patient's baseline behaviors or mentation. Will order head and c-spine imaging to evaluate for injuries.  Clinical Course as of Nov 27 1811  Sun Nov 26, 2017  1706 CT head and c-spine show no  acute abnormalities. Chronic cerebral and small vessel disease change seen.   [GM]    Clinical Course User Index [GM] Riona Lahti, Jonelle Sports, PA-C   Final Clinical Impressions(s) / ED Diagnoses   Dispo: Home. After thorough clinical evaluation, this patient is determined to be medically stable and can be safely discharged with the previously mentioned treatment and/or outpatient follow-up/referral(s). At this time, there are no other apparent medical conditions that require further screening, evaluation or treatment.   Final diagnoses:  Fall, initial encounter    ED Discharge Orders    None        Junita Push 11/26/17 1813    Noemi Chapel, MD 11/27/17 [phone]

## 2017-11-26 NOTE — Discharge Instructions (Addendum)
Head and neck CT scans were negative and did not show any brain bleeding or fractures.

## 2017-11-26 NOTE — ED Notes (Signed)
Report called to [facility]. Report given to Darlen Round, RN at facility. Discharge information relayed to facility and paperwork will be given to transport agency upon discharge.

## 2017-11-26 NOTE — ED Notes (Signed)
IV found to be removed by pt, found by NT to be bleeding from site. Bleeding controlled and bandage applied. RN informed by NT soon thereafter.

## 2017-11-26 NOTE — Progress Notes (Signed)
[facility] ([facility])  [facility] was notified patient was being sent to ED. Chart reviewed and met briefly with patient. He said he was tired and needed to to see the doctor. Upon return to room, ED provider was at bedside and door was closed. [facility] will continue to follow upon return to facility or if patient is admitted.   Please call [facility] if further assistance is needed this evening.   Thank you,  Erling Conte, LCSW [phone]

## 2017-11-26 NOTE — ED Notes (Signed)
ED Provider at bedside. 

## 2017-11-26 NOTE — ED Notes (Signed)
[facility] contacted to transport [facility] to [facility]

## 2017-11-27 DIAGNOSIS — F0150 Vascular dementia without behavioral disturbance: Secondary | ICD-10-CM | POA: Diagnosis not present

## 2017-11-27 DIAGNOSIS — I509 Heart failure, unspecified: Secondary | ICD-10-CM | POA: Diagnosis not present

## 2017-11-27 DIAGNOSIS — I672 Cerebral atherosclerosis: Secondary | ICD-10-CM | POA: Diagnosis not present

## 2017-11-27 DIAGNOSIS — I10 Essential (primary) hypertension: Secondary | ICD-10-CM | POA: Diagnosis not present

## 2017-11-27 DIAGNOSIS — I679 Cerebrovascular disease, unspecified: Secondary | ICD-10-CM | POA: Diagnosis not present

## 2017-11-27 DIAGNOSIS — N189 Chronic kidney disease, unspecified: Secondary | ICD-10-CM | POA: Diagnosis not present

## 2017-11-30 DIAGNOSIS — N189 Chronic kidney disease, unspecified: Secondary | ICD-10-CM | POA: Diagnosis not present

## 2017-11-30 DIAGNOSIS — I672 Cerebral atherosclerosis: Secondary | ICD-10-CM | POA: Diagnosis not present

## 2017-11-30 DIAGNOSIS — F0150 Vascular dementia without behavioral disturbance: Secondary | ICD-10-CM | POA: Diagnosis not present

## 2017-11-30 DIAGNOSIS — I10 Essential (primary) hypertension: Secondary | ICD-10-CM | POA: Diagnosis not present

## 2017-11-30 DIAGNOSIS — I679 Cerebrovascular disease, unspecified: Secondary | ICD-10-CM | POA: Diagnosis not present

## 2017-11-30 DIAGNOSIS — I509 Heart failure, unspecified: Secondary | ICD-10-CM | POA: Diagnosis not present

## 2017-12-08 DIAGNOSIS — I672 Cerebral atherosclerosis: Secondary | ICD-10-CM | POA: Diagnosis not present

## 2017-12-08 DIAGNOSIS — I679 Cerebrovascular disease, unspecified: Secondary | ICD-10-CM | POA: Diagnosis not present

## 2017-12-08 DIAGNOSIS — I509 Heart failure, unspecified: Secondary | ICD-10-CM | POA: Diagnosis not present

## 2017-12-08 DIAGNOSIS — I10 Essential (primary) hypertension: Secondary | ICD-10-CM | POA: Diagnosis not present

## 2017-12-08 DIAGNOSIS — N189 Chronic kidney disease, unspecified: Secondary | ICD-10-CM | POA: Diagnosis not present

## 2017-12-08 DIAGNOSIS — F0150 Vascular dementia without behavioral disturbance: Secondary | ICD-10-CM | POA: Diagnosis not present

## 2017-12-13 DIAGNOSIS — I509 Heart failure, unspecified: Secondary | ICD-10-CM | POA: Diagnosis not present

## 2017-12-13 DIAGNOSIS — I672 Cerebral atherosclerosis: Secondary | ICD-10-CM | POA: Diagnosis not present

## 2017-12-13 DIAGNOSIS — F0150 Vascular dementia without behavioral disturbance: Secondary | ICD-10-CM | POA: Diagnosis not present

## 2017-12-13 DIAGNOSIS — N189 Chronic kidney disease, unspecified: Secondary | ICD-10-CM | POA: Diagnosis not present

## 2017-12-13 DIAGNOSIS — I679 Cerebrovascular disease, unspecified: Secondary | ICD-10-CM | POA: Diagnosis not present

## 2017-12-13 DIAGNOSIS — I10 Essential (primary) hypertension: Secondary | ICD-10-CM | POA: Diagnosis not present

## 2017-12-18 DIAGNOSIS — N189 Chronic kidney disease, unspecified: Secondary | ICD-10-CM | POA: Diagnosis not present

## 2017-12-18 DIAGNOSIS — I679 Cerebrovascular disease, unspecified: Secondary | ICD-10-CM | POA: Diagnosis not present

## 2017-12-18 DIAGNOSIS — I672 Cerebral atherosclerosis: Secondary | ICD-10-CM | POA: Diagnosis not present

## 2017-12-18 DIAGNOSIS — F0150 Vascular dementia without behavioral disturbance: Secondary | ICD-10-CM | POA: Diagnosis not present

## 2017-12-18 DIAGNOSIS — I509 Heart failure, unspecified: Secondary | ICD-10-CM | POA: Diagnosis not present

## 2017-12-18 DIAGNOSIS — I10 Essential (primary) hypertension: Secondary | ICD-10-CM | POA: Diagnosis not present

## 2017-12-19 DIAGNOSIS — K219 Gastro-esophageal reflux disease without esophagitis: Secondary | ICD-10-CM | POA: Diagnosis not present

## 2017-12-19 DIAGNOSIS — F0150 Vascular dementia without behavioral disturbance: Secondary | ICD-10-CM | POA: Diagnosis not present

## 2017-12-19 DIAGNOSIS — F339 Major depressive disorder, recurrent, unspecified: Secondary | ICD-10-CM | POA: Diagnosis not present

## 2017-12-19 DIAGNOSIS — I359 Nonrheumatic aortic valve disorder, unspecified: Secondary | ICD-10-CM | POA: Diagnosis not present

## 2017-12-19 DIAGNOSIS — E1159 Type 2 diabetes mellitus with other circulatory complications: Secondary | ICD-10-CM | POA: Diagnosis not present

## 2017-12-19 DIAGNOSIS — N189 Chronic kidney disease, unspecified: Secondary | ICD-10-CM | POA: Diagnosis not present

## 2017-12-19 DIAGNOSIS — I679 Cerebrovascular disease, unspecified: Secondary | ICD-10-CM | POA: Diagnosis not present

## 2017-12-19 DIAGNOSIS — I509 Heart failure, unspecified: Secondary | ICD-10-CM | POA: Diagnosis not present

## 2017-12-19 DIAGNOSIS — I739 Peripheral vascular disease, unspecified: Secondary | ICD-10-CM | POA: Diagnosis not present

## 2017-12-19 DIAGNOSIS — I10 Essential (primary) hypertension: Secondary | ICD-10-CM | POA: Diagnosis not present

## 2017-12-19 DIAGNOSIS — I672 Cerebral atherosclerosis: Secondary | ICD-10-CM | POA: Diagnosis not present

## 2017-12-21 DIAGNOSIS — I672 Cerebral atherosclerosis: Secondary | ICD-10-CM | POA: Diagnosis not present

## 2017-12-21 DIAGNOSIS — I679 Cerebrovascular disease, unspecified: Secondary | ICD-10-CM | POA: Diagnosis not present

## 2017-12-21 DIAGNOSIS — I509 Heart failure, unspecified: Secondary | ICD-10-CM | POA: Diagnosis not present

## 2017-12-21 DIAGNOSIS — F0150 Vascular dementia without behavioral disturbance: Secondary | ICD-10-CM | POA: Diagnosis not present

## 2017-12-21 DIAGNOSIS — N189 Chronic kidney disease, unspecified: Secondary | ICD-10-CM | POA: Diagnosis not present

## 2017-12-21 DIAGNOSIS — I10 Essential (primary) hypertension: Secondary | ICD-10-CM | POA: Diagnosis not present

## 2017-12-23 DIAGNOSIS — Z23 Encounter for immunization: Secondary | ICD-10-CM | POA: Diagnosis not present

## 2017-12-26 DIAGNOSIS — I679 Cerebrovascular disease, unspecified: Secondary | ICD-10-CM | POA: Diagnosis not present

## 2017-12-26 DIAGNOSIS — I10 Essential (primary) hypertension: Secondary | ICD-10-CM | POA: Diagnosis not present

## 2017-12-26 DIAGNOSIS — F0150 Vascular dementia without behavioral disturbance: Secondary | ICD-10-CM | POA: Diagnosis not present

## 2017-12-26 DIAGNOSIS — I509 Heart failure, unspecified: Secondary | ICD-10-CM | POA: Diagnosis not present

## 2017-12-26 DIAGNOSIS — N189 Chronic kidney disease, unspecified: Secondary | ICD-10-CM | POA: Diagnosis not present

## 2017-12-26 DIAGNOSIS — I672 Cerebral atherosclerosis: Secondary | ICD-10-CM | POA: Diagnosis not present

## 2018-01-02 DIAGNOSIS — F0150 Vascular dementia without behavioral disturbance: Secondary | ICD-10-CM | POA: Diagnosis not present

## 2018-01-02 DIAGNOSIS — I679 Cerebrovascular disease, unspecified: Secondary | ICD-10-CM | POA: Diagnosis not present

## 2018-01-02 DIAGNOSIS — I509 Heart failure, unspecified: Secondary | ICD-10-CM | POA: Diagnosis not present

## 2018-01-02 DIAGNOSIS — I10 Essential (primary) hypertension: Secondary | ICD-10-CM | POA: Diagnosis not present

## 2018-01-02 DIAGNOSIS — N189 Chronic kidney disease, unspecified: Secondary | ICD-10-CM | POA: Diagnosis not present

## 2018-01-02 DIAGNOSIS — I672 Cerebral atherosclerosis: Secondary | ICD-10-CM | POA: Diagnosis not present

## 2018-01-03 DIAGNOSIS — I10 Essential (primary) hypertension: Secondary | ICD-10-CM | POA: Diagnosis not present

## 2018-01-03 DIAGNOSIS — I509 Heart failure, unspecified: Secondary | ICD-10-CM | POA: Diagnosis not present

## 2018-01-03 DIAGNOSIS — F0150 Vascular dementia without behavioral disturbance: Secondary | ICD-10-CM | POA: Diagnosis not present

## 2018-01-03 DIAGNOSIS — I679 Cerebrovascular disease, unspecified: Secondary | ICD-10-CM | POA: Diagnosis not present

## 2018-01-03 DIAGNOSIS — N189 Chronic kidney disease, unspecified: Secondary | ICD-10-CM | POA: Diagnosis not present

## 2018-01-03 DIAGNOSIS — I672 Cerebral atherosclerosis: Secondary | ICD-10-CM | POA: Diagnosis not present

## 2018-01-04 ENCOUNTER — Telehealth: Payer: Self-pay | Admitting: Neurology

## 2018-01-04 NOTE — Telephone Encounter (Signed)
RN call patients daughter Antonio Buchanan about her fathers appt on Monday with Janett Billow NP. RN stated JEssica NP reviewed the chart,and if pt is is hospice his appt can be cancel. The daughter stated its painful to move patient sometimes, but he is stable but losing weight now. Rn advise daughter she can always call to back to reschedule if he gets better. The daughter appreciate the phone call and verbalized appt will be cancel.

## 2018-01-04 NOTE — Telephone Encounter (Signed)
Pt daugther(on DPR-[place]ode[place],Elia @ [phone]) has called to inform that pt is in Hospice and though he is stable, she wants to know if the appointment is really needed.  Please call

## 2018-01-04 NOTE — Telephone Encounter (Signed)
If patient is in hospice, no need to bring patient into office. Please advise daughter. Thank you

## 2018-01-08 ENCOUNTER — Ambulatory Visit: Payer: Medicare Other | Admitting: Adult Health

## 2018-01-11 DIAGNOSIS — N189 Chronic kidney disease, unspecified: Secondary | ICD-10-CM | POA: Diagnosis not present

## 2018-01-11 DIAGNOSIS — I509 Heart failure, unspecified: Secondary | ICD-10-CM | POA: Diagnosis not present

## 2018-01-11 DIAGNOSIS — F0150 Vascular dementia without behavioral disturbance: Secondary | ICD-10-CM | POA: Diagnosis not present

## 2018-01-11 DIAGNOSIS — I672 Cerebral atherosclerosis: Secondary | ICD-10-CM | POA: Diagnosis not present

## 2018-01-11 DIAGNOSIS — I679 Cerebrovascular disease, unspecified: Secondary | ICD-10-CM | POA: Diagnosis not present

## 2018-01-11 DIAGNOSIS — I10 Essential (primary) hypertension: Secondary | ICD-10-CM | POA: Diagnosis not present

## 2018-01-15 DIAGNOSIS — I672 Cerebral atherosclerosis: Secondary | ICD-10-CM | POA: Diagnosis not present

## 2018-01-15 DIAGNOSIS — I10 Essential (primary) hypertension: Secondary | ICD-10-CM | POA: Diagnosis not present

## 2018-01-15 DIAGNOSIS — I679 Cerebrovascular disease, unspecified: Secondary | ICD-10-CM | POA: Diagnosis not present

## 2018-01-15 DIAGNOSIS — I509 Heart failure, unspecified: Secondary | ICD-10-CM | POA: Diagnosis not present

## 2018-01-15 DIAGNOSIS — F0150 Vascular dementia without behavioral disturbance: Secondary | ICD-10-CM | POA: Diagnosis not present

## 2018-01-15 DIAGNOSIS — N189 Chronic kidney disease, unspecified: Secondary | ICD-10-CM | POA: Diagnosis not present

## 2018-01-16 DIAGNOSIS — I10 Essential (primary) hypertension: Secondary | ICD-10-CM | POA: Diagnosis not present

## 2018-01-16 DIAGNOSIS — N189 Chronic kidney disease, unspecified: Secondary | ICD-10-CM | POA: Diagnosis not present

## 2018-01-16 DIAGNOSIS — I509 Heart failure, unspecified: Secondary | ICD-10-CM | POA: Diagnosis not present

## 2018-01-16 DIAGNOSIS — I679 Cerebrovascular disease, unspecified: Secondary | ICD-10-CM | POA: Diagnosis not present

## 2018-01-16 DIAGNOSIS — F0150 Vascular dementia without behavioral disturbance: Secondary | ICD-10-CM | POA: Diagnosis not present

## 2018-01-16 DIAGNOSIS — I672 Cerebral atherosclerosis: Secondary | ICD-10-CM | POA: Diagnosis not present

## 2018-01-19 DIAGNOSIS — F0150 Vascular dementia without behavioral disturbance: Secondary | ICD-10-CM | POA: Diagnosis not present

## 2018-01-19 DIAGNOSIS — I10 Essential (primary) hypertension: Secondary | ICD-10-CM | POA: Diagnosis not present

## 2018-01-19 DIAGNOSIS — I359 Nonrheumatic aortic valve disorder, unspecified: Secondary | ICD-10-CM | POA: Diagnosis not present

## 2018-01-19 DIAGNOSIS — K219 Gastro-esophageal reflux disease without esophagitis: Secondary | ICD-10-CM | POA: Diagnosis not present

## 2018-01-19 DIAGNOSIS — N189 Chronic kidney disease, unspecified: Secondary | ICD-10-CM | POA: Diagnosis not present

## 2018-01-19 DIAGNOSIS — I739 Peripheral vascular disease, unspecified: Secondary | ICD-10-CM | POA: Diagnosis not present

## 2018-01-19 DIAGNOSIS — I679 Cerebrovascular disease, unspecified: Secondary | ICD-10-CM | POA: Diagnosis not present

## 2018-01-19 DIAGNOSIS — F339 Major depressive disorder, recurrent, unspecified: Secondary | ICD-10-CM | POA: Diagnosis not present

## 2018-01-19 DIAGNOSIS — I672 Cerebral atherosclerosis: Secondary | ICD-10-CM | POA: Diagnosis not present

## 2018-01-19 DIAGNOSIS — I509 Heart failure, unspecified: Secondary | ICD-10-CM | POA: Diagnosis not present

## 2018-01-19 DIAGNOSIS — E1159 Type 2 diabetes mellitus with other circulatory complications: Secondary | ICD-10-CM | POA: Diagnosis not present

## 2018-01-22 DIAGNOSIS — N189 Chronic kidney disease, unspecified: Secondary | ICD-10-CM | POA: Diagnosis not present

## 2018-01-22 DIAGNOSIS — I679 Cerebrovascular disease, unspecified: Secondary | ICD-10-CM | POA: Diagnosis not present

## 2018-01-22 DIAGNOSIS — I509 Heart failure, unspecified: Secondary | ICD-10-CM | POA: Diagnosis not present

## 2018-01-22 DIAGNOSIS — I10 Essential (primary) hypertension: Secondary | ICD-10-CM | POA: Diagnosis not present

## 2018-01-22 DIAGNOSIS — F0150 Vascular dementia without behavioral disturbance: Secondary | ICD-10-CM | POA: Diagnosis not present

## 2018-01-22 DIAGNOSIS — I672 Cerebral atherosclerosis: Secondary | ICD-10-CM | POA: Diagnosis not present

## 2018-01-23 DIAGNOSIS — I679 Cerebrovascular disease, unspecified: Secondary | ICD-10-CM | POA: Diagnosis not present

## 2018-01-23 DIAGNOSIS — I509 Heart failure, unspecified: Secondary | ICD-10-CM | POA: Diagnosis not present

## 2018-01-23 DIAGNOSIS — I672 Cerebral atherosclerosis: Secondary | ICD-10-CM | POA: Diagnosis not present

## 2018-01-23 DIAGNOSIS — I10 Essential (primary) hypertension: Secondary | ICD-10-CM | POA: Diagnosis not present

## 2018-01-23 DIAGNOSIS — F0150 Vascular dementia without behavioral disturbance: Secondary | ICD-10-CM | POA: Diagnosis not present

## 2018-01-23 DIAGNOSIS — N189 Chronic kidney disease, unspecified: Secondary | ICD-10-CM | POA: Diagnosis not present

## 2018-01-29 DIAGNOSIS — N189 Chronic kidney disease, unspecified: Secondary | ICD-10-CM | POA: Diagnosis not present

## 2018-01-29 DIAGNOSIS — I509 Heart failure, unspecified: Secondary | ICD-10-CM | POA: Diagnosis not present

## 2018-01-29 DIAGNOSIS — F0150 Vascular dementia without behavioral disturbance: Secondary | ICD-10-CM | POA: Diagnosis not present

## 2018-01-29 DIAGNOSIS — I672 Cerebral atherosclerosis: Secondary | ICD-10-CM | POA: Diagnosis not present

## 2018-01-29 DIAGNOSIS — I679 Cerebrovascular disease, unspecified: Secondary | ICD-10-CM | POA: Diagnosis not present

## 2018-01-29 DIAGNOSIS — I10 Essential (primary) hypertension: Secondary | ICD-10-CM | POA: Diagnosis not present

## 2018-02-06 DIAGNOSIS — I679 Cerebrovascular disease, unspecified: Secondary | ICD-10-CM | POA: Diagnosis not present

## 2018-02-06 DIAGNOSIS — I672 Cerebral atherosclerosis: Secondary | ICD-10-CM | POA: Diagnosis not present

## 2018-02-06 DIAGNOSIS — F0150 Vascular dementia without behavioral disturbance: Secondary | ICD-10-CM | POA: Diagnosis not present

## 2018-02-06 DIAGNOSIS — N189 Chronic kidney disease, unspecified: Secondary | ICD-10-CM | POA: Diagnosis not present

## 2018-02-06 DIAGNOSIS — I10 Essential (primary) hypertension: Secondary | ICD-10-CM | POA: Diagnosis not present

## 2018-02-06 DIAGNOSIS — I509 Heart failure, unspecified: Secondary | ICD-10-CM | POA: Diagnosis not present

## 2018-02-07 DIAGNOSIS — I10 Essential (primary) hypertension: Secondary | ICD-10-CM | POA: Diagnosis not present

## 2018-02-07 DIAGNOSIS — I679 Cerebrovascular disease, unspecified: Secondary | ICD-10-CM | POA: Diagnosis not present

## 2018-02-07 DIAGNOSIS — N189 Chronic kidney disease, unspecified: Secondary | ICD-10-CM | POA: Diagnosis not present

## 2018-02-07 DIAGNOSIS — I672 Cerebral atherosclerosis: Secondary | ICD-10-CM | POA: Diagnosis not present

## 2018-02-07 DIAGNOSIS — F0150 Vascular dementia without behavioral disturbance: Secondary | ICD-10-CM | POA: Diagnosis not present

## 2018-02-07 DIAGNOSIS — I509 Heart failure, unspecified: Secondary | ICD-10-CM | POA: Diagnosis not present

## 2018-02-09 DIAGNOSIS — F0150 Vascular dementia without behavioral disturbance: Secondary | ICD-10-CM | POA: Diagnosis not present

## 2018-02-09 DIAGNOSIS — I509 Heart failure, unspecified: Secondary | ICD-10-CM | POA: Diagnosis not present

## 2018-02-09 DIAGNOSIS — I672 Cerebral atherosclerosis: Secondary | ICD-10-CM | POA: Diagnosis not present

## 2018-02-09 DIAGNOSIS — I10 Essential (primary) hypertension: Secondary | ICD-10-CM | POA: Diagnosis not present

## 2018-02-09 DIAGNOSIS — I679 Cerebrovascular disease, unspecified: Secondary | ICD-10-CM | POA: Diagnosis not present

## 2018-02-09 DIAGNOSIS — N189 Chronic kidney disease, unspecified: Secondary | ICD-10-CM | POA: Diagnosis not present

## 2018-02-13 DIAGNOSIS — I679 Cerebrovascular disease, unspecified: Secondary | ICD-10-CM | POA: Diagnosis not present

## 2018-02-13 DIAGNOSIS — I509 Heart failure, unspecified: Secondary | ICD-10-CM | POA: Diagnosis not present

## 2018-02-13 DIAGNOSIS — I672 Cerebral atherosclerosis: Secondary | ICD-10-CM | POA: Diagnosis not present

## 2018-02-13 DIAGNOSIS — N189 Chronic kidney disease, unspecified: Secondary | ICD-10-CM | POA: Diagnosis not present

## 2018-02-13 DIAGNOSIS — F0150 Vascular dementia without behavioral disturbance: Secondary | ICD-10-CM | POA: Diagnosis not present

## 2018-02-13 DIAGNOSIS — I10 Essential (primary) hypertension: Secondary | ICD-10-CM | POA: Diagnosis not present

## 2018-02-18 DIAGNOSIS — I672 Cerebral atherosclerosis: Secondary | ICD-10-CM | POA: Diagnosis not present

## 2018-02-18 DIAGNOSIS — E1159 Type 2 diabetes mellitus with other circulatory complications: Secondary | ICD-10-CM | POA: Diagnosis not present

## 2018-02-18 DIAGNOSIS — F339 Major depressive disorder, recurrent, unspecified: Secondary | ICD-10-CM | POA: Diagnosis not present

## 2018-02-18 DIAGNOSIS — I509 Heart failure, unspecified: Secondary | ICD-10-CM | POA: Diagnosis not present

## 2018-02-18 DIAGNOSIS — F0150 Vascular dementia without behavioral disturbance: Secondary | ICD-10-CM | POA: Diagnosis not present

## 2018-02-18 DIAGNOSIS — I679 Cerebrovascular disease, unspecified: Secondary | ICD-10-CM | POA: Diagnosis not present

## 2018-02-18 DIAGNOSIS — I10 Essential (primary) hypertension: Secondary | ICD-10-CM | POA: Diagnosis not present

## 2018-02-18 DIAGNOSIS — N189 Chronic kidney disease, unspecified: Secondary | ICD-10-CM | POA: Diagnosis not present

## 2018-02-18 DIAGNOSIS — K219 Gastro-esophageal reflux disease without esophagitis: Secondary | ICD-10-CM | POA: Diagnosis not present

## 2018-02-18 DIAGNOSIS — I359 Nonrheumatic aortic valve disorder, unspecified: Secondary | ICD-10-CM | POA: Diagnosis not present

## 2018-02-18 DIAGNOSIS — I739 Peripheral vascular disease, unspecified: Secondary | ICD-10-CM | POA: Diagnosis not present

## 2018-03-06 ENCOUNTER — Emergency Department (HOSPITAL_COMMUNITY)
Admission: EM | Admit: 2018-03-06 | Discharge: 2018-03-07 | Disposition: A | Discharge status: Home / Self Care | Attending: Emergency Medicine | Admitting: Emergency Medicine

## 2018-03-06 ENCOUNTER — Other Ambulatory Visit: Payer: Self-pay

## 2018-03-06 ENCOUNTER — Encounter (HOSPITAL_COMMUNITY): Payer: Self-pay | Admitting: Emergency Medicine

## 2018-03-06 DIAGNOSIS — Z87891 Personal history of nicotine dependence: Secondary | ICD-10-CM | POA: Insufficient documentation

## 2018-03-06 DIAGNOSIS — R0902 Hypoxemia: Secondary | ICD-10-CM | POA: Diagnosis not present

## 2018-03-06 DIAGNOSIS — I10 Essential (primary) hypertension: Secondary | ICD-10-CM | POA: Diagnosis not present

## 2018-03-06 DIAGNOSIS — Y92129 Unspecified place in nursing home as the place of occurrence of the external cause: Secondary | ICD-10-CM | POA: Insufficient documentation

## 2018-03-06 DIAGNOSIS — S61411A Laceration without foreign body of right hand, initial encounter: Secondary | ICD-10-CM | POA: Diagnosis not present

## 2018-03-06 DIAGNOSIS — Z23 Encounter for immunization: Secondary | ICD-10-CM | POA: Insufficient documentation

## 2018-03-06 DIAGNOSIS — F29 Unspecified psychosis not due to a substance or known physiological condition: Secondary | ICD-10-CM | POA: Diagnosis not present

## 2018-03-06 DIAGNOSIS — F0390 Unspecified dementia without behavioral disturbance: Secondary | ICD-10-CM | POA: Insufficient documentation

## 2018-03-06 DIAGNOSIS — R410 Disorientation, unspecified: Secondary | ICD-10-CM | POA: Diagnosis not present

## 2018-03-06 DIAGNOSIS — S0990XA Unspecified injury of head, initial encounter: Secondary | ICD-10-CM | POA: Diagnosis present

## 2018-03-06 DIAGNOSIS — E119 Type 2 diabetes mellitus without complications: Secondary | ICD-10-CM | POA: Diagnosis not present

## 2018-03-06 DIAGNOSIS — Y939 Activity, unspecified: Secondary | ICD-10-CM | POA: Diagnosis not present

## 2018-03-06 DIAGNOSIS — Y999 Unspecified external cause status: Secondary | ICD-10-CM | POA: Diagnosis not present

## 2018-03-06 DIAGNOSIS — R58 Hemorrhage, not elsewhere classified: Secondary | ICD-10-CM | POA: Diagnosis not present

## 2018-03-06 DIAGNOSIS — S0003XA Contusion of scalp, initial encounter: Secondary | ICD-10-CM | POA: Insufficient documentation

## 2018-03-06 DIAGNOSIS — Z8673 Personal history of transient ischemic attack (TIA), and cerebral infarction without residual deficits: Secondary | ICD-10-CM | POA: Diagnosis not present

## 2018-03-06 DIAGNOSIS — W19XXXA Unspecified fall, initial encounter: Secondary | ICD-10-CM | POA: Diagnosis not present

## 2018-03-06 DIAGNOSIS — R404 Transient alteration of awareness: Secondary | ICD-10-CM | POA: Diagnosis not present

## 2018-03-06 DIAGNOSIS — Z7982 Long term (current) use of aspirin: Secondary | ICD-10-CM | POA: Diagnosis not present

## 2018-03-06 NOTE — ED Triage Notes (Signed)
Pt arriving via [facility] from [facility] for an unwitnessed fall. Refused to wear c-collar, reports feeling a "lump" on the back of his head. Pt has laceration on back of his right hand. Pt at baseline per facility report. No blood thinners.

## 2018-03-07 ENCOUNTER — Emergency Department (HOSPITAL_COMMUNITY)

## 2018-03-07 MED ORDER — TETANUS-DIPHTH-ACELL PERTUSSIS 5-2.5-18.5 LF-MCG/0.5 IM SUSP
0.5000 mL | Freq: Once | INTRAMUSCULAR | Status: AC
  Administered 2018-03-07: 0.5 mL via INTRAMUSCULAR
  Filled 2018-03-07: qty 0.5

## 2018-03-07 NOTE — ED Notes (Signed)
Patient transported to CT 

## 2018-03-07 NOTE — ED Provider Notes (Signed)
[facility] DEPT Provider Note   CSN: [id number] Arrival date & time: 03/06/18  2243     History   Chief Complaint Chief Complaint  Patient presents with  . Fall    HPI Antonio Buchanan is a 84 y.o. male.  The history is provided by the patient and the nursing home. The history is limited by the condition of the patient (Dementia).  He has history of hypertension, hyperlipidemia, polymyalgia rheumatica, posttraumatic stress disorder, stroke, ataxic gait and comes in following a fall at the skilled nursing facility where he lives.  He did hit the back of his head but denies loss of consciousness.  He suffered a skin tear to his right hand.  He denies other injury.  He tells me that he has frequent falls because he has that disease which makes to fall a lot.  Past Medical History:  Diagnosis Date  . Anxiety   . Aortic stenosis   . Benign neoplasm of colon   . Cancer ([place])    left cheek  . Diabetes mellitus   . Diverticulosis of colon (without mention of hemorrhage) 02/03/2005  . Hemorrhoids 02/03/2005   Internal and external  . History of Lyme disease   . HTN (hypertension)   . Hyperlipemia   . Personal history of colonic polyps   . Phlebitis 06-24-2014   left leg  . Polymyalgia rheumatica ([place])   . PTSD (post-traumatic stress disorder)   . Spinal stenosis   . Stroke ([place])   . Thalassemia   . Varicose veins     Patient Active Problem List   Diagnosis Date Noted  . Adjustment disorder with mixed disturbance of emotions and conduct 11/09/2017  . Acute on chronic congestive heart failure ([place])   . Multiple closed fractures of ribs of right side   . Primary osteoarthritis of left knee 09/10/2017  . Acute arterial ischemic stroke, vertebrobasilar, cerebellar ([place]) 09/04/2017  . Delirium 09/04/2017  . Ataxic gait 09/04/2017  . Polymyalgia rheumatica ([place]) 06/09/2016  . Spondylosis of lumbar region without myelopathy or  radiculopathy 06/09/2016  . DDD (degenerative disc disease), cervical 06/09/2016  . Primary osteoarthritis of both hands 06/09/2016  . History of scoliosis 06/09/2016  . Venous insufficiency 06/09/2016  . History of thalassemia 06/09/2016  . History of trigeminal neuralgia 06/09/2016  . History of diabetes mellitus 06/09/2016  . Rash 09/08/2014  . ARF (acute renal failure) ([place]) 09/08/2014  . Venous stasis dermatitis of left lower extremity 09/08/2014  . Dehydration 09/08/2014  . Allergic reaction caused by a drug 09/08/2014  . Congestive dilated cardiomyopathy ([place]) 08/29/2014  . Chronic constipation 03/26/2012  . Aortic stenosis 03/01/2012  . Chronic venous hypertension without complications 15/40/0867  . Murmur 02/24/2011  . Tobacco abuse 02/24/2011  . Diabetes mellitus ([place])   . HTN (hypertension)   . Hyperlipemia   . Spinal stenosis   . Thalassemia   . History of Lyme disease   . PTSD (post-traumatic stress disorder)   . GERD 04/06/2009  . NAUSEA 04/06/2009  . FLATULENCE-GAS-BLOATING 04/06/2009  . ABDOMINAL PAIN-EPIGASTRIC 04/06/2009    Past Surgical History:  Procedure Laterality Date  . CATARACT EXTRACTION, BILATERAL    . HEMORROIDECTOMY    . KIDNEY STONE SURGERY          Home Medications    Prior to Admission medications   Medication Sig Start Date End Date Taking? Authorizing Provider  aspirin 81 MG tablet Take 1 tablet (81 mg total) by mouth daily. 10/06/17  Garvin Fila, MD  ENSURE (ENSURE) Take 237 mLs by mouth 2 (two) times daily.    [provider]  furosemide (LASIX) 20 MG tablet Take 1 tablet (20 mg total) by mouth daily for 7 days. 09/18/17 10/06/17  Doreatha Lew, MD  HYDROcodone-acetaminophen (NORCO) 5-325 MG tablet Take 0.5-1 tablets by mouth every 6 (six) hours as needed. Patient taking differently: Take 0.5 tablets by mouth every 6 (six) hours as needed for moderate pain or severe pain (Not to exceed 2 tablets in 24 hours.).   10/28/17   Sherwood Gambler, MD  metoprolol succinate (TOPROL-XL) 25 MG 24 hr tablet Take 1 tablet (25 mg total) by mouth daily. Patient not taking: Reported on 10/28/2017 09/05/17   Alma Friendly, MD  OLANZapine (ZYPREXA) 2.5 MG tablet Take 1 tablet (2.5 mg total) by mouth 2 (two) times daily as needed (agitation). Patient taking differently: Take 2.5 mg by mouth every 12 (twelve) hours as needed (agitation).  11/09/17   Patrecia Pour, NP  pantoprazole (PROTONIX) 40 MG tablet Take 1 tablet by mouth every morning. Patient taking differently: Take 40 mg by mouth daily.  06/21/17   Esterwood, Amy S, PA-C  predniSONE (DELTASONE) 5 MG tablet TAKE 1 TABLET(5 MG) BY MOUTH DAILY WITH BREAKFAST Patient not taking: No sig reported 09/11/17   Bo Merino, MD  sacubitril-valsartan (ENTRESTO) 24-26 MG Take 1 tablet by mouth 2 (two) times daily. Patient taking differently: Take 1 tablet by mouth daily.  08/07/17   Lendon Colonel, NP  spironolactone (ALDACTONE) 25 MG tablet Take 0.5 tablets (12.5 mg total) by mouth daily. 08/07/17   Lendon Colonel, NP  traMADol (ULTRAM) 50 MG tablet Take 1 tablet (50 mg total) by mouth every 6 (six) hours as needed. Patient not taking: Reported on 10/28/2017 10/11/17   Dorie Rank, MD  traZODone (DESYREL) 50 MG tablet Take 1 tablet (50 mg total) by mouth at bedtime. Patient not taking: Reported on 10/28/2017 09/18/17   Patrecia Pour, Christean Grief, MD  vitamin B-12 (CYANOCOBALAMIN) 1000 MCG tablet Take 1 tablet (1,000 mcg total) by mouth daily. 09/05/17   Alma Friendly, MD    Family History Family History  Problem Relation Age of Onset  . Diabetes Father   . Leukemia Mother   . Hypertension Mother   . Arthritis Mother   . Diabetes Sister     Social History Social History   Tobacco Use  . Smoking status: Former Smoker    Years: 78.00    Types: Cigars  . Smokeless tobacco: Never Used  Substance Use Topics  . Alcohol use: No    Alcohol/week: 0.0 standard  drinks  . Drug use: No     Allergies   Cephalosporins and Lamisil af defense [tolnaftate]   Review of Systems Review of Systems  Unable to perform ROS: Dementia     Physical Exam Updated Vital Signs BP 109/79   Pulse 68   Temp 97.7 F (36.5 C) (Oral)   Resp 16   SpO2 95%   Physical Exam Vitals signs and nursing note reviewed.    84 year old male, resting comfortably and in no acute distress. Vital signs are normal. Oxygen saturation is 95%, which is normal. Head is normocephalic.  Small hematoma present on occiput. PERRLA, EOMI. Oropharynx is clear. Neck is nontender without adenopathy or JVD. Back is nontender and there is no CVA tenderness. Lungs are clear without rales, wheezes, or rhonchi. Chest is nontender. Heart has regular rate  and rhythm without murmur. Abdomen is soft, flat, nontender without masses or hepatosplenomegaly and peristalsis is normoactive. Extremities have no cyanosis or edema, full range of motion is present.  Skin tear present on the dorsum of the right hand.  No swelling or deformity. Skin is warm and dry without rash. Neurologic: Awake and alert and oriented to person and place but not time, cranial nerves are intact, there are no motor or sensory deficits.  ED Treatments / Results   Radiology Ct Head Wo Contrast  Result Date: 03/07/2018 CLINICAL DATA:  Post unwitnessed fall. Maxface trauma blunt. Lump to back of head. EXAM: CT HEAD WITHOUT CONTRAST CT CERVICAL SPINE WITHOUT CONTRAST TECHNIQUE: Multidetector CT imaging of the head and cervical spine was performed following the standard protocol without intravenous contrast. Multiplanar CT image reconstructions of the cervical spine were also generated. COMPARISON:  Head and cervical spine CT 11/26/2017 FINDINGS: CT HEAD FINDINGS Brain: Generalized atrophy, unchanged. Mild chronic small vessel ischemia and remote lacunar infarct in the left cerebellum. No intracranial hemorrhage, mass effect, or  midline shift. No hydrocephalus. The basilar cisterns are patent. No evidence of territorial infarct or acute ischemia. No extra-axial or intracranial fluid collection. Vascular: Atherosclerosis of skullbase vasculature without hyperdense vessel or abnormal calcification. Skull: No fracture or focal lesion. Sinuses/Orbits: No acute findings. Mucosal thickening of left maxillary sinus, unchanged. Chronic opacification of left mastoid air cells. The visualized orbits are unremarkable. Bilateral cataract resection. Other: None. CT CERVICAL SPINE FINDINGS Alignment: Stable from prior exam with minimal anterolisthesis of C3 on C4 and mild retrolisthesis of C5 on C6. No traumatic subluxation. Skull base and vertebrae: No acute fracture. The dens and skull base are intact. Soft tissues and spinal canal: No prevertebral fluid or swelling. No visible canal hematoma. Disc levels: Diffuse disc space narrowing and endplate spurring. Multilevel facet arthropathy. Degenerative changes are stable from prior. Upper chest: Thoracic aortic atherosclerosis. No acute findings. Other: Carotid calcifications. IMPRESSION: 1. No acute intracranial abnormality. No skull fracture. Stable atrophy and chronic small vessel ischemia. 2. Multilevel degenerative change in the cervical spine without acute fracture or subluxation. 3. Carotid and skullbase atherosclerosis. Aortic Atherosclerosis (ICD10-I70.0). Electronically Signed   By: Keith Rake M.D.   On: 03/07/2018 02:00   Ct Cervical Spine Wo Contrast  Result Date: 03/07/2018 CLINICAL DATA:  Post unwitnessed fall. Maxface trauma blunt. Lump to back of head. EXAM: CT HEAD WITHOUT CONTRAST CT CERVICAL SPINE WITHOUT CONTRAST TECHNIQUE: Multidetector CT imaging of the head and cervical spine was performed following the standard protocol without intravenous contrast. Multiplanar CT image reconstructions of the cervical spine were also generated. COMPARISON:  Head and cervical spine CT  11/26/2017 FINDINGS: CT HEAD FINDINGS Brain: Generalized atrophy, unchanged. Mild chronic small vessel ischemia and remote lacunar infarct in the left cerebellum. No intracranial hemorrhage, mass effect, or midline shift. No hydrocephalus. The basilar cisterns are patent. No evidence of territorial infarct or acute ischemia. No extra-axial or intracranial fluid collection. Vascular: Atherosclerosis of skullbase vasculature without hyperdense vessel or abnormal calcification. Skull: No fracture or focal lesion. Sinuses/Orbits: No acute findings. Mucosal thickening of left maxillary sinus, unchanged. Chronic opacification of left mastoid air cells. The visualized orbits are unremarkable. Bilateral cataract resection. Other: None. CT CERVICAL SPINE FINDINGS Alignment: Stable from prior exam with minimal anterolisthesis of C3 on C4 and mild retrolisthesis of C5 on C6. No traumatic subluxation. Skull base and vertebrae: No acute fracture. The dens and skull base are intact. Soft tissues and spinal canal: No prevertebral  fluid or swelling. No visible canal hematoma. Disc levels: Diffuse disc space narrowing and endplate spurring. Multilevel facet arthropathy. Degenerative changes are stable from prior. Upper chest: Thoracic aortic atherosclerosis. No acute findings. Other: Carotid calcifications. IMPRESSION: 1. No acute intracranial abnormality. No skull fracture. Stable atrophy and chronic small vessel ischemia. 2. Multilevel degenerative change in the cervical spine without acute fracture or subluxation. 3. Carotid and skullbase atherosclerosis. Aortic Atherosclerosis (ICD10-I70.0). Electronically Signed   By: Keith Rake M.D.   On: 03/07/2018 02:00   Dg Hand Complete Right  Result Date: 03/07/2018 CLINICAL DATA:  Post fall with laceration posteriorly. EXAM: RIGHT HAND - COMPLETE 3+ VIEW COMPARISON:  Radiograph 11/08/2017 FINDINGS: There is no evidence of fracture or dislocation. Multifocal osteoarthritis is  unchanged from prior exam. The proximal interphalangeal joint of the fifth digit has a "gull wing" configuration and can be seen with erosive osteoarthritis, unchanged. Borderline widening of the scapholunate interval. Site of laceration is not well seen radiographically. No radiopaque foreign body. Soft tissues are unremarkable. IMPRESSION: 1. No radiopaque foreign body or acute osseous abnormality. 2. Stable multifocal arthritis since August. Electronically Signed   By: Keith Rake M.D.   On: 03/07/2018 01:06    Procedures Procedures  Medications Ordered in ED Medications  Tdap (BOOSTRIX) injection 0.5 mL (has no administration in time range)     Initial Impression / Assessment and Plan / ED Course  I have reviewed the triage vital signs and the nursing notes.  Pertinent imaging results that were available during my care of the patient were reviewed by me and considered in my medical decision making (see chart for details).  Fall at nursing home with head injury.  He will be sent for CT of cervical spine and head.  Skin tear of the right hand.  No report on record of last tetanus immunization, will give Tdap booster.  Old records are reviewed, and he does have prior ED visits for falls.  Hand x-rays negative, CT of head and cervical spine show no acute process.  He is discharged back to his nursing care facility with instructions on routine care of skin tears.  Final Clinical Impressions(s) / ED Diagnoses   Final diagnoses:  Fall at nursing home, initial encounter  Contusion of occipital region of scalp, initial encounter  Skin tear of hand without complication, right, initial encounter    ED Discharge Orders    None       Delora Fuel, MD 63/33/54 [phone]

## 2018-03-07 NOTE — ED Notes (Signed)
Patient returned from CT

## 2018-03-21 DIAGNOSIS — I672 Cerebral atherosclerosis: Secondary | ICD-10-CM | POA: Diagnosis not present

## 2018-03-21 DIAGNOSIS — I679 Cerebrovascular disease, unspecified: Secondary | ICD-10-CM | POA: Diagnosis not present

## 2018-03-21 DIAGNOSIS — N189 Chronic kidney disease, unspecified: Secondary | ICD-10-CM | POA: Diagnosis not present

## 2018-03-21 DIAGNOSIS — I359 Nonrheumatic aortic valve disorder, unspecified: Secondary | ICD-10-CM | POA: Diagnosis not present

## 2018-03-21 DIAGNOSIS — E1159 Type 2 diabetes mellitus with other circulatory complications: Secondary | ICD-10-CM | POA: Diagnosis not present

## 2018-03-21 DIAGNOSIS — I10 Essential (primary) hypertension: Secondary | ICD-10-CM | POA: Diagnosis not present

## 2018-03-21 DIAGNOSIS — K219 Gastro-esophageal reflux disease without esophagitis: Secondary | ICD-10-CM | POA: Diagnosis not present

## 2018-03-21 DIAGNOSIS — F339 Major depressive disorder, recurrent, unspecified: Secondary | ICD-10-CM | POA: Diagnosis not present

## 2018-03-21 DIAGNOSIS — I739 Peripheral vascular disease, unspecified: Secondary | ICD-10-CM | POA: Diagnosis not present

## 2018-03-21 DIAGNOSIS — F0150 Vascular dementia without behavioral disturbance: Secondary | ICD-10-CM | POA: Diagnosis not present

## 2018-03-21 DIAGNOSIS — I509 Heart failure, unspecified: Secondary | ICD-10-CM | POA: Diagnosis not present

## 2018-03-22 DIAGNOSIS — F0150 Vascular dementia without behavioral disturbance: Secondary | ICD-10-CM | POA: Diagnosis not present

## 2018-03-22 DIAGNOSIS — I509 Heart failure, unspecified: Secondary | ICD-10-CM | POA: Diagnosis not present

## 2018-03-22 DIAGNOSIS — I672 Cerebral atherosclerosis: Secondary | ICD-10-CM | POA: Diagnosis not present

## 2018-03-22 DIAGNOSIS — I10 Essential (primary) hypertension: Secondary | ICD-10-CM | POA: Diagnosis not present

## 2018-03-22 DIAGNOSIS — N189 Chronic kidney disease, unspecified: Secondary | ICD-10-CM | POA: Diagnosis not present

## 2018-03-22 DIAGNOSIS — I679 Cerebrovascular disease, unspecified: Secondary | ICD-10-CM | POA: Diagnosis not present

## 2018-03-23 DIAGNOSIS — F0150 Vascular dementia without behavioral disturbance: Secondary | ICD-10-CM | POA: Diagnosis not present

## 2018-03-23 DIAGNOSIS — I672 Cerebral atherosclerosis: Secondary | ICD-10-CM | POA: Diagnosis not present

## 2018-03-23 DIAGNOSIS — I509 Heart failure, unspecified: Secondary | ICD-10-CM | POA: Diagnosis not present

## 2018-03-23 DIAGNOSIS — I10 Essential (primary) hypertension: Secondary | ICD-10-CM | POA: Diagnosis not present

## 2018-03-23 DIAGNOSIS — N189 Chronic kidney disease, unspecified: Secondary | ICD-10-CM | POA: Diagnosis not present

## 2018-03-23 DIAGNOSIS — I679 Cerebrovascular disease, unspecified: Secondary | ICD-10-CM | POA: Diagnosis not present

## 2018-03-26 DIAGNOSIS — I509 Heart failure, unspecified: Secondary | ICD-10-CM | POA: Diagnosis not present

## 2018-03-26 DIAGNOSIS — F0150 Vascular dementia without behavioral disturbance: Secondary | ICD-10-CM | POA: Diagnosis not present

## 2018-03-26 DIAGNOSIS — I672 Cerebral atherosclerosis: Secondary | ICD-10-CM | POA: Diagnosis not present

## 2018-03-26 DIAGNOSIS — N189 Chronic kidney disease, unspecified: Secondary | ICD-10-CM | POA: Diagnosis not present

## 2018-03-26 DIAGNOSIS — I10 Essential (primary) hypertension: Secondary | ICD-10-CM | POA: Diagnosis not present

## 2018-03-26 DIAGNOSIS — I679 Cerebrovascular disease, unspecified: Secondary | ICD-10-CM | POA: Diagnosis not present

## 2018-03-27 DIAGNOSIS — I672 Cerebral atherosclerosis: Secondary | ICD-10-CM | POA: Diagnosis not present

## 2018-03-27 DIAGNOSIS — I10 Essential (primary) hypertension: Secondary | ICD-10-CM | POA: Diagnosis not present

## 2018-03-27 DIAGNOSIS — I679 Cerebrovascular disease, unspecified: Secondary | ICD-10-CM | POA: Diagnosis not present

## 2018-03-27 DIAGNOSIS — I509 Heart failure, unspecified: Secondary | ICD-10-CM | POA: Diagnosis not present

## 2018-03-27 DIAGNOSIS — N189 Chronic kidney disease, unspecified: Secondary | ICD-10-CM | POA: Diagnosis not present

## 2018-03-27 DIAGNOSIS — F0150 Vascular dementia without behavioral disturbance: Secondary | ICD-10-CM | POA: Diagnosis not present

## 2018-03-29 DIAGNOSIS — F0150 Vascular dementia without behavioral disturbance: Secondary | ICD-10-CM | POA: Diagnosis not present

## 2018-03-29 DIAGNOSIS — I672 Cerebral atherosclerosis: Secondary | ICD-10-CM | POA: Diagnosis not present

## 2018-03-29 DIAGNOSIS — I679 Cerebrovascular disease, unspecified: Secondary | ICD-10-CM | POA: Diagnosis not present

## 2018-03-29 DIAGNOSIS — N189 Chronic kidney disease, unspecified: Secondary | ICD-10-CM | POA: Diagnosis not present

## 2018-03-29 DIAGNOSIS — I10 Essential (primary) hypertension: Secondary | ICD-10-CM | POA: Diagnosis not present

## 2018-03-29 DIAGNOSIS — I509 Heart failure, unspecified: Secondary | ICD-10-CM | POA: Diagnosis not present

## 2018-03-30 DIAGNOSIS — I679 Cerebrovascular disease, unspecified: Secondary | ICD-10-CM | POA: Diagnosis not present

## 2018-03-30 DIAGNOSIS — I509 Heart failure, unspecified: Secondary | ICD-10-CM | POA: Diagnosis not present

## 2018-03-30 DIAGNOSIS — I10 Essential (primary) hypertension: Secondary | ICD-10-CM | POA: Diagnosis not present

## 2018-03-30 DIAGNOSIS — I672 Cerebral atherosclerosis: Secondary | ICD-10-CM | POA: Diagnosis not present

## 2018-03-30 DIAGNOSIS — F0150 Vascular dementia without behavioral disturbance: Secondary | ICD-10-CM | POA: Diagnosis not present

## 2018-03-30 DIAGNOSIS — N189 Chronic kidney disease, unspecified: Secondary | ICD-10-CM | POA: Diagnosis not present

## 2018-03-31 DIAGNOSIS — I672 Cerebral atherosclerosis: Secondary | ICD-10-CM | POA: Diagnosis not present

## 2018-03-31 DIAGNOSIS — N189 Chronic kidney disease, unspecified: Secondary | ICD-10-CM | POA: Diagnosis not present

## 2018-03-31 DIAGNOSIS — I679 Cerebrovascular disease, unspecified: Secondary | ICD-10-CM | POA: Diagnosis not present

## 2018-03-31 DIAGNOSIS — F0150 Vascular dementia without behavioral disturbance: Secondary | ICD-10-CM | POA: Diagnosis not present

## 2018-03-31 DIAGNOSIS — I509 Heart failure, unspecified: Secondary | ICD-10-CM | POA: Diagnosis not present

## 2018-03-31 DIAGNOSIS — I10 Essential (primary) hypertension: Secondary | ICD-10-CM | POA: Diagnosis not present

## 2018-04-01 DIAGNOSIS — F0150 Vascular dementia without behavioral disturbance: Secondary | ICD-10-CM | POA: Diagnosis not present

## 2018-04-01 DIAGNOSIS — I672 Cerebral atherosclerosis: Secondary | ICD-10-CM | POA: Diagnosis not present

## 2018-04-01 DIAGNOSIS — I10 Essential (primary) hypertension: Secondary | ICD-10-CM | POA: Diagnosis not present

## 2018-04-01 DIAGNOSIS — N189 Chronic kidney disease, unspecified: Secondary | ICD-10-CM | POA: Diagnosis not present

## 2018-04-01 DIAGNOSIS — I509 Heart failure, unspecified: Secondary | ICD-10-CM | POA: Diagnosis not present

## 2018-04-01 DIAGNOSIS — I679 Cerebrovascular disease, unspecified: Secondary | ICD-10-CM | POA: Diagnosis not present

## 2018-04-21 DEATH — deceased

## 2018-06-18 ENCOUNTER — Other Ambulatory Visit: Payer: Self-pay

## 2018-06-26 ENCOUNTER — Telehealth: Payer: Self-pay | Admitting: *Deleted

## 2018-06-26 NOTE — Telephone Encounter (Signed)
After several attempts to submit [employer] PA to cover my meds with key provided kept getting rttot message saying patient not located.

## 2018-06-26 NOTE — Telephone Encounter (Signed)
-----   Message from [place], [place] sent at 06/26/2018 12:24 PM EDT ----- PRIOR AUTH  ENTRESTO 24-26 MG KEY: [id number]

## 2018-06-27 ENCOUNTER — Telehealth: Payer: Self-pay | Admitting: Cardiology

## 2018-06-27 NOTE — Telephone Encounter (Signed)
I did it yesterday

## 2018-06-27 NOTE — Telephone Encounter (Signed)
PA

## 2018-06-27 NOTE — Telephone Encounter (Signed)
Pt's ENTRESTO's pre Auth. Is expiring do you want to renew. Key [id number]   To call number express cript  # [phone]

## 2018-06-27 NOTE — Telephone Encounter (Signed)
Awesome, thanks.

## 2019-03-06 IMAGING — CT CT ABD-PELV W/ CM
2 of 5 series · 16 of 46 positions shown, 18 images · IV contrast (iopamidol)
Comparison: Radiographs dated 09/18/2017 and CT scan of the abdomen
dated 09/12/2014

CLINICAL DATA: Right upper quadrant abdominal pain and adjacent
right rib pain. The patient has had the pain since he fell 4 weeks
ago.

EXAM:
CT ABDOMEN AND PELVIS WITH CONTRAST
TECHNIQUE: Multidetector CT imaging of the abdomen and pelvis was performed
using the standard protocol following bolus administration of
intravenous contrast.
CONTRAST:  100mL PCARL9-J33 IOPAMIDOL (PCARL9-J33) INJECTION 61%

[Series 2: axial st · axial · 0.85mm/px · z∈[+1073,+1468]mm · 13 of 93 slices shown, 15 images]
[im 7/93  soft-tissue]
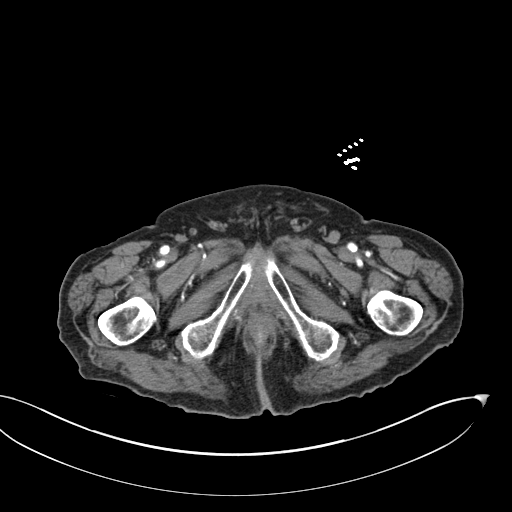
[im 7/93  bone]
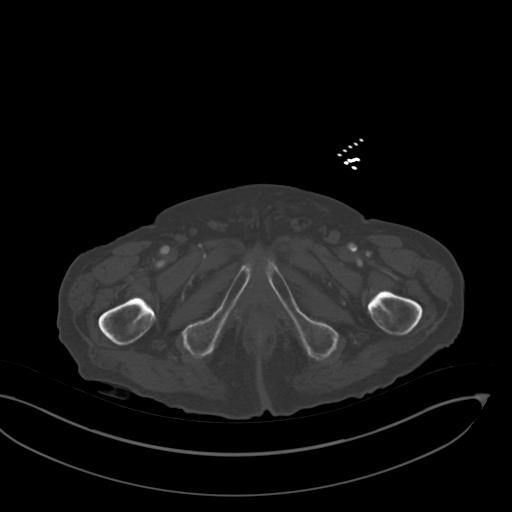
[im 14/93  soft-tissue]
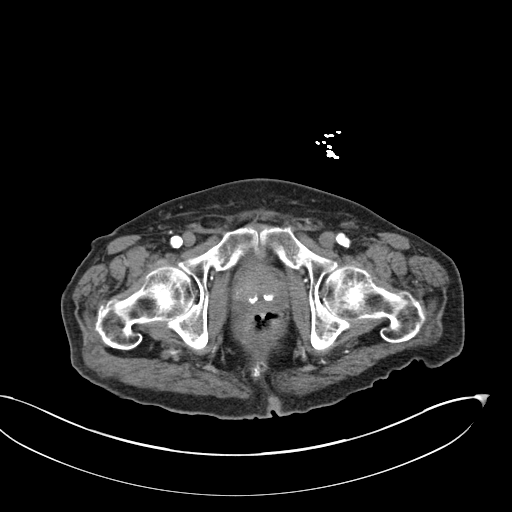
[im 20/93  soft-tissue]
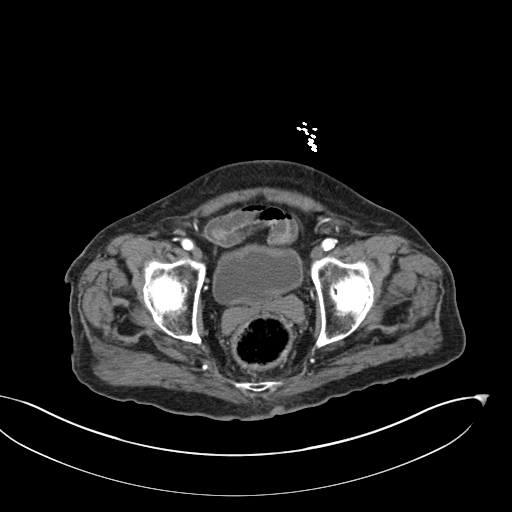
[im 27/93  soft-tissue]
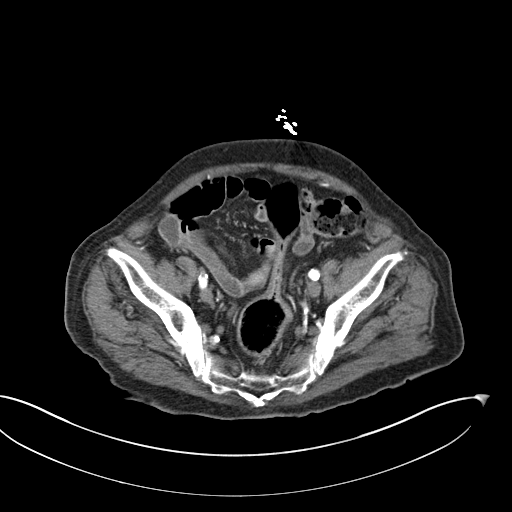
[im 33/93  soft-tissue]
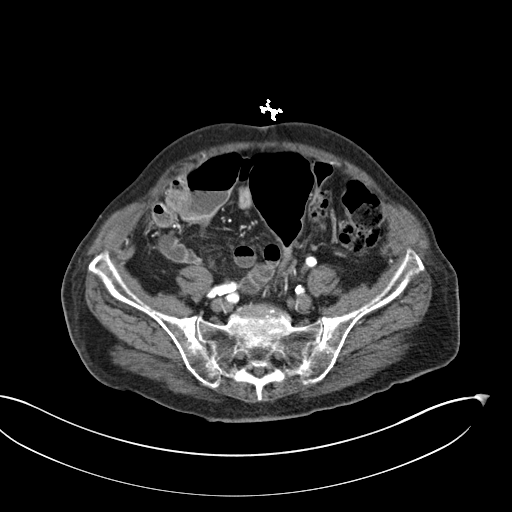
[im 40/93  soft-tissue]
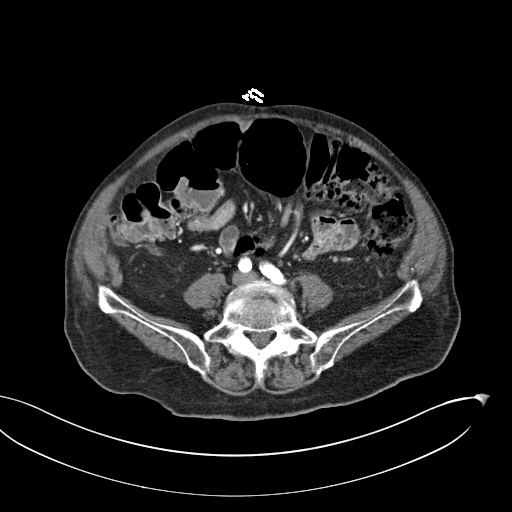
[im 47/93  soft-tissue]
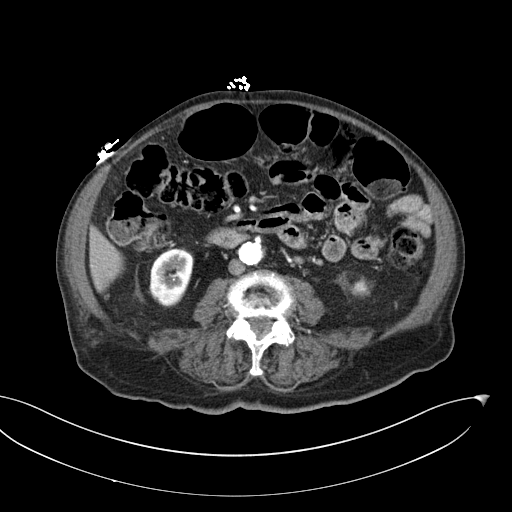
[im 53/93  soft-tissue]
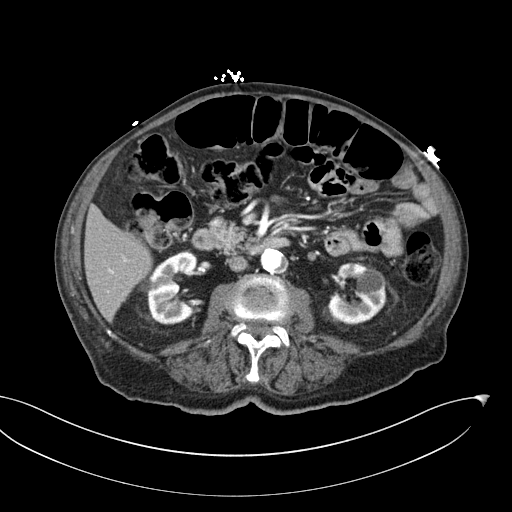
[im 60/93  soft-tissue]
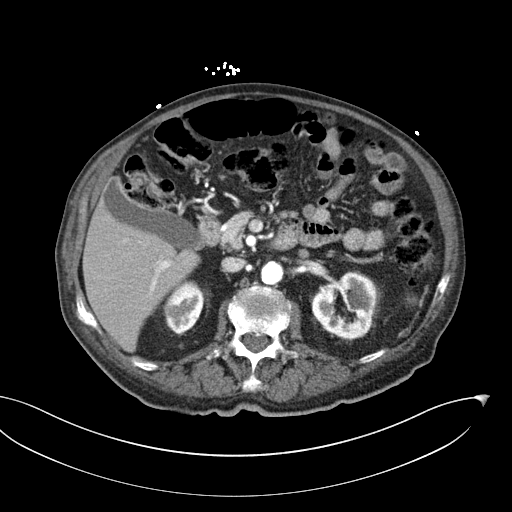
[im 60/93  bone]
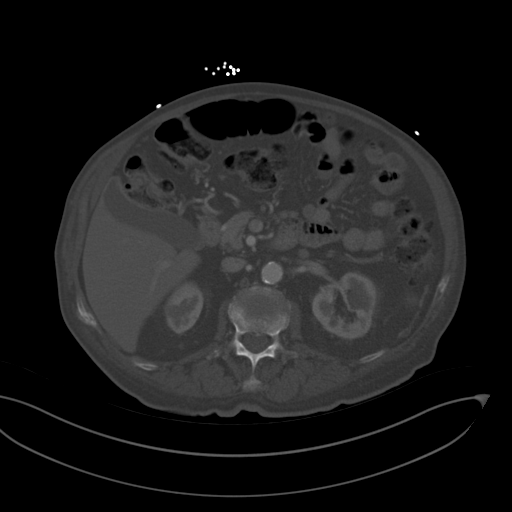
[im 66/93  soft-tissue]
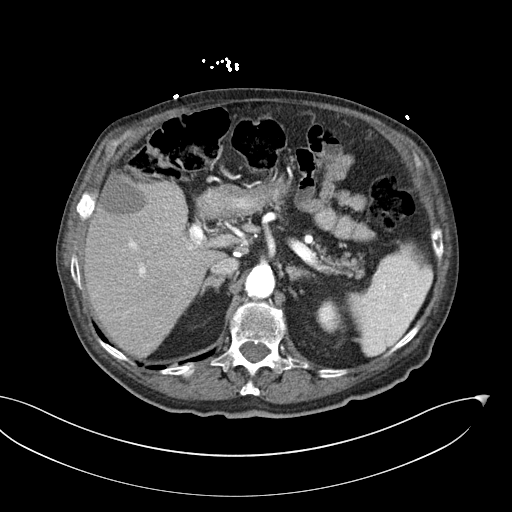
[im 73/93  soft-tissue]
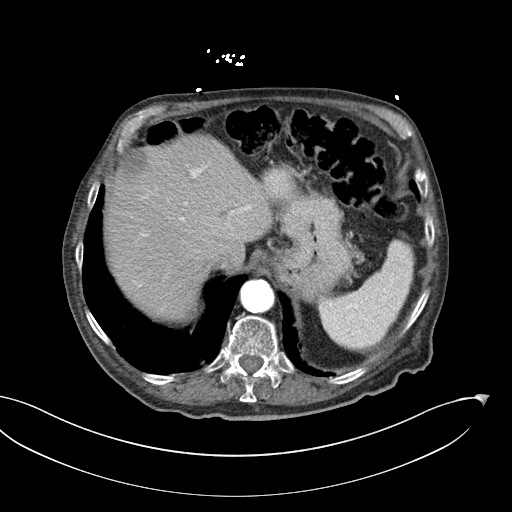
[im 79/93  soft-tissue]
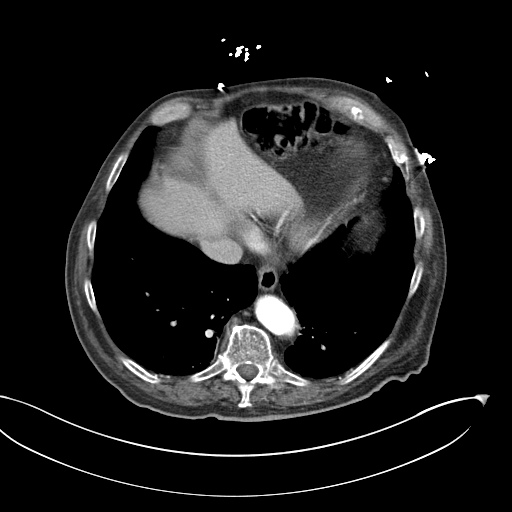
[im 86/93  soft-tissue]
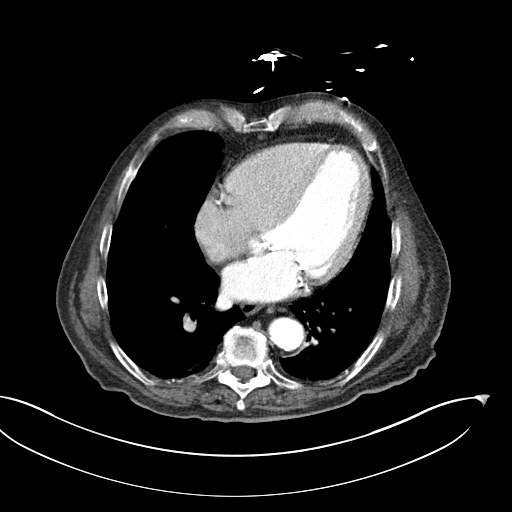

[Series 5: coronal st · coronal · 0.71mm/px · 3 of 101 slices shown]
[im 34/101  soft-tissue]
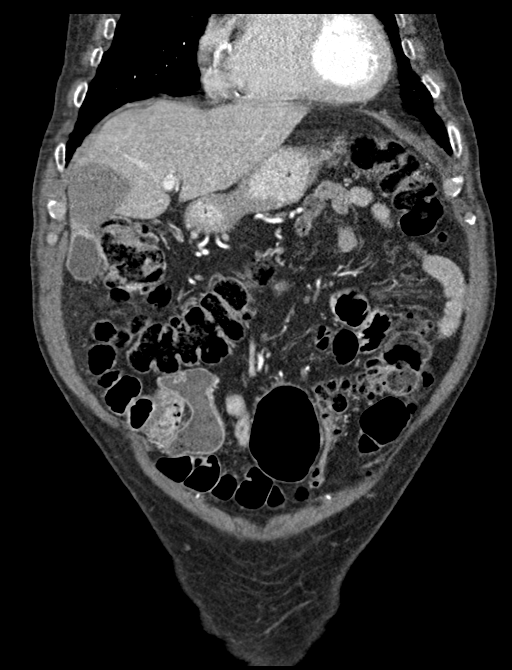
[im 45/101  soft-tissue]
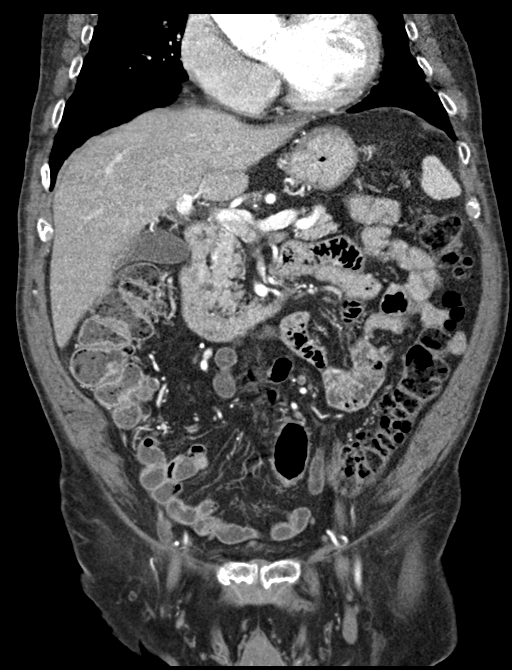
[im 56/101  soft-tissue]
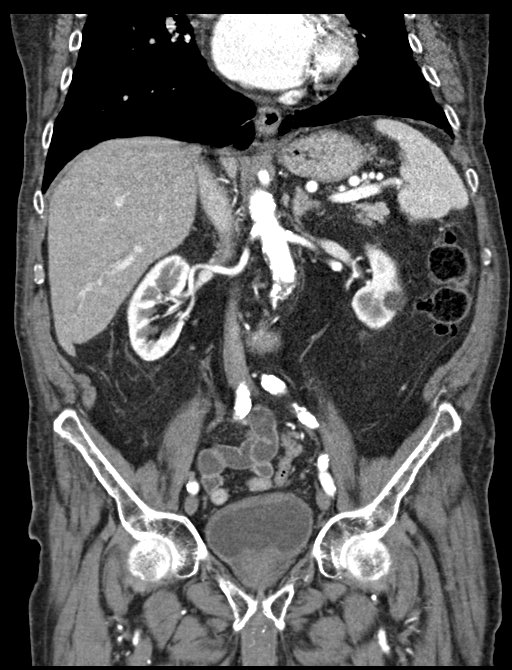

[16 of 46 positions shown; findings below may reference images not displayed]

FINDINGS: Lower chest: There are subacute fractures of the posterior aspect of
the right sixth and twelfth ribs and a subtle fracture of the
anterolateral aspect of the right fifth rib. There are older
appearing fractures of lateral aspects of the right eighth through
eleventh ribs. Calcification in the aortic valve and mitral valve
annulus. Aortic calcification. Overall heart size is normal. No
pericardial effusion. Slight bronchiectasis at the lung bases
posteriorly.

Hepatobiliary: There is a 7.4 cm cyst in the periphery of the right
lobe of the liver, unchanged since the prior study. Liver parenchyma
is otherwise normal. Biliary tree is normal.

Pancreas: Unremarkable. No pancreatic ductal dilatation or
surrounding inflammatory changes.

Spleen: Normal in size without focal abnormality.

Adrenals/Urinary Tract: Multiple small benign-appearing bilateral
renal cysts. No hydronephrosis. Bladder is normal.

Stomach/Bowel: Redundant but otherwise normal appearing colon.
Terminal ileum and appendix are normal. Stomach and small bowel are
normal.

Vascular/Lymphatic: Aortic atherosclerosis. No enlarged abdominal or
pelvic lymph nodes.

Reproductive: Prostate is unremarkable.

Other: No abdominal wall hernia or abnormality. No abdominopelvic
ascites.

Musculoskeletal: Old compression fracture of the inferior endplate
of L1. Degenerative disc and joint disease in the lower lumbar
spine. No acute abnormalities.
IMPRESSION: 1. Probable subacute fractures of the right fifth, sixth, and
twelfth ribs. Probable old fractures of the right eighth through
eleventh ribs.
2. No acute abnormality of the abdomen or pelvis.
3.  Aortic Atherosclerosis ([id number]).
